# Patient Record
Sex: Female | Born: 1955 | Race: White | Hispanic: No | Marital: Married | Smoking: Current every day smoker
Health system: Southern US, Community
[De-identification: ages and names within clinical notes are randomized; demographics above are authoritative.]

## PROBLEM LIST (undated history)

## (undated) DIAGNOSIS — I10 Essential (primary) hypertension: Secondary | ICD-10-CM

## (undated) DIAGNOSIS — I82409 Acute embolism and thrombosis of unspecified deep veins of unspecified lower extremity: Secondary | ICD-10-CM

## (undated) DIAGNOSIS — F32A Depression, unspecified: Secondary | ICD-10-CM

## (undated) DIAGNOSIS — IMO0002 Reserved for concepts with insufficient information to code with codable children: Secondary | ICD-10-CM

## (undated) DIAGNOSIS — E079 Disorder of thyroid, unspecified: Secondary | ICD-10-CM

## (undated) DIAGNOSIS — I6529 Occlusion and stenosis of unspecified carotid artery: Secondary | ICD-10-CM

## (undated) DIAGNOSIS — F329 Major depressive disorder, single episode, unspecified: Secondary | ICD-10-CM

## (undated) HISTORY — DX: Acute embolism and thrombosis of unspecified deep veins of unspecified lower extremity: I82.409

## (undated) HISTORY — PX: DILATION AND CURETTAGE OF UTERUS: SHX78

## (undated) HISTORY — DX: Occlusion and stenosis of unspecified carotid artery: I65.29

---

## 2000-10-21 ENCOUNTER — Ambulatory Visit (HOSPITAL_COMMUNITY): Admission: RE | Admit: 2000-10-21 | Discharge: 2000-10-21 | Payer: Self-pay | Admitting: *Deleted

## 2001-03-05 ENCOUNTER — Emergency Department (HOSPITAL_COMMUNITY): Admission: EM | Admit: 2001-03-05 | Discharge: 2001-03-05 | Payer: Self-pay | Admitting: Emergency Medicine

## 2001-03-10 ENCOUNTER — Ambulatory Visit (HOSPITAL_COMMUNITY): Admission: RE | Admit: 2001-03-10 | Discharge: 2001-03-10 | Payer: Self-pay | Admitting: Cardiology

## 2001-05-21 ENCOUNTER — Ambulatory Visit (HOSPITAL_COMMUNITY): Admission: RE | Admit: 2001-05-21 | Discharge: 2001-05-21 | Payer: Self-pay

## 2001-10-06 ENCOUNTER — Ambulatory Visit (HOSPITAL_COMMUNITY): Admission: RE | Admit: 2001-10-06 | Discharge: 2001-10-06 | Payer: Self-pay | Admitting: Pulmonary Disease

## 2002-03-16 ENCOUNTER — Ambulatory Visit (HOSPITAL_COMMUNITY): Admission: RE | Admit: 2002-03-16 | Discharge: 2002-03-16 | Payer: Self-pay | Admitting: Pulmonary Disease

## 2002-03-25 ENCOUNTER — Encounter (HOSPITAL_COMMUNITY): Admission: RE | Admit: 2002-03-25 | Discharge: 2002-04-24 | Payer: Self-pay | Admitting: Pulmonary Disease

## 2003-01-22 ENCOUNTER — Ambulatory Visit (HOSPITAL_COMMUNITY): Admission: RE | Admit: 2003-01-22 | Discharge: 2003-01-22 | Payer: Self-pay | Admitting: Pulmonary Disease

## 2003-01-23 HISTORY — PX: SHOULDER ARTHROSCOPY: SHX128

## 2003-02-08 ENCOUNTER — Ambulatory Visit (HOSPITAL_COMMUNITY): Admission: RE | Admit: 2003-02-08 | Discharge: 2003-02-08 | Payer: Self-pay | Admitting: Orthopedic Surgery

## 2003-03-03 ENCOUNTER — Ambulatory Visit (HOSPITAL_COMMUNITY): Admission: RE | Admit: 2003-03-03 | Discharge: 2003-03-03 | Payer: Self-pay | Admitting: Orthopedic Surgery

## 2003-05-25 ENCOUNTER — Ambulatory Visit (HOSPITAL_COMMUNITY): Admission: RE | Admit: 2003-05-25 | Discharge: 2003-05-25 | Payer: Self-pay | Admitting: Orthopedic Surgery

## 2003-05-28 ENCOUNTER — Encounter (HOSPITAL_COMMUNITY): Admission: RE | Admit: 2003-05-28 | Discharge: 2003-06-27 | Payer: Self-pay | Admitting: Orthopedic Surgery

## 2003-07-01 ENCOUNTER — Encounter (HOSPITAL_COMMUNITY): Admission: RE | Admit: 2003-07-01 | Discharge: 2003-07-31 | Payer: Self-pay | Admitting: Orthopedic Surgery

## 2004-07-10 ENCOUNTER — Ambulatory Visit: Payer: Self-pay | Admitting: Orthopedic Surgery

## 2005-04-30 ENCOUNTER — Ambulatory Visit (HOSPITAL_COMMUNITY): Admission: RE | Admit: 2005-04-30 | Discharge: 2005-04-30 | Payer: Self-pay | Admitting: Internal Medicine

## 2005-11-06 ENCOUNTER — Ambulatory Visit: Payer: Self-pay | Admitting: Gastroenterology

## 2005-11-06 ENCOUNTER — Ambulatory Visit (HOSPITAL_COMMUNITY): Admission: RE | Admit: 2005-11-06 | Discharge: 2005-11-06 | Payer: Self-pay | Admitting: Gastroenterology

## 2006-01-22 DIAGNOSIS — I82409 Acute embolism and thrombosis of unspecified deep veins of unspecified lower extremity: Secondary | ICD-10-CM

## 2006-01-22 HISTORY — DX: Acute embolism and thrombosis of unspecified deep veins of unspecified lower extremity: I82.409

## 2009-11-28 ENCOUNTER — Encounter: Payer: Self-pay | Admitting: Orthopedic Surgery

## 2010-02-21 NOTE — Letter (Signed)
Summary: Medical records request Disability Determ  Medical records request Disability Determ   Imported By: Ihor Austin 12/21/2009 11:43:42  _____________________________________________________________________  External Attachment:    Type:   Image     Comment:   External Document

## 2011-01-18 ENCOUNTER — Other Ambulatory Visit (HOSPITAL_COMMUNITY): Payer: Self-pay | Admitting: Internal Medicine

## 2011-01-18 DIAGNOSIS — R319 Hematuria, unspecified: Secondary | ICD-10-CM

## 2011-01-22 ENCOUNTER — Other Ambulatory Visit (HOSPITAL_COMMUNITY): Payer: Self-pay

## 2011-04-03 ENCOUNTER — Ambulatory Visit: Payer: Self-pay | Admitting: Urology

## 2011-05-29 ENCOUNTER — Ambulatory Visit: Payer: Self-pay | Admitting: Urology

## 2013-09-07 ENCOUNTER — Other Ambulatory Visit (HOSPITAL_COMMUNITY): Payer: Self-pay | Admitting: Internal Medicine

## 2013-09-07 DIAGNOSIS — Z86718 Personal history of other venous thrombosis and embolism: Secondary | ICD-10-CM

## 2013-09-07 DIAGNOSIS — Z8679 Personal history of other diseases of the circulatory system: Secondary | ICD-10-CM

## 2013-09-11 ENCOUNTER — Emergency Department (HOSPITAL_COMMUNITY)
Admission: EM | Admit: 2013-09-11 | Discharge: 2013-09-11 | Disposition: A | Discharge status: Home / Self Care | Payer: Medicare Other | Attending: Emergency Medicine | Admitting: Emergency Medicine

## 2013-09-11 ENCOUNTER — Ambulatory Visit (HOSPITAL_COMMUNITY)
Admission: RE | Admit: 2013-09-11 | Discharge: 2013-09-11 | Disposition: A | Discharge status: Home / Self Care | Payer: Medicare Other | Source: Ambulatory Visit | Attending: Internal Medicine | Admitting: Internal Medicine

## 2013-09-11 ENCOUNTER — Encounter (HOSPITAL_COMMUNITY): Payer: Self-pay | Admitting: Emergency Medicine

## 2013-09-11 DIAGNOSIS — Z8489 Family history of other specified conditions: Secondary | ICD-10-CM | POA: Diagnosis not present

## 2013-09-11 DIAGNOSIS — Z8679 Personal history of other diseases of the circulatory system: Secondary | ICD-10-CM

## 2013-09-11 DIAGNOSIS — R109 Unspecified abdominal pain: Secondary | ICD-10-CM | POA: Insufficient documentation

## 2013-09-11 DIAGNOSIS — M51379 Other intervertebral disc degeneration, lumbosacral region without mention of lumbar back pain or lower extremity pain: Secondary | ICD-10-CM | POA: Insufficient documentation

## 2013-09-11 DIAGNOSIS — F3289 Other specified depressive episodes: Secondary | ICD-10-CM | POA: Insufficient documentation

## 2013-09-11 DIAGNOSIS — F329 Major depressive disorder, single episode, unspecified: Secondary | ICD-10-CM | POA: Diagnosis not present

## 2013-09-11 DIAGNOSIS — I10 Essential (primary) hypertension: Secondary | ICD-10-CM | POA: Insufficient documentation

## 2013-09-11 DIAGNOSIS — Z8639 Personal history of other endocrine, nutritional and metabolic disease: Secondary | ICD-10-CM | POA: Diagnosis not present

## 2013-09-11 DIAGNOSIS — Z86718 Personal history of other venous thrombosis and embolism: Secondary | ICD-10-CM

## 2013-09-11 DIAGNOSIS — M79609 Pain in unspecified limb: Secondary | ICD-10-CM | POA: Diagnosis not present

## 2013-09-11 DIAGNOSIS — I6529 Occlusion and stenosis of unspecified carotid artery: Secondary | ICD-10-CM | POA: Diagnosis present

## 2013-09-11 DIAGNOSIS — Z87891 Personal history of nicotine dependence: Secondary | ICD-10-CM | POA: Insufficient documentation

## 2013-09-11 DIAGNOSIS — M5137 Other intervertebral disc degeneration, lumbosacral region: Secondary | ICD-10-CM | POA: Insufficient documentation

## 2013-09-11 DIAGNOSIS — E785 Hyperlipidemia, unspecified: Secondary | ICD-10-CM | POA: Insufficient documentation

## 2013-09-11 DIAGNOSIS — Z792 Long term (current) use of antibiotics: Secondary | ICD-10-CM | POA: Diagnosis not present

## 2013-09-11 DIAGNOSIS — Z79899 Other long term (current) drug therapy: Secondary | ICD-10-CM | POA: Diagnosis not present

## 2013-09-11 DIAGNOSIS — F17200 Nicotine dependence, unspecified, uncomplicated: Secondary | ICD-10-CM | POA: Diagnosis not present

## 2013-09-11 DIAGNOSIS — M51369 Other intervertebral disc degeneration, lumbar region without mention of lumbar back pain or lower extremity pain: Secondary | ICD-10-CM

## 2013-09-11 DIAGNOSIS — I7090 Unspecified atherosclerosis: Secondary | ICD-10-CM | POA: Diagnosis not present

## 2013-09-11 DIAGNOSIS — M5136 Other intervertebral disc degeneration, lumbar region: Secondary | ICD-10-CM

## 2013-09-11 DIAGNOSIS — Z862 Personal history of diseases of the blood and blood-forming organs and certain disorders involving the immune mechanism: Secondary | ICD-10-CM | POA: Diagnosis not present

## 2013-09-11 DIAGNOSIS — M79652 Pain in left thigh: Secondary | ICD-10-CM

## 2013-09-11 HISTORY — DX: Essential (primary) hypertension: I10

## 2013-09-11 HISTORY — DX: Reserved for concepts with insufficient information to code with codable children: IMO0002

## 2013-09-11 HISTORY — DX: Disorder of thyroid, unspecified: E07.9

## 2013-09-11 HISTORY — DX: Major depressive disorder, single episode, unspecified: F32.9

## 2013-09-11 HISTORY — DX: Depression, unspecified: F32.A

## 2013-09-11 MED ORDER — HYDROCODONE-ACETAMINOPHEN 5-325 MG PO TABS
2.0000 | ORAL_TABLET | Freq: Once | ORAL | Status: AC
  Administered 2013-09-11: 2 via ORAL
  Filled 2013-09-11: qty 2

## 2013-09-11 MED ORDER — DEXAMETHASONE 6 MG PO TABS: ORAL_TABLET | ORAL | Status: DC

## 2013-09-11 MED ORDER — METHOCARBAMOL 500 MG PO TABS: 500.0000 mg | ORAL_TABLET | Freq: Four times a day (QID) | ORAL | Status: AC

## 2013-09-11 MED ORDER — KETOROLAC TROMETHAMINE 10 MG PO TABS
10.0000 mg | ORAL_TABLET | Freq: Once | ORAL | Status: AC
  Administered 2013-09-11: 10 mg via ORAL
  Filled 2013-09-11: qty 1

## 2013-09-11 MED ORDER — DIAZEPAM 5 MG PO TABS
10.0000 mg | ORAL_TABLET | Freq: Once | ORAL | Status: AC
  Administered 2013-09-11: 10 mg via ORAL
  Filled 2013-09-11: qty 2

## 2013-09-11 MED ORDER — DEXAMETHASONE SODIUM PHOSPHATE 4 MG/ML IJ SOLN
8.0000 mg | Freq: Once | INTRAMUSCULAR | Status: AC
  Administered 2013-09-11: 8 mg via INTRAMUSCULAR
  Filled 2013-09-11: qty 2

## 2013-09-11 NOTE — Discharge Instructions (Signed)
Please alternate heat and ice to your lower back. Please use Decadron 2 times daily with food. Please use Robaxin with breakfast, lunch, dinner and at bedtime for fasciculations and muscle spasms. Please continue your hydrocodone for pain if needed. Please continue your meloxicam as ordered by your physician. Please see your physician next week for recheck and evaluation concerning your by and leg area pain.

## 2013-09-11 NOTE — ED Provider Notes (Signed)
Medical screening examination/treatment/procedure(s) were performed by non-physician practitioner and as supervising physician I was immediately available for consultation/collaboration.     Veryl Speak, MD 09/11/13 2236

## 2013-09-11 NOTE — ED Notes (Signed)
Pain to left groin, shooting down left leg starting today.  Denies injury/strenuous exercise.

## 2013-09-11 NOTE — ED Notes (Signed)
Patient with no complaints at this time. Respirations even and unlabored. Skin warm/dry. Discharge instructions reviewed with patient at this time. Patient given opportunity to voice concerns/ask questions. Patient discharged at this time and left Emergency Department with steady gait.   

## 2013-09-11 NOTE — ED Provider Notes (Signed)
CSN: [id number]     Arrival date & time 09/11/13  1448 History   First MD Initiated Contact with Patient 09/11/13 1609     Chief Complaint  Patient presents with  . Groin Pain     (Consider location/radiation/quality/duration/timing/severity/associated sxs/prior Treatment) HPI Comments: Patient is a 58 year old female who presents to the emergency department with left groin pain. The patient states that she has a history of degenerative disc disease involving her back. She states that she has fasciculations on the sides of her legs from time to time. Today she had ultrasounds done of her neck and also of her abdomen. She states that after she got home she started having severe pain in the left groin that was shooting down the left leg. She denies any recent injury or trauma to the groin or to the leg or to the back. She's not had any high fevers. She's not had any loss of bowel or bladder function to be reported. There has been no recent falls. The patient states that she had hydrocodone earlier during the day as well as Goody powders, and these did not help. She presents to the emergency department for additional evaluation of these problems.  Patient is a 58 y.o. female presenting with groin pain. The history is provided by the patient.  Groin Pain This is a new problem. Associated symptoms include arthralgias. Pertinent negatives include no abdominal pain, chest pain, coughing or neck pain.    Past Medical History  Diagnosis Date  . Hypertension   . Thyroid disease   . DDD (degenerative disc disease)   . Depression    Past Surgical History  Procedure Laterality Date  . Cesarean section    . Dilation and curettage of uterus     No family history on file. History  Substance Use Topics  . Smoking status: Current Every Day Smoker    Types: Cigarettes  . Smokeless tobacco: Not on file  . Alcohol Use: No   OB History   Grav Para Term Preterm Abortions TAB SAB Ect Mult Living                  Review of Systems  Constitutional: Negative for activity change.       All ROS Neg except as noted in HPI  HENT: Negative for nosebleeds.   Eyes: Negative for photophobia and discharge.  Respiratory: Negative for cough, shortness of breath and wheezing.   Cardiovascular: Negative for chest pain and palpitations.  Gastrointestinal: Negative for abdominal pain and blood in stool.  Genitourinary: Negative for dysuria, frequency and hematuria.  Musculoskeletal: Positive for arthralgias and back pain. Negative for neck pain.  Skin: Negative.   Neurological: Negative for dizziness, seizures and speech difficulty.  Psychiatric/Behavioral: Negative for hallucinations and confusion.      Allergies  Review of patient's allergies indicates no known allergies.  Home Medications   Prior to Admission medications   Medication Sig Start Date End Date Taking? Authorizing Provider  aspirin EC 81 MG tablet Take 81 mg by mouth daily.   Yes Historical Provider, MD  Aspirin-Acetaminophen-Caffeine (GOODY HEADACHE PO) Take 1 packet by mouth as needed (for pain).   Yes Historical Provider, MD  buPROPion (WELLBUTRIN XL) 150 MG 24 hr tablet Take 150 mg by mouth daily.   Yes Historical Provider, MD  diazepam (VALIUM) 10 MG tablet Take 10 mg by mouth every 6 (six) hours as needed for anxiety or sleep.  07/20/13  Yes Historical Provider, MD  HYDROcodone-acetaminophen (  NORCO) 10-325 MG per tablet Take 1 tablet by mouth every 4 (four) hours as needed. For pain 09/07/13  Yes Historical Provider, MD  metoprolol tartrate (LOPRESSOR) 25 MG tablet Take 12.5 mg by mouth daily.   Yes Historical Provider, MD  SYNTHROID 112 MCG tablet Take 112 mcg by mouth daily. 08/11/13  Yes Historical Provider, MD  azithromycin (ZITHROMAX) 250 MG tablet Take 250-500 mg by mouth See admin instructions. Take two tablets on day 1 then take one tablet on days 1 through 5. 09/08/13   Historical Provider, MD   BP 139/88  Pulse 70   Temp(Src) 99.1 F (37.3 C) (Oral)  Resp 18  Ht 5\' 3"  (1.6 m)  Wt 123 lb (55.792 kg)  BMI 21.79 kg/m2  SpO2 100% Physical Exam  Nursing note and vitals reviewed. Constitutional: She is oriented to person, place, and time. She appears well-developed and well-nourished.  Non-toxic appearance.  HENT:  Head: Normocephalic.  Right Ear: Tympanic membrane and external ear normal.  Left Ear: Tympanic membrane and external ear normal.  Eyes: EOM and lids are normal. Pupils are equal, round, and reactive to light.  Neck: Normal range of motion. Neck supple. Carotid bruit is not present.  Cardiovascular: Normal rate, regular rhythm, normal heart sounds, intact distal pulses and normal pulses.   Pulmonary/Chest: Breath sounds normal. No respiratory distress.  Abdominal: Soft. Bowel sounds are normal. There is no tenderness. There is no guarding.  Musculoskeletal: Normal range of motion.  There is pain of the lumbar spine to palpation and with change of positions. No hot areas appreciated.  There is soreness with attempted range of motion of the left hip. There is crepitus with range of motion of right and left knees. The dorsalis pedis pulses 2+. The capillary refill is less than 2 seconds bilaterally. There is a negative Homans sign. There no temperature changes from the toes to the thigh area.  Lymphadenopathy:       Head (right side): No submandibular adenopathy present.       Head (left side): No submandibular adenopathy present.    She has no cervical adenopathy.  Neurological: She is alert and oriented to person, place, and time. She has normal strength. No cranial nerve deficit or sensory deficit.  Gait is steady. There is no evidence of foot drop. No motor or sensory deficits appreciated of the lower extremities.  Skin: Skin is warm and dry.  Psychiatric: She has a normal mood and affect. Her speech is normal.    ED Course  Procedures (including critical care time) Labs Review Labs  Reviewed - No data to display  Imaging Review US Carotid Bilateral  09/11/2013   CLINICAL DATA:  Carotid artery disease, hypertension, hyperlipidemia and tobacco use.  EXAM: BILATERAL CAROTID DUPLEX ULTRASOUND  TECHNIQUE: Pearline Cables scale imaging, color Doppler and duplex ultrasound were performed of bilateral carotid and vertebral arteries in the neck.  COMPARISON:  None.  FINDINGS: Criteria: Quantification of carotid stenosis is based on velocity parameters that correlate the residual internal carotid diameter with NASCET-based stenosis levels, using the diameter of the distal internal carotid lumen as the denominator for stenosis measurement.  The following velocity measurements were obtained:  RIGHT  ICA:  139/44 cm/sec  CCA:  123XX123 cm/sec  SYSTOLIC ICA/CCA RATIO:  2.1  DIASTOLIC ICA/CCA RATIO:  2.0  ECA:  80 cm/sec  LEFT  ICA:  135/44 cm/sec  CCA:  [id number] cm/sec  SYSTOLIC ICA/CCA RATIO:  2.0  DIASTOLIC ICA/CCA RATIO:  1.4  ECA:  126 cm/sec  RIGHT CAROTID ARTERY: Moderate plaque present at the level of the distal common carotid artery, carotid bulb and proximal ICA. Common carotid and carotid bulb plaque is largely noncalcified and ICA plaque is predominantly calcified. There is some velocity elevation in the proximal ICA with spectral broadening present consistent with turbulence. Estimated right ICA stenosis is 50- 69%.  RIGHT VERTEBRAL ARTERY: Antegrade flow with normal waveform and velocity.  LEFT CAROTID ARTERY: Moderate partially calcified plaque is present at the level of the distal common carotid artery, carotid bulb and proximal ICA. Proximal ICA velocities are mildly elevated and there is evidence of turbulent flow and spectral broadening. Estimated left ICA stenosis is 50- 69%.  LEFT VERTEBRAL ARTERY: Antegrade flow with normal waveform and velocity.  IMPRESSION: Moderate atherosclerotic plaque at both carotid bifurcations. Estimated bilateral ICA stenoses are 50- 69%.   Electronically Signed   By: Aletta Edouard M.D.   On: 09/11/2013 10:34   US Aorta  09/11/2013   CLINICAL DATA:  History of smoking, hypertension, hyperlipidemia, carotid artery disease, familial history of abdominal aortic aneurysm.  EXAM: ULTRASOUND OF ABDOMINAL AORTA  TECHNIQUE: Ultrasound examination of the abdominal aorta was performed to evaluate for abdominal aortic aneurysm.  COMPARISON:  None.  FINDINGS: Abdominal Aorta  There is a minimal amount of eccentric mixed echogenic atherosclerotic plaque within a normal caliber abdominal aorta with measurements as follows:  Proximal:  1.9 x 2.3 cm.  Mid:  1.5 x 1.7 cm.  Distal:  1.6 x 1.3 cm.  The bilateral common iliac arteries of of normal size, the right measuring approximately 1.2 cm in maximal diameter, the left measuring approximately 1.0 cm.  IMPRESSION: Minimal amount of atherosclerotic plaque within a normal caliber abdominal aorta.   Electronically Signed   By: Sandi Mariscal M.D.   On: 09/11/2013 09:50     EKG Interpretation None      MDM I have reviewed the ultrasound of the abdominal aorta as well as the carotid arteries. There is bilateral atherosclerotic plaque at the carotid bifurcations. There is 50-69% estimated stenosis present.  There is a small amount of atherosclerotic plaque in the abdominal aorta, but no evidence of aneurysmal changes.  Temperature is 99.1, otherwise the vital signs are well within normal limits. The pulse oximetry is 100% on room air.  There is no edema present. No hot joints appreciated. Is no evidence for any dislocations. No evidence for any troubles. I suspect the patient is having an exacerbation of her degenerative disc disease. The patient be treated in the emergency department with Toradol, diazepam, and Decadron. The patient has hydrocodone at home, will add muscle relaxant to this. The patient has anti-inflammatory medications but she is taking at home already. Patient strongly advised to see her primary physician on next week.     Final diagnoses:  None    *I have reviewed nursing notes, vital signs, and all appropriate lab and imaging results for this patient.Lenox Ahr, PA-C 09/11/13 1644

## 2013-10-26 ENCOUNTER — Encounter: Payer: Self-pay | Admitting: Vascular Surgery

## 2013-10-27 ENCOUNTER — Encounter: Payer: Self-pay | Admitting: Vascular Surgery

## 2013-10-27 ENCOUNTER — Ambulatory Visit (INDEPENDENT_AMBULATORY_CARE_PROVIDER_SITE_OTHER): Payer: Medicare Other | Admitting: Vascular Surgery

## 2013-10-27 VITALS — BP 133/81 | HR 74 | Resp 18 | Ht 62.25 in | Wt 127.5 lb

## 2013-10-27 DIAGNOSIS — I6529 Occlusion and stenosis of unspecified carotid artery: Secondary | ICD-10-CM

## 2013-10-27 NOTE — Progress Notes (Signed)
Patient name: Samantha Osborne MRN: [id number] DOB: 12-Jul-1955 Sex: female   Referred by: Gerarda Fraction  Reason for referral:  Chief Complaint  Patient presents with  . New Evaluation    referred by Dr. Gerarda Fraction  . Carotid    HISTORY OF PRESENT ILLNESS: Patient is a very pleasant 58 year old female seen today for discussion of asymptomatic carotid stenosis. She underwent a lifeline screening certainly testing some stenosis. He recently underwent repeat study on 09/11/2013 and is here today for discussion of this. She specifically denies any episodes of amaurosis fugax, transient ischemic attack or stroke. He does have severe history of premature cardiac disease with a brother dying in his 69s and mother dying at stroke in her late 76s. She has no history of coronary artery disease. Does have a history of DVT. She unfortunately to smoke 1 pack cigarettes per day. She is here today with her husband  Past Medical History  Diagnosis Date  . Hypertension   . Thyroid disease   . DDD (degenerative disc disease)   . Depression   . Carotid artery occlusion   . DVT (deep venous thrombosis) 2008    right leg    Past Surgical History  Procedure Laterality Date  . Cesarean section    . Dilation and curettage of uterus    . Shoulder arthroscopy Left 2005    History   Social History  . Marital Status: Married    Spouse Name: N/A    Number of Children: N/A  . Years of Education: N/A   Occupational History  . Not on file.   Social History Main Topics  . Smoking status: Current Every Day Smoker -- 1.00 packs/day    Types: Cigarettes  . Smokeless tobacco: Not on file  . Alcohol Use: No  . Drug Use: No  . Sexual Activity: Not on file   Other Topics Concern  . Not on file   Social History Narrative  . No narrative on file    Family History  Problem Relation Age of Onset  . Heart disease Mother   . Hyperlipidemia Mother   . Heart attack Mother   . AAA (abdominal aortic aneurysm)  Mother   . Cancer Father   . Heart disease Brother   . Hypertension Brother   . Hypertension Son     Allergies as of 10/27/2013  . (No Known Allergies)    Current Outpatient Prescriptions on File Prior to Visit  Medication Sig Dispense Refill  . aspirin EC 81 MG tablet Take 81 mg by mouth daily.      . Aspirin-Acetaminophen-Caffeine (GOODY HEADACHE PO) Take 1 packet by mouth as needed (for pain).      Marland Kitchen buPROPion (WELLBUTRIN XL) 150 MG 24 hr tablet Take 150 mg by mouth daily.      . diazepam (VALIUM) 10 MG tablet Take 10 mg by mouth every 6 (six) hours as needed for anxiety or sleep.       Marland Kitchen HYDROcodone-acetaminophen (NORCO) 10-325 MG per tablet Take 1 tablet by mouth every 4 (four) hours as needed. For pain      . methocarbamol (ROBAXIN) 500 MG tablet Take 1 tablet (500 mg total) by mouth 4 (four) times daily.  28 tablet  0  . metoprolol tartrate (LOPRESSOR) 25 MG tablet Take 12.5 mg by mouth daily.      Marland Kitchen SYNTHROID 112 MCG tablet Take 112 mcg by mouth daily.      Marland Kitchen azithromycin (ZITHROMAX) 250 MG  tablet Take 250-500 mg by mouth See admin instructions. Take two tablets on day 1 then take one tablet on days 1 through 5.      . dexamethasone (DECADRON) 6 MG tablet 1 po bid with food  12 tablet  0   No current facility-administered medications on file prior to visit.     REVIEW OF SYSTEMS:  Positives indicated with an "X"  CARDIOVASCULAR:  [ ]  chest pain   [ ]  chest pressure   [ ]  palpitations   [ ]  orthopnea   [ ]  dyspnea on exertion   [ ]  claudication   [ ]  rest pain   [x ] DVT   [ ]  phlebitis PULMONARY:   [ ]  productive cough   [ ]  asthma   [ ]  wheezing NEUROLOGIC:   [x ] weakness  [x ] paresthesias  [ ]  aphasia  [ ]  amaurosis  [x ] dizziness HEMATOLOGIC:   [ ]  bleeding problems   [ ]  clotting disorders MUSCULOSKELETAL:  [ ]  joint pain   [ ]  joint swelling GASTROINTESTINAL: [ ]   blood in stool  [ ]   hematemesis GENITOURINARY:  [ ]   dysuria  [x ]  hematuria PSYCHIATRIC:  [ ]   history of major depression INTEGUMENTARY:  [ ]  rashes  [ ]  ulcers CONSTITUTIONAL:  [ ]  fever   [ ]  chills  PHYSICAL EXAMINATION:  General: The patient is a well-nourished female, in no acute distress. Vital signs are BP 133/81  Pulse 74  Resp 18  Ht 5' 2.25" (1.581 m)  Wt 127 lb 8 oz (57.834 kg)  BMI 23.14 kg/m2 Pulmonary: There is a good air exchange bilaterally without wheezing or rales. Abdomen: Soft and non-tender  Musculoskeletal: There are no major deformities.  There is no significant extremity pain. Neurologic: No focal weakness or paresthesias are detected, Skin: There are no ulcer or rashes noted. Psychiatric: The patient has normal affect. Cardiovascular: There is a regular rate and rhythm without significant murmur appreciated. Carotid arteries without bruits bilaterally Pulse status: 2+ radial femoral and dorsalis pedis pulses bilaterally   Vascular Lab Studies:  Carotid duplex from 09/11/2013 was reviewed. This does suggest bilateral 50-69% stenosis. Antegrade flow in the vertebral arteries bilaterally  Impression and Plan:  Had a long discussion with the patient and her husband present. I did explain that this is the lower end of the scale related to her velocities in the internal carotid arteries bilaterally.-As the posterior at no increased risk for stroke currently. I did recommend a followup in 6 months to rule out any progression of stenosis. I also explained to critical importance of absolute cigarette smoking cessation. Explained about spondylitis was removed correctable risk factor particularly in light of her strong family history. She will attempt this. We will see her again in 6 months. I did review signs of carotid disease symptoms and she will notify should this occur.    Django Nguyen [facility] Office: [phone]

## 2013-10-28 ENCOUNTER — Other Ambulatory Visit: Payer: Self-pay | Admitting: *Deleted

## 2013-10-28 DIAGNOSIS — I6523 Occlusion and stenosis of bilateral carotid arteries: Secondary | ICD-10-CM

## 2014-05-04 ENCOUNTER — Ambulatory Visit: Payer: Medicare Other | Admitting: Vascular Surgery

## 2014-05-04 ENCOUNTER — Other Ambulatory Visit (HOSPITAL_COMMUNITY): Payer: Medicare Other

## 2014-05-18 ENCOUNTER — Ambulatory Visit: Payer: Self-pay | Admitting: Vascular Surgery

## 2014-05-18 ENCOUNTER — Other Ambulatory Visit (HOSPITAL_COMMUNITY): Payer: Self-pay

## 2014-06-18 ENCOUNTER — Encounter: Payer: Self-pay | Admitting: Vascular Surgery

## 2014-06-22 ENCOUNTER — Ambulatory Visit (INDEPENDENT_AMBULATORY_CARE_PROVIDER_SITE_OTHER): Payer: Medicare Other | Admitting: Vascular Surgery

## 2014-06-22 ENCOUNTER — Other Ambulatory Visit: Payer: Self-pay | Admitting: Vascular Surgery

## 2014-06-22 ENCOUNTER — Encounter: Payer: Self-pay | Admitting: Vascular Surgery

## 2014-06-22 ENCOUNTER — Ambulatory Visit (HOSPITAL_COMMUNITY)
Admission: RE | Admit: 2014-06-22 | Discharge: 2014-06-22 | Disposition: A | Discharge status: Home / Self Care | Payer: Medicare Other | Source: Ambulatory Visit | Attending: Vascular Surgery | Admitting: Vascular Surgery

## 2014-06-22 VITALS — BP 139/71 | HR 74 | Ht 62.5 in | Wt 127.0 lb

## 2014-06-22 DIAGNOSIS — I6529 Occlusion and stenosis of unspecified carotid artery: Secondary | ICD-10-CM | POA: Diagnosis not present

## 2014-06-22 DIAGNOSIS — I6523 Occlusion and stenosis of bilateral carotid arteries: Secondary | ICD-10-CM | POA: Diagnosis not present

## 2014-06-22 NOTE — Addendum Note (Signed)
Addended by: Mena Goes on: 06/22/2014 05:03 PM   Modules accepted: Orders

## 2014-06-22 NOTE — Progress Notes (Signed)
Patient resents today for follow-up of asymptomatic carotid stenosis. She is quite anxious regarding this. She feels that she may be more sleepy than typical this may be related. Explained that this would not cause sleepiness from her moderate carotid disease. She does have no symptoms of focal neurologic deficits. Specifically no amaurosis fugax, transient ischemic attack or stroke.  Unfortunately she continues to smoke. Again stressed the importance of smoking cessation.  Past Medical History  Diagnosis Date  . Hypertension   . Thyroid disease   . DDD (degenerative disc disease)   . Depression   . Carotid artery occlusion   . DVT (deep venous thrombosis) 2008    right leg    History  Substance Use Topics  . Smoking status: Current Every Day Smoker -- 1.00 packs/day    Types: Cigarettes  . Smokeless tobacco: Not on file  . Alcohol Use: No    Family History  Problem Relation Age of Onset  . Heart disease Mother   . Hyperlipidemia Mother   . Heart attack Mother   . AAA (abdominal aortic aneurysm) Mother   . Cancer Father   . Heart disease Brother   . Hypertension Brother   . Hypertension Son     No Known Allergies   Current outpatient prescriptions:  .  aspirin EC 81 MG tablet, Take 81 mg by mouth daily., Disp: , Rfl:  .  Aspirin-Acetaminophen-Caffeine (GOODY HEADACHE PO), Take 1 packet by mouth as needed (for pain)., Disp: , Rfl:  .  buPROPion (WELLBUTRIN XL) 150 MG 24 hr tablet, Take 150 mg by mouth daily., Disp: , Rfl:  .  diazepam (VALIUM) 10 MG tablet, Take 10 mg by mouth every 6 (six) hours as needed for anxiety or sleep. , Disp: , Rfl:  .  HYDROcodone-acetaminophen (NORCO) 10-325 MG per tablet, Take 1 tablet by mouth every 4 (four) hours as needed. For pain, Disp: , Rfl:  .  methocarbamol (ROBAXIN) 500 MG tablet, Take 1 tablet (500 mg total) by mouth 4 (four) times daily., Disp: 28 tablet, Rfl: 0 .  metoprolol tartrate (LOPRESSOR) 25 MG tablet, Take 25 mg by mouth  daily. , Disp: , Rfl:  .  SYNTHROID 112 MCG tablet, Take 112 mcg by mouth daily., Disp: , Rfl:  .  azithromycin (ZITHROMAX) 250 MG tablet, Take 250-500 mg by mouth See admin instructions. Take two tablets on day 1 then take one tablet on days 1 through 5., Disp: , Rfl:  .  dexamethasone (DECADRON) 6 MG tablet, 1 po bid with food (Patient not taking: Reported on 06/22/2014), Disp: 12 tablet, Rfl: 0  Filed Vitals:   06/22/14 1535 06/22/14 1539  BP: 146/80 139/71  Pulse: 74   Height: 5' 2.5" (1.588 m)   Weight: 127 lb (57.607 kg)   SpO2: 98%     Body mass index is 22.84 kg/(m^2).        Physical exam well-developed well-nourished female no acute distress  Grossly intact neurologically  Heart regular rate and rhythm  Carotid arteries without bruits bilaterally   She did undergo repeat  Carotid duplex in our office today. I discussed this with the patient and her husband present. This shows no change in the moderate stenosis in both internal carotid arteries. Her carotid Doppler velocities suggest 50-69% stenosis which is certainly at the lower end of this range.   Again reviewed the risk factors for increased atherosclerotic change. Explained cigarette smoking certainly must report one of these. Also explained the hypertension control  and lipid management. She once to quit smoking and continue to work towards this. We will see her again in one year with repeat carotid duplex

## 2014-09-28 ENCOUNTER — Other Ambulatory Visit (HOSPITAL_COMMUNITY): Payer: Self-pay | Admitting: Internal Medicine

## 2014-09-28 DIAGNOSIS — Z1231 Encounter for screening mammogram for malignant neoplasm of breast: Secondary | ICD-10-CM

## 2014-10-04 ENCOUNTER — Ambulatory Visit (HOSPITAL_COMMUNITY): Payer: Medicare Other

## 2014-10-14 ENCOUNTER — Ambulatory Visit (HOSPITAL_COMMUNITY)
Admission: RE | Admit: 2014-10-14 | Discharge: 2014-10-14 | Disposition: A | Discharge status: Home / Self Care | Payer: Medicare Other | Source: Ambulatory Visit | Attending: Internal Medicine | Admitting: Internal Medicine

## 2014-10-14 DIAGNOSIS — Z1231 Encounter for screening mammogram for malignant neoplasm of breast: Secondary | ICD-10-CM | POA: Insufficient documentation

## 2014-12-01 ENCOUNTER — Ambulatory Visit: Payer: Self-pay | Admitting: "Endocrinology

## 2014-12-03 ENCOUNTER — Encounter: Payer: Self-pay | Admitting: "Endocrinology

## 2014-12-03 ENCOUNTER — Ambulatory Visit (INDEPENDENT_AMBULATORY_CARE_PROVIDER_SITE_OTHER): Payer: Medicare Other | Admitting: "Endocrinology

## 2014-12-03 VITALS — BP 131/81 | HR 79 | Ht 62.5 in | Wt 130.0 lb

## 2014-12-03 DIAGNOSIS — E038 Other specified hypothyroidism: Secondary | ICD-10-CM | POA: Diagnosis not present

## 2014-12-03 DIAGNOSIS — E2740 Unspecified adrenocortical insufficiency: Secondary | ICD-10-CM

## 2014-12-04 NOTE — Progress Notes (Signed)
Subjective:    Patient ID: Samantha Osborne, female    DOB: May 01, 1955,    Past Medical History  Diagnosis Date  . Hypertension   . Thyroid disease   . DDD (degenerative disc disease)   . Depression   . Carotid artery occlusion   . DVT (deep venous thrombosis) ([place]) 2008    right leg   Past Surgical History  Procedure Laterality Date  . Cesarean section    . Dilation and curettage of uterus    . Shoulder arthroscopy Left 2005   Social History   Social History  . Marital Status: Married    Spouse Name: N/A  . Number of Children: N/A  . Years of Education: N/A   Social History Main Topics  . Smoking status: Current Every Day Smoker -- 1.00 packs/day    Types: Cigarettes  . Smokeless tobacco: None  . Alcohol Use: No  . Drug Use: No  . Sexual Activity: Not Asked   Other Topics Concern  . None   Social History Narrative   Outpatient Encounter Prescriptions as of 12/03/2014  Medication Sig  . aspirin EC 81 MG tablet Take 81 mg by mouth daily.  Marland Kitchen buPROPion (WELLBUTRIN XL) 150 MG 24 hr tablet Take 150 mg by mouth daily.  . diazepam (VALIUM) 10 MG tablet Take 10 mg by mouth every 6 (six) hours as needed for anxiety or sleep.   Marland Kitchen HYDROcodone-acetaminophen (NORCO) 10-325 MG per tablet Take 1 tablet by mouth every 4 (four) hours as needed. For pain  . methocarbamol (ROBAXIN) 500 MG tablet Take 1 tablet (500 mg total) by mouth 4 (four) times daily.  . metoprolol tartrate (LOPRESSOR) 25 MG tablet Take 25 mg by mouth daily.   . Omega-3 Fatty Acids (FISH OIL) 1000 MG CAPS Take by mouth daily.  Marland Kitchen SYNTHROID 112 MCG tablet Take 112 mcg by mouth daily.  . Aspirin-Acetaminophen-Caffeine (GOODY HEADACHE PO) Take 1 packet by mouth as needed (for pain).  Marland Kitchen azithromycin (ZITHROMAX) 250 MG tablet Take 250-500 mg by mouth See admin instructions. Take two tablets on day 1 then take one tablet on days 1 through 5.  . dexamethasone (DECADRON) 6 MG tablet 1 po bid with food (Patient not  taking: Reported on 06/22/2014)   No facility-administered encounter medications on file as of 12/03/2014.   ALLERGIES: No Known Allergies VACCINATION STATUS:  There is no immunization history on file for this patient.  HPI Samantha Osborne is a 58 year old female patient with medical history as above. She is being seen in consultation for hypo-cortisolemia requested by Dr. Gerarda Fraction. She gives a history of chronic exposure to steroids for various reasons including arthritis, at one point as high as Decadron 6 mg by mouth daily. She also has history of hyperthyroidism treated with RAI approximately 20 years ago currently on Synthroid 112 g by mouth every morning. She is a chronic smoker smoked for 42 years with approximate pack year of 12. She denies history of adrenal insufficiency. She was complaining of weight gain fatigue and hair loss when she underwent lab work which showed suboptimal a.m. cortisol of 4.5 (normal 6.2-19.4). She denies nausea vomiting, and abdominal pain. Her last exposure for steroids she says was 6 months ago.  Review of Systems   Constitutional: +weight gain, + fatigue, no subjective hyperthermia/hypothermia Eyes: no blurry vision, no xerophthalmia ENT: no sore throat, no nodules palpated in throat, no dysphagia/odynophagia, no hoarseness Cardiovascular: no CP/SOB/palpitations/leg swelling Respiratory: no cough/SOB Gastrointestinal: no N/V/D/C  Musculoskeletal: no muscle/joint aches Skin: no rashes Neurological: no tremors/numbness/tingling/dizziness Psychiatric: no depression/anxiety  Objective:    BP 131/81 mmHg  Pulse 79  Ht 5' 2.5" (1.588 m)  Wt 130 lb (58.968 kg)  BMI 23.38 kg/m2  SpO2 96%  Wt Readings from Last 3 Encounters:  12/03/14 130 lb (58.968 kg)  06/22/14 127 lb (57.607 kg)  10/27/13 127 lb 8 oz (57.834 kg)    Physical Exam   Constitutional: overweight, in NAD Eyes: PERRLA, EOMI, no exophthalmos ENT: moist mucous membranes, no thyromegaly,  no cervical lymphadenopathy Cardiovascular: RRR, No MRG Respiratory: CTA B Gastrointestinal: abdomen soft, NT, ND, BS+ Musculoskeletal: no deformities, strength intact in all 4 Skin: moist, warm, no rashes Neurological: no tremor with outstretched hands, DTR normal in all 4  On 10/04/2014 her ACTH was low at 6. 3 AM cortisol 4. 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 Her intermittent exposure to high-dose steroids, her chronic use of opioids for pain control puts her at risk of acquired adrenal insufficiency. Her history of hyperthyroidism which required radioactive iodine therapy 20 years ago may indicate autoimmune dysfunction with a risk of primary adrenal insufficiency. -She needs more confirmatory workup. I will start  with ACTH stimulation  Test . -I have counseled patient to consider smoking cessation to help her decrease the risk of COPD which may require more steroid exposure.  2. 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 She is euthyroid on 112 g of Synthroid. I advised her to continue on the same.   - We discussed about correct intake of levothyroxine, at fasting, with water, separated by at least 30 minutes from breakfast, and separated by more than 4 hours from calcium, iron, multivitamins, acid reflux medications (PPIs). -Patient is made aware of the fact that thyroid hormone replacement is needed for life, dose to be adjusted by periodic monitoring of thyroid function tests.  I advised patient to maintain close follow up with their PCP for primary care needs. Follow up plan: Return in about 10 days (around 12/13/2014) for adrenal insufficiency, follow up with pre-visit labs.  Glade Lloyd, MD Phone: [phone]   Fax: [phone]   12/04/2014, 7:58 AM

## 2014-12-10 ENCOUNTER — Encounter (HOSPITAL_COMMUNITY): Payer: Self-pay

## 2014-12-10 ENCOUNTER — Encounter (HOSPITAL_COMMUNITY)
Admission: RE | Admit: 2014-12-10 | Discharge: 2014-12-10 | Disposition: A | Discharge status: Home / Self Care | Payer: Medicare Other | Source: Ambulatory Visit | Attending: "Endocrinology | Admitting: "Endocrinology

## 2014-12-10 DIAGNOSIS — E2740 Unspecified adrenocortical insufficiency: Secondary | ICD-10-CM | POA: Insufficient documentation

## 2014-12-10 LAB — ACTH STIMULATION, 3 TIME POINTS
CORTISOL 30 MIN: 22.4 ug/dL
CORTISOL BASE: 5.5 ug/dL
Cortisol, 60 Min: 24 ug/dL

## 2014-12-10 MED ORDER — COSYNTROPIN 0.25 MG IJ SOLR
0.2500 mg | Freq: Once | INTRAMUSCULAR | Status: AC
  Administered 2014-12-10: 0.25 mg via INTRAVENOUS
  Filled 2014-12-10: qty 0.25

## 2014-12-10 NOTE — Progress Notes (Signed)
Results for Samantha Osborne, Samantha Osborne (MRN [id number]) as of 12/10/2014 14:28  Ref. Range 12/10/2014 09:29  Cortisol, Base Latest Units: ug/dL 5.5  Cortisol, 30 Min Latest Units: ug/dL 22.4  Cortisol, 60 Min Latest Units: ug/dL 24.0

## 2014-12-20 ENCOUNTER — Encounter: Payer: Self-pay | Admitting: "Endocrinology

## 2014-12-20 ENCOUNTER — Ambulatory Visit (INDEPENDENT_AMBULATORY_CARE_PROVIDER_SITE_OTHER): Payer: Medicare Other | Admitting: "Endocrinology

## 2014-12-20 VITALS — BP 141/84 | HR 86 | Ht 62.0 in | Wt 135.0 lb

## 2014-12-20 DIAGNOSIS — E038 Other specified hypothyroidism: Secondary | ICD-10-CM | POA: Diagnosis not present

## 2014-12-20 DIAGNOSIS — E2740 Unspecified adrenocortical insufficiency: Secondary | ICD-10-CM

## 2014-12-20 NOTE — Progress Notes (Signed)
Subjective:    Patient ID: Samantha Osborne, female    DOB: Dec 03, 1955,    Past Medical History  Diagnosis Date  . Hypertension   . Thyroid disease   . DDD (degenerative disc disease)   . Depression   . Carotid artery occlusion   . DVT (deep venous thrombosis) ([place]) 2008    right leg   Past Surgical History  Procedure Laterality Date  . Cesarean section    . Dilation and curettage of uterus    . Shoulder arthroscopy Left 2005   Social History   Social History  . Marital Status: Married    Spouse Name: N/A  . Number of Children: N/A  . Years of Education: N/A   Social History Main Topics  . Smoking status: Current Every Day Smoker -- 1.00 packs/day    Types: Cigarettes  . Smokeless tobacco: None  . Alcohol Use: No  . Drug Use: No  . Sexual Activity: Not Asked   Other Topics Concern  . None   Social History Narrative   Outpatient Encounter Prescriptions as of 12/20/2014  Medication Sig  . aspirin EC 81 MG tablet Take 81 mg by mouth daily.  . Aspirin-Acetaminophen-Caffeine (GOODY HEADACHE PO) Take 1 packet by mouth as needed (for pain).  Marland Kitchen buPROPion (WELLBUTRIN XL) 150 MG 24 hr tablet Take 150 mg by mouth daily.  . diazepam (VALIUM) 10 MG tablet Take 10 mg by mouth every 6 (six) hours as needed for anxiety or sleep.   Marland Kitchen HYDROcodone-acetaminophen (NORCO) 10-325 MG per tablet Take 1 tablet by mouth every 4 (four) hours as needed. For pain  . methocarbamol (ROBAXIN) 500 MG tablet Take 1 tablet (500 mg total) by mouth 4 (four) times daily.  . metoprolol tartrate (LOPRESSOR) 25 MG tablet Take 25 mg by mouth daily.   . Omega-3 Fatty Acids (FISH OIL) 1000 MG CAPS Take by mouth daily.  Marland Kitchen SYNTHROID 112 MCG tablet Take 112 mcg by mouth daily.  Marland Kitchen azithromycin (ZITHROMAX) 250 MG tablet Take 250-500 mg by mouth See admin instructions. Take two tablets on day 1 then take one tablet on days 1 through 5.  . dexamethasone (DECADRON) 6 MG tablet 1 po bid with food (Patient not  taking: Reported on 06/22/2014)   No facility-administered encounter medications on file as of 12/20/2014.   ALLERGIES: No Known Allergies VACCINATION STATUS:  There is no immunization history on file for this patient.  HPI Samantha Osborne is a 59 year old female patient with medical history as above. She is here to discuss her labs results.  She gives a history of chronic exposure to steroids for various reasons including arthritis, at one point as high as Decadron 6 mg by mouth daily. She also has history of hyperthyroidism treated with RAI approximately 20 years ago currently on Synthroid 112 g by mouth every morning. She is a chronic smoker smoked for 42 years with approximate pack year of 58. She denies history of adrenal insufficiency. She was complaining of weight gain fatigue and hair loss when she underwent lab work which showed suboptimal a.m. cortisol of 4.5 (normal 6.2-19.4). She denies nausea vomiting, and abdominal pain. Her last exposure for steroids she says was 6 months ago. She continues to smoke.   Review of Systems   Constitutional: +weight gain, + fatigue, no subjective hyperthermia/hypothermia Eyes: no blurry vision, no xerophthalmia ENT: no sore throat, no nodules palpated in throat, no dysphagia/odynophagia, no hoarseness Cardiovascular: no CP/SOB/palpitations/leg swelling Respiratory: no cough/SOB Gastrointestinal:  no N/V/D/C Musculoskeletal: no muscle/joint aches Skin: no rashes Neurological: no tremors/numbness/tingling/dizziness Psychiatric: no depression/anxiety  Objective:    BP 141/84 mmHg  Pulse 86  Ht 5\' 2"  (1.575 m)  Wt 135 lb (61.236 kg)  BMI 24.69 kg/m2  SpO2 95%  Wt Readings from Last 3 Encounters:  12/20/14 135 lb (61.236 kg)  12/10/14 130 lb (58.968 kg)  12/03/14 130 lb (58.968 kg)    Physical Exam   Constitutional: overweight, in NAD Eyes: PERRLA, EOMI, no exophthalmos ENT: moist mucous membranes, no thyromegaly, no cervical  lymphadenopathy Cardiovascular: RRR, No MRG Respiratory: CTA B Gastrointestinal: abdomen soft, NT, ND, BS+ Musculoskeletal: no deformities, strength intact in all 4 Skin: moist, warm, no rashes Neurological: no tremor with outstretched hands, DTR normal in all 4  On 10/04/2014 her ACTH was low at 6. 3 AM cortisol 4. 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 The fact that that her baseline cortisol is 5.5 points a possible early secondary adrenal insufficiency, her 30 minutes and 60 minutes cortisol are adequate at above 20. She will not need steroids replacement for now, but needs evaluation at 3 months.  Her intermittent exposure to high-dose steroids, her chronic use of opioids for pain control puts her at moe risk of acquired adrenal insufficiency.  -I have counseled patient to consider smoking cessation to help her decrease the risk of COPD which may require more steroid exposure.  2. 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 She is euthyroid on 112 g of Synthroid. I advised her to continue on the same.   - We discussed about correct intake of levothyroxine, at fasting, with water, separated by at least 30 minutes from breakfast, and separated by more than 4 hours from calcium, iron, multivitamins, acid reflux medications (PPIs). -Patient is made aware of the fact that thyroid  hormone replacement is needed for life, dose to be adjusted by periodic monitoring of thyroid function tests.  I advised patient to maintain close follow up with their PCP for primary care needs. Follow up plan: Return for underactive thyroid, low cortisol. in 3 months.  Glade Lloyd, MD Phone: [phone]  Fax: [phone]   12/20/2014, 6:49 PM

## 2015-02-07 ENCOUNTER — Institutional Professional Consult (permissible substitution): Payer: Medicare Other | Admitting: Neurology

## 2015-02-14 DIAGNOSIS — L814 Other melanin hyperpigmentation: Secondary | ICD-10-CM | POA: Diagnosis not present

## 2015-02-14 DIAGNOSIS — L821 Other seborrheic keratosis: Secondary | ICD-10-CM | POA: Diagnosis not present

## 2015-02-14 DIAGNOSIS — L218 Other seborrheic dermatitis: Secondary | ICD-10-CM | POA: Diagnosis not present

## 2015-02-14 DIAGNOSIS — L308 Other specified dermatitis: Secondary | ICD-10-CM | POA: Diagnosis not present

## 2015-03-14 ENCOUNTER — Other Ambulatory Visit: Payer: Self-pay | Admitting: "Endocrinology

## 2015-03-14 DIAGNOSIS — E2740 Unspecified adrenocortical insufficiency: Secondary | ICD-10-CM | POA: Diagnosis not present

## 2015-03-14 DIAGNOSIS — E038 Other specified hypothyroidism: Secondary | ICD-10-CM | POA: Diagnosis not present

## 2015-03-15 ENCOUNTER — Encounter (HOSPITAL_COMMUNITY): Payer: Self-pay

## 2015-03-15 ENCOUNTER — Emergency Department (HOSPITAL_COMMUNITY): Payer: PPO

## 2015-03-15 ENCOUNTER — Observation Stay (HOSPITAL_COMMUNITY)
Admission: EM | Admit: 2015-03-15 | Discharge: 2015-03-16 | Disposition: A | Discharge status: Home / Self Care | Payer: PPO | Attending: Internal Medicine | Admitting: Internal Medicine

## 2015-03-15 DIAGNOSIS — F419 Anxiety disorder, unspecified: Secondary | ICD-10-CM | POA: Diagnosis not present

## 2015-03-15 DIAGNOSIS — E039 Hypothyroidism, unspecified: Secondary | ICD-10-CM | POA: Diagnosis not present

## 2015-03-15 DIAGNOSIS — K219 Gastro-esophageal reflux disease without esophagitis: Secondary | ICD-10-CM | POA: Diagnosis present

## 2015-03-15 DIAGNOSIS — Z7982 Long term (current) use of aspirin: Secondary | ICD-10-CM | POA: Diagnosis not present

## 2015-03-15 DIAGNOSIS — R0789 Other chest pain: Secondary | ICD-10-CM | POA: Diagnosis present

## 2015-03-15 DIAGNOSIS — F17210 Nicotine dependence, cigarettes, uncomplicated: Secondary | ICD-10-CM | POA: Insufficient documentation

## 2015-03-15 DIAGNOSIS — I6529 Occlusion and stenosis of unspecified carotid artery: Secondary | ICD-10-CM | POA: Diagnosis not present

## 2015-03-15 DIAGNOSIS — F329 Major depressive disorder, single episode, unspecified: Secondary | ICD-10-CM | POA: Diagnosis not present

## 2015-03-15 DIAGNOSIS — E785 Hyperlipidemia, unspecified: Secondary | ICD-10-CM | POA: Diagnosis not present

## 2015-03-15 DIAGNOSIS — R072 Precordial pain: Secondary | ICD-10-CM | POA: Diagnosis not present

## 2015-03-15 DIAGNOSIS — E079 Disorder of thyroid, unspecified: Secondary | ICD-10-CM | POA: Diagnosis not present

## 2015-03-15 DIAGNOSIS — M5030 Other cervical disc degeneration, unspecified cervical region: Secondary | ICD-10-CM | POA: Diagnosis not present

## 2015-03-15 DIAGNOSIS — R079 Chest pain, unspecified: Secondary | ICD-10-CM | POA: Diagnosis present

## 2015-03-15 DIAGNOSIS — Z86718 Personal history of other venous thrombosis and embolism: Secondary | ICD-10-CM | POA: Insufficient documentation

## 2015-03-15 DIAGNOSIS — R51 Headache: Secondary | ICD-10-CM | POA: Insufficient documentation

## 2015-03-15 DIAGNOSIS — I10 Essential (primary) hypertension: Secondary | ICD-10-CM | POA: Insufficient documentation

## 2015-03-15 DIAGNOSIS — I6523 Occlusion and stenosis of bilateral carotid arteries: Secondary | ICD-10-CM | POA: Diagnosis not present

## 2015-03-15 LAB — CBC WITH DIFFERENTIAL/PLATELET
Basophils Absolute: 0 10*3/uL (ref 0.0–0.1)
Basophils Relative: 0 %
EOS ABS: 0.4 10*3/uL (ref 0.0–0.7)
EOS PCT: 4 %
HCT: 39.8 % (ref 36.0–46.0)
HEMOGLOBIN: 13.4 g/dL (ref 12.0–15.0)
LYMPHS ABS: 3.9 10*3/uL (ref 0.7–4.0)
LYMPHS PCT: 37 %
MCH: 31.7 pg (ref 26.0–34.0)
MCHC: 33.7 g/dL (ref 30.0–36.0)
MCV: 94.1 fL (ref 78.0–100.0)
MONOS PCT: 6 %
Monocytes Absolute: 0.7 10*3/uL (ref 0.1–1.0)
NEUTROS PCT: 53 %
Neutro Abs: 5.5 10*3/uL (ref 1.7–7.7)
PLATELETS: 276 10*3/uL (ref 150–400)
RBC: 4.23 MIL/uL (ref 3.87–5.11)
RDW: 13.1 % (ref 11.5–15.5)
WBC: 10.6 10*3/uL — ABNORMAL HIGH (ref 4.0–10.5)

## 2015-03-15 LAB — TROPONIN I: Troponin I: <0.03 ng/mL (ref <0.031)

## 2015-03-15 LAB — BASIC METABOLIC PANEL
ANION GAP: 10 (ref 5–15)
BUN: 18 mg/dL (ref 6–20)
CALCIUM: 9 mg/dL (ref 8.9–10.3)
CO2: 27 mmol/L (ref 22–32)
CREATININE: 1.1 mg/dL — AB (ref 0.44–1.00)
Chloride: 104 mmol/L (ref 101–111)
GFR calc non Af Amer: 53 mL/min — ABNORMAL LOW (ref >60)
Glucose, Bld: 96 mg/dL (ref 65–99)
Potassium: 4.1 mmol/L (ref 3.5–5.1)
Sodium: 141 mmol/L (ref 135–145)

## 2015-03-15 LAB — TSH: TSH: 0.52 u[IU]/mL (ref 0.350–4.500)

## 2015-03-15 MED ORDER — NITROGLYCERIN 2 % TD OINT
1.0000 [in_us] | TOPICAL_OINTMENT | Freq: Once | TRANSDERMAL | Status: AC
  Administered 2015-03-15: 1 [in_us] via TOPICAL
  Filled 2015-03-15: qty 1

## 2015-03-15 MED ORDER — ASPIRIN 325 MG PO TABS
325.0000 mg | ORAL_TABLET | Freq: Every day | ORAL | Status: DC
  Administered 2015-03-16: 325 mg via ORAL
  Filled 2015-03-15: qty 1

## 2015-03-15 MED ORDER — ACETAMINOPHEN 325 MG PO TABS
650.0000 mg | ORAL_TABLET | Freq: Four times a day (QID) | ORAL | Status: DC | PRN
  Administered 2015-03-15 (×2): 650 mg via ORAL
  Filled 2015-03-15 (×2): qty 2

## 2015-03-15 MED ORDER — ASPIRIN 81 MG PO CHEW
324.0000 mg | CHEWABLE_TABLET | Freq: Once | ORAL | Status: AC
  Administered 2015-03-15: 324 mg via ORAL
  Filled 2015-03-15: qty 4

## 2015-03-15 MED ORDER — OMEGA-3-ACID ETHYL ESTERS 1 G PO CAPS
2.0000 g | ORAL_CAPSULE | Freq: Every day | ORAL | Status: DC
  Administered 2015-03-16: 2 g via ORAL
  Filled 2015-03-15: qty 2

## 2015-03-15 MED ORDER — LEVOTHYROXINE SODIUM 112 MCG PO TABS
112.0000 ug | ORAL_TABLET | Freq: Every day | ORAL | Status: DC
  Administered 2015-03-16: 112 ug via ORAL
  Filled 2015-03-15: qty 1

## 2015-03-15 MED ORDER — ONDANSETRON HCL 4 MG PO TABS: 4.0000 mg | ORAL_TABLET | Freq: Four times a day (QID) | ORAL | Status: DC | PRN

## 2015-03-15 MED ORDER — DIAZEPAM 5 MG PO TABS: 10.0000 mg | ORAL_TABLET | Freq: Every evening | ORAL | Status: DC | PRN

## 2015-03-15 MED ORDER — NITROGLYCERIN 2 % TD OINT
1.0000 [in_us] | TOPICAL_OINTMENT | Freq: Three times a day (TID) | TRANSDERMAL | Status: DC
  Filled 2015-03-15: qty 1

## 2015-03-15 MED ORDER — GI COCKTAIL ~~LOC~~
30.0000 mL | Freq: Once | ORAL | Status: AC
  Administered 2015-03-15: 30 mL via ORAL
  Filled 2015-03-15: qty 30

## 2015-03-15 MED ORDER — ONDANSETRON HCL 4 MG/2ML IJ SOLN: 4.0000 mg | Freq: Four times a day (QID) | INTRAMUSCULAR | Status: DC | PRN

## 2015-03-15 MED ORDER — BUPROPION HCL ER (SR) 150 MG PO TB12
150.0000 mg | ORAL_TABLET | Freq: Every day | ORAL | Status: DC
  Administered 2015-03-16: 150 mg via ORAL
  Filled 2015-03-15 (×3): qty 1

## 2015-03-15 MED ORDER — PRAVASTATIN SODIUM 40 MG PO TABS
40.0000 mg | ORAL_TABLET | Freq: Every day | ORAL | Status: DC
  Administered 2015-03-15: 40 mg via ORAL
  Filled 2015-03-15: qty 1

## 2015-03-15 MED ORDER — ENOXAPARIN SODIUM 40 MG/0.4ML ~~LOC~~ SOLN
40.0000 mg | SUBCUTANEOUS | Status: DC
  Administered 2015-03-15: 40 mg via SUBCUTANEOUS
  Filled 2015-03-15: qty 0.4

## 2015-03-15 MED ORDER — ACETAMINOPHEN 650 MG RE SUPP: 650.0000 mg | Freq: Four times a day (QID) | RECTAL | Status: DC | PRN

## 2015-03-15 MED ORDER — SODIUM CHLORIDE 0.9% FLUSH
3.0000 mL | Freq: Two times a day (BID) | INTRAVENOUS | Status: DC
  Administered 2015-03-15 – 2015-03-16 (×2): 3 mL via INTRAVENOUS

## 2015-03-15 MED ORDER — LEVOTHYROXINE SODIUM 112 MCG PO TABS: 112.0000 ug | ORAL_TABLET | Freq: Every day | ORAL | Status: DC

## 2015-03-15 MED ORDER — METOPROLOL TARTRATE 25 MG PO TABS
25.0000 mg | ORAL_TABLET | Freq: Every day | ORAL | Status: DC
  Administered 2015-03-16: 25 mg via ORAL
  Filled 2015-03-15: qty 1

## 2015-03-15 MED ORDER — MORPHINE SULFATE (PF) 2 MG/ML IV SOLN
2.0000 mg | INTRAVENOUS | Status: DC | PRN
  Administered 2015-03-15 – 2015-03-16 (×3): 2 mg via INTRAVENOUS
  Filled 2015-03-15 (×3): qty 1

## 2015-03-15 MED ORDER — PANTOPRAZOLE SODIUM 40 MG PO TBEC
40.0000 mg | DELAYED_RELEASE_TABLET | Freq: Every day | ORAL | Status: DC
  Administered 2015-03-16: 40 mg via ORAL
  Filled 2015-03-15: qty 1

## 2015-03-15 NOTE — Progress Notes (Signed)
RN reported pt having chest pain radiating to left shoulder. Morphine given, NTG patch already on and pt on ASA daily. Hx says pain is atypical. Troponins neg so far. R/p 12 lead EKG now shows no acute changes. Continue current plan. Clance Boll, NP [facility]

## 2015-03-15 NOTE — ED Notes (Signed)
Pt reports pain in neck and chest that started around 8:30 last night.  Pt says the pain comes in "waves."  Denies sob or n/v.  Pt also reports pain radiates to left shoulder.  No chest pain at this time.

## 2015-03-15 NOTE — ED Provider Notes (Signed)
CSN: [id number]     Arrival date & time 03/15/15  [id number] History   First MD Initiated Contact with Patient 03/15/15 [phone]     Chief Complaint  Patient presents with  . Chest Pain     (Consider location/radiation/quality/duration/timing/severity/associated sxs/prior Treatment) The history is provided by the patient.   Samantha Osborne is a 60 y.o. female with a history of htn, hypothyroidism, cervical ddd , acid reflux and early carotid artery occlusion followed yearly by Dr. Donnetta Hutching presenting with intermittent burning sensation which started in her anterior neck with radiation into her upper chest and at one point radiated into her left shoulder blade, which is currently resolved.  She denies weakness or numbness in her extremities, dizziness, confusion, nausea, vomiting, diaphoresis, abdominal pain. She does endorse intermittent palpitations which is a chronic transient problem,  Felt more pronounced briefly this am, now resolved. She also has a mild frontal headache. She has taken no medicine for this new complaint and has found no alleviators.      Past Medical History  Diagnosis Date  . Hypertension   . Thyroid disease   . DDD (degenerative disc disease)   . Depression   . Carotid artery occlusion   . DVT (deep venous thrombosis) ([place]) 2008    right leg   Past Surgical History  Procedure Laterality Date  . Cesarean section    . Dilation and curettage of uterus    . Shoulder arthroscopy Left 2005   Family History  Problem Relation Age of Onset  . Heart disease Mother   . Hyperlipidemia Mother   . Heart attack Mother   . AAA (abdominal aortic aneurysm) Mother   . Cancer Father   . Heart disease Brother   . Hypertension Brother   . Hypertension Son    Social History  Substance Use Topics  . Smoking status: Current Every Day Smoker -- 1.00 packs/day    Types: Cigarettes  . Smokeless tobacco: None  . Alcohol Use: No   OB History    No data available     Review of  Systems  Constitutional: Negative for fever.  HENT: Negative for congestion and sore throat.   Eyes: Negative.   Respiratory: Negative for chest tightness, shortness of breath, wheezing and stridor.   Cardiovascular: Positive for chest pain.  Gastrointestinal: Negative for nausea and abdominal pain.  Genitourinary: Negative.   Musculoskeletal: Positive for neck pain. Negative for joint swelling and arthralgias.  Skin: Negative.  Negative for rash and wound.  Neurological: Negative for dizziness, weakness, light-headedness, numbness and headaches.  Psychiatric/Behavioral: Negative.       Allergies  Review of patient's allergies indicates no known allergies.  Home Medications   Prior to Admission medications   Medication Sig Start Date End Date Taking? Authorizing Provider  aspirin EC 81 MG tablet Take 81 mg by mouth daily.   Yes Historical Provider, MD  Aspirin-Acetaminophen-Caffeine (GOODY HEADACHE PO) Take 1 packet by mouth as needed (for pain).   Yes Historical Provider, MD  buPROPion (WELLBUTRIN SR) 150 MG 12 hr tablet Take 1 tablet by mouth daily. 02/15/15  Yes Historical Provider, MD  cholecalciferol (VITAMIN D) 1000 units tablet Take 1,000 Units by mouth daily.   Yes Historical Provider, MD  diazepam (VALIUM) 10 MG tablet Take 10 mg by mouth at bedtime as needed for sleep.  07/20/13  Yes Historical Provider, MD  HYDROcodone-acetaminophen (NORCO) 10-325 MG per tablet Take 1 tablet by mouth every 4 (four) hours as needed. For  pain 09/07/13  Yes Historical Provider, MD  methocarbamol (ROBAXIN) 500 MG tablet Take 1 tablet (500 mg total) by mouth 4 (four) times daily. 09/11/13  Yes [name], PA-C  metoprolol tartrate (LOPRESSOR) 25 MG tablet Take 25 mg by mouth daily.    Yes Historical Provider, MD  Omega-3 Fatty Acids (FISH OIL) 1000 MG CAPS Take 1 capsule by mouth daily.    Yes Historical Provider, MD  SYNTHROID 112 MCG tablet Take 112 mcg by mouth daily. 08/11/13  Yes Historical  Provider, MD   BP 118/58 mmHg  Pulse 67  Temp(Src) 98.4 F (36.9 C) (Oral)  Resp 15  Ht 5\' 2"  (1.575 m)  Wt 62.596 kg  BMI 25.23 kg/m2  SpO2 93% Physical Exam  Constitutional: She appears well-developed and well-nourished.  HENT:  Head: Normocephalic and atraumatic.  Eyes: Conjunctivae are normal.  Neck: Normal range of motion.  Cardiovascular: Normal rate, regular rhythm, normal heart sounds and intact distal pulses.   No murmur heard. No carotid bruit appreciated.  Pulmonary/Chest: Effort normal and breath sounds normal. She has no wheezes.  Abdominal: Soft. Bowel sounds are normal. There is no tenderness.  Musculoskeletal: Normal range of motion.  Neurological: She is alert. No cranial nerve deficit or sensory deficit.  Reflex Scores:      Bicep reflexes are 2+ on the right side and 2+ on the left side. Equal grip strength.  Skin: Skin is warm and dry.  Psychiatric: She has a normal mood and affect.  Nursing note and vitals reviewed.   ED Course  Procedures (including critical care time) Labs Review Labs Reviewed  CBC WITH DIFFERENTIAL/PLATELET - Abnormal; Notable for the following:    WBC 10.6 (*)    All other components within normal limits  BASIC METABOLIC PANEL - Abnormal; Notable for the following:    Creatinine, Ser 1.10 (*)    GFR calc non Af Amer 53 (*)    All other components within normal limits  TROPONIN I    Imaging Review Dg Chest Portable 1 View  03/15/2015  CLINICAL DATA:  61 year old female with chest pain since last night. Pain radiating to the back. Initial encounter. Smoker. EXAM: PORTABLE CHEST 1 VIEW COMPARISON:  04/30/2005 FINDINGS: Portable AP upright view at 0945 hours. Lower lung volumes. Stable cardiac size and mediastinal contours. Visualized tracheal air column is within normal limits. Allowing for portable technique, the lungs are clear. No pneumothorax or pleural effusion. IMPRESSION: No acute cardiopulmonary abnormality. Electronically  Signed   By: Genevie Ann M.D.   On: 03/15/2015 10:04   I have personally reviewed and evaluated these images and lab results as part of my medical decision-making.   EKG Interpretation   Date/Time:  Tuesday March 15 2015 09:39:19 EST Ventricular Rate:  66 PR Interval:  143 QRS Duration: 103 QT Interval:  411 QTC Calculation: 431 R Axis:   59 Text Interpretation:  Sinus rhythm RSR' in V1 or V2, right VCD or RVH  Confirmed by ZACKOWSKI  MD, SCOTT [id number]) on 03/15/2015 9:45:36 AM      MDM   Final diagnoses:  Chest pain, unspecified chest pain type    Pt with atypical chest pain with risk factors for coronary disease given htn, carotid disease, smoking hx.  Discussed with Dr Marin Comment who agrees with admission. Temp admission orders placed.    Evalee Jefferson, PA-C 03/15/15 1147

## 2015-03-15 NOTE — ED Provider Notes (Signed)
Medical screening examination/treatment/procedure(s) were conducted as a shared visit with non-physician practitioner(s) and myself.  I personally evaluated the patient during the encounter.   EKG Interpretation   Date/Time:  Tuesday March 15 2015 09:39:19 EST Ventricular Rate:  66 PR Interval:  143 QRS Duration: 103 QT Interval:  411 QTC Calculation: 431 R Axis:   59 Text Interpretation:  Sinus rhythm RSR' in V1 or V2, right VCD or RVH  Confirmed by Zerline Melchior  MD, Alejo Beamer [phone]) on 03/15/2015 9:45:36 AM       Results for orders placed or performed during the hospital encounter of 03/15/15  CBC with Differential  Result Value Ref Range   WBC 10.6 (H) 4.0 - 10.5 K/uL   RBC 4.23 3.87 - 5.11 MIL/uL   Hemoglobin 13.4 12.0 - 15.0 g/dL   HCT 39.8 36.0 - 46.0 %   MCV 94.1 78.0 - 100.0 fL   MCH 31.7 26.0 - 34.0 pg   MCHC 33.7 30.0 - 36.0 g/dL   RDW 13.1 11.5 - 15.5 %   Platelets 276 150 - 400 K/uL   Neutrophils Relative % 53 %   Neutro Abs 5.5 1.7 - 7.7 K/uL   Lymphocytes Relative 37 %   Lymphs Abs 3.9 0.7 - 4.0 K/uL   Monocytes Relative 6 %   Monocytes Absolute 0.7 0.1 - 1.0 K/uL   Eosinophils Relative 4 %   Eosinophils Absolute 0.4 0.0 - 0.7 K/uL   Basophils Relative 0 %   Basophils Absolute 0.0 0.0 - 0.1 K/uL  Basic metabolic panel  Result Value Ref Range   Sodium 141 135 - 145 mmol/L   Potassium 4.1 3.5 - 5.1 mmol/L   Chloride 104 101 - 111 mmol/L   CO2 27 22 - 32 mmol/L   Glucose, Bld 96 65 - 99 mg/dL   BUN 18 6 - 20 mg/dL   Creatinine, Ser 1.10 (H) 0.44 - 1.00 mg/dL   Calcium 9.0 8.9 - 10.3 mg/dL   GFR calc non Af Amer 53 (L) >60 mL/min   GFR calc Af Amer >60 >60 mL/min   Anion gap 10 5 - 15  Troponin I  Result Value Ref Range   Troponin I <0.03 <0.031 ng/mL   Dg Chest Portable 1 View  03/15/2015  CLINICAL DATA:  60 year old female with chest pain since last night. Pain radiating to the back. Initial encounter. Smoker. EXAM: PORTABLE CHEST 1 VIEW COMPARISON:   04/30/2005 FINDINGS: Portable AP upright view at 0945 hours. Lower lung volumes. Stable cardiac size and mediastinal contours. Visualized tracheal air column is within normal limits. Allowing for portable technique, the lungs are clear. No pneumothorax or pleural effusion. IMPRESSION: No acute cardiopulmonary abnormality. Electronically Signed   By: Genevie Ann M.D.   On: 03/15/2015 10:04    Patient seen by me. Patient will require admission for chest pain rule out possible unstable angina. Patient with intermittent episodes of chest pain lasting 5-10 minutes then going away reoccurring on average about every hour. Patient took 1 baby aspirin at home will need additional aspirin here. Workup troponin negative chest x-ray without acute findings no significant lab abnormalities. Patient's cardiac risk factors include peripheral vascular disease and hypertension. Patient with no known coronary artery disease. Patient is also a smoker.  We'll have the mid-level contact hospitalist for admission.  Fredia Sorrow, MD 03/15/15 1047

## 2015-03-15 NOTE — H&P (Signed)
[facility] History and Physical  Samantha Osborne [id number] DOB: 1955-09-05    PCP:   Glo Herring., MD   Chief Complaint: intermittent chest pain.   HPI: Samantha Osborne is an 60 y.o. female with hx of HLD, intolerant to statin, HTN, hypothryoidism, non obstructive carotid disease on ASA follows by Dr Donnetta Hutching, hx of anxiety, presented to the ER with one day hx of intermittent substernal burning CP.  She has no SOB, N/V, or diaphoresis.  She has GERD, and hadn't been on meds.  Evaluation in the ER showed EKG with NSR and no acute ST T changes, troponin was negative, and serology was unremarkable. Her CXR was clear.  She was given NTG and ASA and hospitalist was asked to admit her for atypical CP r/out given her multiple cardiac risks.   Rewiew of Systems:  Constitutional: Negative for malaise, fever and chills. No significant weight loss or weight gain Eyes: Negative for eye pain, redness and discharge, diplopia, visual changes, or flashes of light. ENMT: Negative for ear pain, hoarseness, nasal congestion, sinus pressure and sore throat. No headaches; tinnitus, drooling, or problem swallowing. Cardiovascular: Negative for  palpitations, diaphoresis, dyspnea and peripheral edema. ; No orthopnea, PND Respiratory: Negative for cough, hemoptysis, wheezing and stridor. No pleuritic chestpain. Gastrointestinal: Negative for nausea, vomiting, diarrhea, constipation, abdominal pain, melena, blood in stool, hematemesis, jaundice and rectal bleeding.    Genitourinary: Negative for frequency, dysuria, incontinence,flank pain and hematuria; Musculoskeletal: Negative for back pain and neck pain. Negative for swelling and trauma.;  Skin: . Negative for pruritus, rash, abrasions, bruising and skin lesion.; ulcerations Neuro: Negative for headache, lightheadedness and neck stiffness. Negative for weakness, altered level of consciousness , altered mental status, extremity weakness, burning feet,  involuntary movement, seizure and syncope.  Psych: negative for anxiety, depression, insomnia, tearfulness, panic attacks, hallucinations, paranoia, suicidal or homicidal ideation   Past Medical History  Diagnosis Date  . Hypertension   . Thyroid disease   . DDD (degenerative disc disease)   . Depression   . Carotid artery occlusion   . DVT (deep venous thrombosis) ([place]) 2008    right leg    Past Surgical History  Procedure Laterality Date  . Cesarean section    . Dilation and curettage of uterus    . Shoulder arthroscopy Left 2005    Medications:  HOME MEDS: Prior to Admission medications   Medication Sig Start Date End Date Taking? Authorizing Provider  aspirin EC 81 MG tablet Take 81 mg by mouth daily.   Yes Historical Provider, MD  Aspirin-Acetaminophen-Caffeine (GOODY HEADACHE PO) Take 1 packet by mouth as needed (for pain).   Yes Historical Provider, MD  buPROPion (WELLBUTRIN SR) 150 MG 12 hr tablet Take 1 tablet by mouth daily. 02/15/15  Yes Historical Provider, MD  cholecalciferol (VITAMIN D) 1000 units tablet Take 1,000 Units by mouth daily.   Yes Historical Provider, MD  diazepam (VALIUM) 10 MG tablet Take 10 mg by mouth at bedtime as needed for sleep.  07/20/13  Yes Historical Provider, MD  HYDROcodone-acetaminophen (NORCO) 10-325 MG per tablet Take 1 tablet by mouth every 4 (four) hours as needed. For pain 09/07/13  Yes Historical Provider, MD  methocarbamol (ROBAXIN) 500 MG tablet Take 1 tablet (500 mg total) by mouth 4 (four) times daily. 09/11/13  Yes [name], PA-C  metoprolol tartrate (LOPRESSOR) 25 MG tablet Take 25 mg by mouth daily.    Yes Historical Provider, MD  Omega-3 Fatty Acids (FISH OIL) 1000  MG CAPS Take 1 capsule by mouth daily.    Yes Historical Provider, MD  SYNTHROID 112 MCG tablet Take 112 mcg by mouth daily. 08/11/13  Yes Historical Provider, MD     Allergies:  No Known Allergies  Social History:   reports that she has been smoking  Cigarettes.  She has been smoking about 1.00 pack per day. She does not have any smokeless tobacco history on file. She reports that she does not drink alcohol or use illicit drugs.  Family History: Family History  Problem Relation Age of Onset  . Heart disease Mother   . Hyperlipidemia Mother   . Heart attack Mother   . AAA (abdominal aortic aneurysm) Mother   . Cancer Father   . Heart disease Brother   . Hypertension Brother   . Hypertension Son      Physical Exam: Filed Vitals:   03/15/15 1130 03/15/15 1200 03/15/15 1230 03/15/15 1312  BP: 118/58 116/82 97/73 112/81  Pulse: 67 62 67 70  Temp:    98.4 F (36.9 C)  TempSrc:    Oral  Resp: 15 17 21    Height:    5\' 2"  (1.575 m)  Weight:    62.596 kg (138 lb)  SpO2: 93% 93% 91% 98%   Blood pressure 112/81, pulse 70, temperature 98.4 F (36.9 C), temperature source Oral, resp. rate 21, height 5\' 2"  (1.575 m), weight 62.596 kg (138 lb), SpO2 98 %.  GEN:  Pleasant patient lying in the stretcher in no acute distress; cooperative with exam. PSYCH:  alert and oriented x4; does not appear anxious or depressed; affect is appropriate. HEENT: Mucous membranes pink and anicteric; PERRLA; EOM intact; no cervical lymphadenopathy nor thyromegaly or carotid bruit; no JVD; There were no stridor. Neck is very supple. Breasts:: Not examined CHEST WALL: No tenderness CHEST: Normal respiration, clear to auscultation bilaterally.  HEART: Regular rate and rhythm.  There are no murmur, rub, or gallops.   BACK: No kyphosis or scoliosis; no CVA tenderness ABDOMEN: soft and non-tender; no masses, no organomegaly, normal abdominal bowel sounds; no pannus; no intertriginous candida. There is no rebound and no distention. Rectal Exam: Not done EXTREMITIES: No bone or joint deformity; age-appropriate arthropathy of the hands and knees; no edema; no ulcerations.  There is no calf tenderness. Genitalia: not examined PULSES: 2+ and symmetric SKIN: Normal  hydration no rash or ulceration CNS: Cranial nerves 2-12 grossly intact no focal lateralizing neurologic deficit.  Speech is fluent; uvula elevated with phonation, facial symmetry and tongue midline. DTR are normal bilaterally, cerebella exam is intact, barbinski is negative and strengths are equaled bilaterally.  No sensory loss.   Labs on Admission:  Basic Metabolic Panel:  Recent Labs Lab 03/15/15 0953  NA 141  K 4.1  CL 104  CO2 27  GLUCOSE 96  BUN 18  CREATININE 1.10*  CALCIUM 9.0   CBC:  Recent Labs Lab 03/15/15 0953  WBC 10.6*  NEUTROABS 5.5  HGB 13.4  HCT 39.8  MCV 94.1  PLT 276   Cardiac Enzymes:  Recent Labs Lab 03/15/15 0953  TROPONINI <0.03    Radiological Exams on Admission: Dg Chest Portable 1 View  03/15/2015  CLINICAL DATA:  60 year old female with chest pain since last night. Pain radiating to the back. Initial encounter. Smoker. EXAM: PORTABLE CHEST 1 VIEW COMPARISON:  04/30/2005 FINDINGS: Portable AP upright view at 0945 hours. Lower lung volumes. Stable cardiac size and mediastinal contours. Visualized tracheal air column is within normal  limits. Allowing for portable technique, the lungs are clear. No pneumothorax or pleural effusion. IMPRESSION: No acute cardiopulmonary abnormality. Electronically Signed   By: Genevie Ann M.D.   On: 03/15/2015 10:04    EKG: Independently reviewed.    Assessment/Plan Present on Admission:  . Chest pain . Atypical chest pain . Carotid stenosis . Thyroid disease . GERD (gastroesophageal reflux disease)  PLAN:  Atypical CP:  Her symptoms did not sound like ACS, nor angina, but she has significant cardiac risk factors.  Will cycle her troponins and obtain ECHO.  She will be given ASA, along with NTP, and start on Pravachol (lower incidence of myalgia, especially if taken with CoQ10.  Will continue with her betablocker, and fish oil.   She would benefit having a nuclear stress test in follow up.  Will Tx her GERD  with PPI.  For her hypothyrodism, will continue with her supplement and obtain a TSH.   She is stable, full code, and will be admitted to [facility] service.  Thank you and Good Day.   Other plans as per orders. Code Status:FULL CODE.    Orvan Falconer, MD. FACP [facility] Pager [phone] 7pm to 7am.  03/15/2015, 2:26 PM

## 2015-03-16 ENCOUNTER — Observation Stay (HOSPITAL_BASED_OUTPATIENT_CLINIC_OR_DEPARTMENT_OTHER): Payer: PPO

## 2015-03-16 DIAGNOSIS — I6523 Occlusion and stenosis of bilateral carotid arteries: Secondary | ICD-10-CM | POA: Diagnosis not present

## 2015-03-16 DIAGNOSIS — R0789 Other chest pain: Secondary | ICD-10-CM | POA: Diagnosis not present

## 2015-03-16 DIAGNOSIS — K219 Gastro-esophageal reflux disease without esophagitis: Secondary | ICD-10-CM | POA: Diagnosis not present

## 2015-03-16 DIAGNOSIS — R079 Chest pain, unspecified: Secondary | ICD-10-CM | POA: Diagnosis not present

## 2015-03-16 DIAGNOSIS — E785 Hyperlipidemia, unspecified: Secondary | ICD-10-CM | POA: Diagnosis not present

## 2015-03-16 LAB — TROPONIN I

## 2015-03-16 MED ORDER — PANTOPRAZOLE SODIUM 40 MG PO TBEC: 40.0000 mg | DELAYED_RELEASE_TABLET | Freq: Every day | ORAL | Status: DC

## 2015-03-16 NOTE — Progress Notes (Signed)
Patient c/o headache and chest pain that radiates to left shoulder. Rates pain 5/10. BP is 114/101 and HR is 70. Nitro ointment in place. Patient given prn morphine with no pain relief. Paged midlevel. Order received for stat EKG.

## 2015-03-16 NOTE — Care Management Note (Signed)
Case Management Note  Patient Details  Name: Samantha Osborne MRN: [id number] Date of Birth: [birth year]  Subjective/Objective:      Spoke with patient who is alert and oriented from home with husband, Independent. No CM need identified.             Action/Plan:   Expected Discharge Date:  03/15/15               Expected Discharge Plan:  Home/Self Care  In-House Referral:     Discharge planning Services  CM Consult  Post Acute Care Choice:    Choice offered to:     DME Arranged:    DME Agency:     HH Arranged:    HH Agency:     Status of Service:  Completed, signed off  Medicare Important Message Given:    Date Medicare IM Given:    Medicare IM give by:    Date Additional Medicare IM Given:    Additional Medicare Important Message give by:     If discussed at [place] of Stay Meetings, dates discussed:    Additional Comments:  Alvie Heidelberg, RN 03/16/2015, 11:57 AM

## 2015-03-16 NOTE — Discharge Summary (Signed)
Physician Discharge Summary  Samantha Osborne [id number] DOB: 10/22/1955 DOA: 03/15/2015  PCP: Samantha Osborne., MD  Admit date: 03/15/2015 Discharge date: 03/16/2015  Time spent: 35 minutes  Recommendations for Outpatient Follow-up:  1. Follow up with Cardiology for stress test as an outpatient 2. Follow up with PCP in 1-2 weeks   Discharge Diagnoses:  Principal Problem:   Atypical chest pain Active Problems:   Carotid stenosis   Chest pain   Hyperlipidemia   Thyroid disease   GERD (gastroesophageal reflux disease)   Discharge Condition: Improved  Diet recommendation: Heart healthy diet  Filed Weights   03/15/15 0937 03/15/15 1312  Weight: 62.596 kg (138 lb) 62.596 kg (138 lb)    History of present illness:  Patient is a 66yof with a hx of GERD, HLD, statin intolerance, HTN, hypothyroidism, non-obstructive carotid disease on ASA, and anxiety, presents to the ED with complaints of intermittent substernal burning CP. She denies any SOB, N/V, or diaphoresis. While in ED, EKG showed NSR, no acute ST changes, negative troponin, and unremarkable serology. Additionally, her CXR was clear. She was given NTG and ASA and admitted due to atypical CP and multiple cardiac risks.  Hospital Course:  Patient presented complaining of intermittent chest pain. Her EKG and CXR were both nonacute. She was given ASA and NTG with significant improvement. Serial troponins were negative. An ECHO was performed and did not reveal any acute disease. She has had 1-2 episodes of chest pain in the hospital, but reports these improve with narcotic pain medications. She has refused further nitroglycerin due to headache. Case discussed with cardiology who felt that stress testing would be appropriate for further evaluation. I offered to keep the patient in the hospital another night to be evaluated by cardiology and possible stress test in AM, but patient is adamant about being discharged home today. Since she  does not have any dynamic changes on EKG, cardiac enzymes have been negative and ECHO appears unremarkable, will schedule patient to have stress test in 1 week as an outpatient. Patient takes frequent [place] powders which could cause GERD and lead to chest pain. She has been advised to avoid from [place] powders and will be prescribed protonix.   Procedures:  ECHO  - Left ventricle: The cavity size was normal. Wall thickness was increased in a pattern of mild LVH. Systolic function was normal. The estimated ejection fraction was in the range of 60% to 65%. Wall motion was normal; there were no regional wall motion abnormalities. Doppler parameters are consistent with abnormal left ventricular relaxation (grade 1 diastolic dysfunction). - Aortic valve: Mildly to moderately calcified annulus. Mildly thickened leaflets. - Mitral valve: Calcified annulus. Normal thickness leaflets . - Tricuspid valve: There was mild regurgitation.  Consultations:    Discharge Exam: Filed Vitals:   03/16/15 0542 03/16/15 1422  BP: 117/89 110/70  Pulse: 72 72  Temp: 98.3 F (36.8 C) 98.8 F (37.1 C)  Resp: 18 18    1. General: NAD, looks comfortable 2. Cardiovascular: RRR, S1, S2  3. Respiratory: clear bilaterally, No wheezing, rales or rhonchi 4. Abdomen: soft, non tender, no distention , bowel sounds normal 5. Musculoskeletal: No edema b/l   Discharge Instructions Discharge home.  Discharge Instructions    Diet - low sodium heart healthy    Complete by:  As directed      Diet - low sodium heart healthy    Complete by:  As directed      Increase activity slowly  Complete by:  As directed      Increase activity slowly    Complete by:  As directed           Current Discharge Medication List    START taking these medications   Details  pantoprazole (PROTONIX) 40 MG tablet Take 1 tablet (40 mg total) by mouth daily at 6 (six) AM. Qty: 30 tablet, Refills: 1       CONTINUE these medications which have NOT CHANGED   Details  aspirin EC 81 MG tablet Take 81 mg by mouth daily.         buPROPion (WELLBUTRIN SR) 150 MG 12 hr tablet Take 1 tablet by mouth daily.    cholecalciferol (VITAMIN D) 1000 units tablet Take 1,000 Units by mouth daily.    diazepam (VALIUM) 10 MG tablet Take 10 mg by mouth at bedtime as needed for sleep.     HYDROcodone-acetaminophen (NORCO) 10-325 MG per tablet Take 1 tablet by mouth every 4 (four) hours as needed. For pain    methocarbamol (ROBAXIN) 500 MG tablet Take 1 tablet (500 mg total) by mouth 4 (four) times daily. Qty: 28 tablet, Refills: 0    metoprolol tartrate (LOPRESSOR) 25 MG tablet Take 25 mg by mouth daily.     Omega-3 Fatty Acids (FISH OIL) 1000 MG CAPS Take 1 capsule by mouth daily.     SYNTHROID 112 MCG tablet Take 112 mcg by mouth daily.       No Known Allergies Follow-up Information    Follow up with [facility] On 03/28/2015.   Specialty:  Cardiac Rehabilitation   Why:  Appointment for Stress Test Monday, March 28, 2015 register at 8:00am in Radiology.   Contact information:   [address] [id number] [place] [phone]       The results of significant diagnostics from this hospitalization (including imaging, microbiology, ancillary and laboratory) are listed below for reference.    Significant Diagnostic Studies: Dg Chest Portable 1 View  03/15/2015  CLINICAL DATA:  60 year old female with chest pain since last night. Pain radiating to the back. Initial encounter. Smoker. EXAM: PORTABLE CHEST 1 VIEW COMPARISON:  04/30/2005 FINDINGS: Portable AP upright view at 0945 hours. Lower lung volumes. Stable cardiac size and mediastinal contours. Visualized tracheal air column is within normal limits. Allowing for portable technique, the lungs are clear. No pneumothorax or pleural effusion. IMPRESSION: No acute cardiopulmonary abnormality.  Electronically Signed   By: Genevie Ann M.D.   On: 03/15/2015 10:04    Labs: Basic Metabolic Panel:  Recent Labs Lab 03/15/15 0953  NA 141  K 4.1  CL 104  CO2 27  GLUCOSE 96  BUN 18  CREATININE 1.10*  CALCIUM 9.0   CBC:  Recent Labs Lab 03/15/15 0953  WBC 10.6*  NEUTROABS 5.5  HGB 13.4  HCT 39.8  MCV 94.1  PLT 276   Cardiac Enzymes:  Recent Labs Lab 03/15/15 0953 03/15/15 1434 03/15/15 1944 03/16/15 0212  TROPONINI <0.03 <0.03 <0.03 <0.03    Signed:  Kathie Dike, MD.  [facility] 03/16/2015, 3:51 PM  By signing my name below, I, Delene Ruffini, attest that this documentation has been prepared under the direction and in the presence of Kathie Dike, MD. Electronically Signed: Delene Ruffini  03/16/2015   I, Dr. Kathie Dike, personally performed the services described in this documentaiton. All medical record entries made by the scribe were at my direction and in my presence. I have reviewed  the chart and agree that the record reflects my personal performance and is accurate and complete  Kathie Dike, MD, 03/16/2015 3:51 PM

## 2015-03-16 NOTE — Progress Notes (Signed)
EKG complete. Shows Normal Sinus Rhythm. Patient states that chest pain is getting better. Refuses to wear nitro ointment at this time because she says that it is causing her headache. Nitro ointment removed. Paged midlevel to notify of this. No new orders at this time. Will continue to monitor closely.

## 2015-03-16 NOTE — Care Management Obs Status (Signed)
[facility] NOTIFICATION   Patient Details  Name: Samantha Osborne MRN: [id number] Date of Birth: 1955-12-06   Medicare Observation Status Notification Given:  Yes    Alvie Heidelberg, RN 03/16/2015, 11:31 AM

## 2015-03-19 LAB — TSH+FREE T4
FREE T4 BY DIALYSIS: 1.4 ng/dL
TSH: 0.63 uU/mL

## 2015-03-19 LAB — CORTISOL-AM, BLOOD: CORTISOL - AM: 5.4 ug/dL — AB (ref 6.2–19.4)

## 2015-03-22 DIAGNOSIS — Z1389 Encounter for screening for other disorder: Secondary | ICD-10-CM | POA: Diagnosis not present

## 2015-03-22 DIAGNOSIS — K224 Dyskinesia of esophagus: Secondary | ICD-10-CM | POA: Diagnosis not present

## 2015-03-22 DIAGNOSIS — E063 Autoimmune thyroiditis: Secondary | ICD-10-CM | POA: Diagnosis not present

## 2015-03-22 DIAGNOSIS — G894 Chronic pain syndrome: Secondary | ICD-10-CM | POA: Diagnosis not present

## 2015-03-22 DIAGNOSIS — R079 Chest pain, unspecified: Secondary | ICD-10-CM | POA: Diagnosis not present

## 2015-03-22 DIAGNOSIS — F1120 Opioid dependence, uncomplicated: Secondary | ICD-10-CM | POA: Diagnosis not present

## 2015-03-22 DIAGNOSIS — Z6824 Body mass index (BMI) 24.0-24.9, adult: Secondary | ICD-10-CM | POA: Diagnosis not present

## 2015-03-22 DIAGNOSIS — E271 Primary adrenocortical insufficiency: Secondary | ICD-10-CM | POA: Diagnosis not present

## 2015-03-24 ENCOUNTER — Ambulatory Visit (INDEPENDENT_AMBULATORY_CARE_PROVIDER_SITE_OTHER): Payer: PPO | Admitting: "Endocrinology

## 2015-03-24 ENCOUNTER — Encounter: Payer: Self-pay | Admitting: "Endocrinology

## 2015-03-24 VITALS — BP 135/80 | HR 87 | Ht 62.0 in | Wt 134.0 lb

## 2015-03-24 DIAGNOSIS — E2740 Unspecified adrenocortical insufficiency: Secondary | ICD-10-CM

## 2015-03-24 DIAGNOSIS — E038 Other specified hypothyroidism: Secondary | ICD-10-CM

## 2015-03-24 NOTE — Progress Notes (Signed)
Subjective:    Patient ID: Samantha Osborne, female    DOB: 03-31-1955,    Past Medical History  Diagnosis Date  . Hypertension   . Thyroid disease   . DDD (degenerative disc disease)   . Depression   . Carotid artery occlusion   . DVT (deep venous thrombosis) ([place]) 2008    right leg   Past Surgical History  Procedure Laterality Date  . Cesarean section    . Dilation and curettage of uterus    . Shoulder arthroscopy Left 2005   Social History   Social History  . Marital Status: Married    Spouse Name: N/A  . Number of Children: N/A  . Years of Education: N/A   Social History Main Topics  . Smoking status: Current Every Day Smoker -- 1.00 packs/day    Types: Cigarettes  . Smokeless tobacco: None  . Alcohol Use: No  . Drug Use: No  . Sexual Activity: Yes   Other Topics Concern  . None   Social History Narrative   Outpatient Encounter Prescriptions as of 03/24/2015  Medication Sig  . aspirin EC 81 MG tablet Take 81 mg by mouth daily.  . Aspirin-Acetaminophen-Caffeine (GOODY HEADACHE PO) Take 1 packet by mouth as needed (for pain).  Marland Kitchen buPROPion (WELLBUTRIN SR) 150 MG 12 hr tablet Take 1 tablet by mouth daily.  . cholecalciferol (VITAMIN D) 1000 units tablet Take 1,000 Units by mouth daily.  . diazepam (VALIUM) 10 MG tablet Take 10 mg by mouth at bedtime as needed for sleep.   Marland Kitchen HYDROcodone-acetaminophen (NORCO) 10-325 MG per tablet Take 1 tablet by mouth every 4 (four) hours as needed. For pain  . methocarbamol (ROBAXIN) 500 MG tablet Take 1 tablet (500 mg total) by mouth 4 (four) times daily.  . metoprolol tartrate (LOPRESSOR) 25 MG tablet Take 25 mg by mouth daily.   . Omega-3 Fatty Acids (FISH OIL) 1000 MG CAPS Take 1 capsule by mouth daily.   . pantoprazole (PROTONIX) 40 MG tablet Take 1 tablet (40 mg total) by mouth daily at 6 (six) AM.  . SYNTHROID 112 MCG tablet Take 112 mcg by mouth daily.   No facility-administered encounter medications on file as of  03/24/2015.   ALLERGIES: No Known Allergies VACCINATION STATUS:  There is no immunization history on file for this patient.  HPI Samantha Osborne is a 60 year old female patient with medical history as above. She is here to  follow-up for her history of hypothyroidism and recent hypercortisolism.  -She does not have a new complaints today.  She gives a history of chronic exposure to steroids for various reasons including arthritis, at one point as high as Decadron 6 mg by mouth daily. She also has history of hyperthyroidism treated with RAI approximately 20 years ago currently on Synthroid 112 g by mouth every morning. She is a chronic smoker smoked for 42 years with approximate pack year of 64. She denies history of adrenal insufficiency.  -Her last 2 labs showed suboptimal a.m. cortisol of 4.5 and 5.5, however ACTH stimulation test was unremarkable. She is not on steroids supplement at this point. She denies nausea vomiting, and abdominal pain. Her last exposure for steroids she says was 6 months ago. She continues to smoke.   Review of Systems   Constitutional: +weight gain, + fatigue, no subjective hyperthermia/hypothermia Eyes: no blurry vision, no xerophthalmia ENT: no sore throat, no nodules palpated in throat, no dysphagia/odynophagia, no hoarseness Cardiovascular: no CP/SOB/palpitations/leg swelling Respiratory:  no cough/SOB Gastrointestinal: no N/V/D/C Musculoskeletal: no muscle/joint aches Skin: no rashes Neurological: no tremors/numbness/tingling/dizziness Psychiatric: no depression/anxiety  Objective:    BP 135/80 mmHg  Pulse 87  Ht 5' 2"  (1.575 m)  Wt 134 lb (60.782 kg)  BMI 24.50 kg/m2  SpO2 99%  Wt Readings from Last 3 Encounters:  03/24/15 134 lb (60.782 kg)  03/15/15 138 lb (62.596 kg)  12/20/14 135 lb (61.236 kg)    Physical Exam   Constitutional: overweight, in NAD Eyes: PERRLA, EOMI, no exophthalmos ENT: moist mucous membranes, no thyromegaly, no  cervical lymphadenopathy Cardiovascular: RRR, No MRG Respiratory: CTA B Gastrointestinal: abdomen soft, NT, ND, BS+ Musculoskeletal: no deformities, strength intact in all 4 Skin: moist, warm, no rashes Neurological: no tremor with outstretched hands, DTR normal in all 4  On 10/04/2014 her ACTH was low at 6. 3 AM cortisol 4. 5 PM cortisol 6.3 TSH 0.6 vitamin D 22.5 lipid panel showed a high LDL of 162 her CMP was within normal range  Recent Results (from the past 2160 hour(s))  TSH+Free T4     Status: None   Collection Time: 03/14/15  8:56 AM  Result Value Ref Range   TSH-ICMA 0.63 uU/mL    Comment: Reference Range: Pubertal Children and Adults: 0.5 - 4.8    Free T4 by Dialysis 1.4 ng/dL    Comment: Reference Range: Pubertal Children and Adults: 0.8 - 1.7   Cortisol-am, blood     Status: Abnormal   Collection Time: 03/14/15  8:56 AM  Result Value Ref Range   Cortisol - AM 5.4 (L) 6.2 - 19.4 ug/dL  CBC with Differential     Status: Abnormal   Collection Time: 03/15/15  9:53 AM  Result Value Ref Range   WBC 10.6 (H) 4.0 - 10.5 K/uL   RBC 4.23 3.87 - 5.11 MIL/uL   Hemoglobin 13.4 12.0 - 15.0 g/dL   HCT 39.8 36.0 - 46.0 %   MCV 94.1 78.0 - 100.0 fL   MCH 31.7 26.0 - 34.0 pg   MCHC 33.7 30.0 - 36.0 g/dL   RDW 13.1 11.5 - 15.5 %   Platelets 276 150 - 400 K/uL   Neutrophils Relative % 53 %   Neutro Abs 5.5 1.7 - 7.7 K/uL   Lymphocytes Relative 37 %   Lymphs Abs 3.9 0.7 - 4.0 K/uL   Monocytes Relative 6 %   Monocytes Absolute 0.7 0.1 - 1.0 K/uL   Eosinophils Relative 4 %   Eosinophils Absolute 0.4 0.0 - 0.7 K/uL   Basophils Relative 0 %   Basophils Absolute 0.0 0.0 - 0.1 K/uL  Basic metabolic panel     Status: Abnormal   Collection Time: 03/15/15  9:53 AM  Result Value Ref Range   Sodium 141 135 - 145 mmol/L   Potassium 4.1 3.5 - 5.1 mmol/L   Chloride 104 101 - 111 mmol/L   CO2 27 22 - 32 mmol/L   Glucose, Bld 96 65 - 99 mg/dL   BUN 18 6 - 20 mg/dL   Creatinine, Ser  1.10 (H) 0.44 - 1.00 mg/dL   Calcium 9.0 8.9 - 10.3 mg/dL   GFR calc non Af Amer 53 (L) >60 mL/min   GFR calc Af Amer >60 >60 mL/min    Comment: (NOTE) The eGFR has been calculated using the CKD EPI equation. This calculation has not been validated in all clinical situations. eGFR's persistently <60 mL/min signify possible Chronic Kidney Disease.    Anion gap 10 5 -  15  Troponin I     Status: None   Collection Time: 03/15/15  9:53 AM  Result Value Ref Range   Troponin I <0.03 <0.031 ng/mL    Comment:        NO INDICATION OF MYOCARDIAL INJURY.   TSH     Status: None   Collection Time: 03/15/15  2:34 PM  Result Value Ref Range   TSH 0.520 0.350 - 4.500 uIU/mL  Troponin I     Status: None   Collection Time: 03/15/15  2:34 PM  Result Value Ref Range   Troponin I <0.03 <0.031 ng/mL    Comment:        NO INDICATION OF MYOCARDIAL INJURY.   Troponin I     Status: None   Collection Time: 03/15/15  7:44 PM  Result Value Ref Range   Troponin I <0.03 <0.031 ng/mL    Comment:        NO INDICATION OF MYOCARDIAL INJURY.   Troponin I     Status: None   Collection Time: 03/16/15  2:12 AM  Result Value Ref Range   Troponin I <0.03 <0.031 ng/mL    Comment:        NO INDICATION OF MYOCARDIAL INJURY.     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 -Her baseline a.m. cortisol is still low at 5.4. Prior to her last visit, her baseline cortisol was 5.5  and 30 minutes and 60 minutes cortisol are adequate at above 20. She will not need steroids replacement for now, but needs evaluation at 6 months.  Her intermittent exposure to high-dose steroids, her chronic use of opioids for pain control puts her at moe risk of acquired adrenal insufficiency.  -I have counseled patient to consider smoking cessation to help her decrease the risk of COPD which may require more  steroid exposure.  2. 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 She is euthyroid on 112 g of Synthroid. I advised her to continue on the same.   - We discussed about correct intake of levothyroxine, at fasting, with water, separated by at least 30 minutes from breakfast, and separated by more than 4 hours from calcium, iron, multivitamins, acid reflux medications (PPIs). -Patient is made aware of the fact that thyroid hormone replacement is needed for life, dose to be adjusted by periodic monitoring of thyroid function tests.  I advised patient to maintain close follow up with their PCP for primary care needs. Follow up plan: Return in about 6 months (around 09/24/2015) for underactive thyroid, follow up with pre-visit labs. in 3 months.  Glade Lloyd, MD Phone: [phone]  Fax: [phone]   03/24/2015, 11:34 AM

## 2015-03-25 ENCOUNTER — Other Ambulatory Visit: Payer: Self-pay

## 2015-03-25 DIAGNOSIS — R079 Chest pain, unspecified: Secondary | ICD-10-CM

## 2015-03-28 ENCOUNTER — Encounter (HOSPITAL_COMMUNITY)
Admission: RE | Admit: 2015-03-28 | Discharge: 2015-03-28 | Disposition: A | Discharge status: Home / Self Care | Payer: PPO | Source: Ambulatory Visit | Attending: Cardiology | Admitting: Cardiology

## 2015-03-28 ENCOUNTER — Encounter (HOSPITAL_COMMUNITY): Payer: Self-pay

## 2015-03-28 ENCOUNTER — Inpatient Hospital Stay (HOSPITAL_COMMUNITY): Admit: 2015-03-28 | Payer: PPO

## 2015-03-28 ENCOUNTER — Encounter (HOSPITAL_BASED_OUTPATIENT_CLINIC_OR_DEPARTMENT_OTHER)
Admission: RE | Admit: 2015-03-28 | Discharge: 2015-03-28 | Disposition: A | Discharge status: Home / Self Care | Payer: PPO | Source: Ambulatory Visit | Attending: Cardiology | Admitting: Cardiology

## 2015-03-28 DIAGNOSIS — R931 Abnormal findings on diagnostic imaging of heart and coronary circulation: Secondary | ICD-10-CM | POA: Insufficient documentation

## 2015-03-28 DIAGNOSIS — R079 Chest pain, unspecified: Secondary | ICD-10-CM | POA: Insufficient documentation

## 2015-03-28 LAB — NM MYOCAR MULTI W/SPECT W/WALL MOTION / EF
CHL CUP NUCLEAR SDS: 5
CHL CUP RESTING HR STRESS: 80 {beats}/min
CSEPPHR: 133 {beats}/min
LV dias vol: 50 mL
LVSYSVOL: 29 mL
RATE: 0.29
SRS: 0
SSS: 5
TID: 0.74

## 2015-03-28 MED ORDER — REGADENOSON 0.4 MG/5ML IV SOLN
INTRAVENOUS | Status: AC
  Administered 2015-03-28: 0.4 mg via INTRAVENOUS
  Filled 2015-03-28: qty 5

## 2015-03-28 MED ORDER — TECHNETIUM TC 99M SESTAMIBI GENERIC - CARDIOLITE
10.0000 | Freq: Once | INTRAVENOUS | Status: AC | PRN
  Administered 2015-03-28: 10 via INTRAVENOUS

## 2015-03-28 MED ORDER — TECHNETIUM TC 99M SESTAMIBI - CARDIOLITE
30.0000 | Freq: Once | INTRAVENOUS | Status: AC | PRN
  Administered 2015-03-28: 10:00:00 30 via INTRAVENOUS

## 2015-03-28 MED ORDER — SODIUM CHLORIDE 0.9% FLUSH
INTRAVENOUS | Status: AC
  Administered 2015-03-28: 10 mL via INTRAVENOUS
  Filled 2015-03-28: qty 10

## 2015-03-29 ENCOUNTER — Ambulatory Visit (INDEPENDENT_AMBULATORY_CARE_PROVIDER_SITE_OTHER): Payer: PPO | Admitting: Cardiology

## 2015-03-29 ENCOUNTER — Encounter: Payer: Self-pay | Admitting: Cardiology

## 2015-03-29 VITALS — BP 130/82 | HR 104 | Ht 62.0 in | Wt 133.0 lb

## 2015-03-29 DIAGNOSIS — R0789 Other chest pain: Secondary | ICD-10-CM

## 2015-03-29 DIAGNOSIS — E785 Hyperlipidemia, unspecified: Secondary | ICD-10-CM

## 2015-03-29 MED ORDER — METOPROLOL TARTRATE 25 MG PO TABS: 25.0000 mg | ORAL_TABLET | Freq: Two times a day (BID) | ORAL | Status: DC

## 2015-03-29 NOTE — Patient Instructions (Addendum)
Medication Instructions:  INCREASE YOUR LOPRESSOR TO 25 MG TWO TIMES DAILY.   Labwork: I WILL REQUEST LABS FROM YOUR PCP  Testing/Procedures: NONE  Follow-Up: Your physician wants you to follow-up in: 1 YEAR .  You will receive a reminder letter in the mail two months in advance. If you don't receive a letter, please call our office to schedule the follow-up appointment.'  Any Other Special Instructions Will Be Listed Below (If Applicable).     If you need a refill on your cardiac medications before your next appointment, please call your pharmacy.

## 2015-03-29 NOTE — Progress Notes (Signed)
Patient ID: Samantha Osborne, female   DOB: May 09, 1955, 60 y.o.   MRN: [id number]     Clinical Summary [name] is a 60 y.o.female seen today as a new patient, she is referred by Dr Jay Schlichter for chest pain.  1. Chest pain - symptoms started when she was having significant migraine headache, spread down neck into chest.  - burning aching feeling in midchest, 5/10. No SOB, no palpitations. - denies significant chest pains since recent hospital discharge.  - denies any SOB or DOE - denies any LE edema.  - recent admit [facility] 02/2015. Negative workup for ACS, echo showed LVEF 123456, grade I diastolic dysfunction, no WMAs - she was referred for outpatient stress test. Nuclear stress 03/2015 showed no ischemia, LVEF 30-44%    2. Carotid stenosis - followed by vascular  3. Hyperlipidemia - statin intolerance. Currently on fish oil Past Medical History  Diagnosis Date  . Hypertension   . Thyroid disease   . DDD (degenerative disc disease)   . Depression   . Carotid artery occlusion   . DVT (deep venous thrombosis) ([place]) 2008    right leg     No Known Allergies   Current Outpatient Prescriptions  Medication Sig Dispense Refill  . aspirin EC 81 MG tablet Take 81 mg by mouth daily.    . Aspirin-Acetaminophen-Caffeine (GOODY HEADACHE PO) Take 1 packet by mouth as needed (for pain).    Marland Kitchen buPROPion (WELLBUTRIN SR) 150 MG 12 hr tablet Take 1 tablet by mouth daily.    . cholecalciferol (VITAMIN D) 1000 units tablet Take 1,000 Units by mouth daily.    . diazepam (VALIUM) 10 MG tablet Take 10 mg by mouth at bedtime as needed for sleep.     Marland Kitchen HYDROcodone-acetaminophen (NORCO) 10-325 MG per tablet Take 1 tablet by mouth every 4 (four) hours as needed. For pain    . methocarbamol (ROBAXIN) 500 MG tablet Take 1 tablet (500 mg total) by mouth 4 (four) times daily. 28 tablet 0  . metoprolol tartrate (LOPRESSOR) 25 MG tablet Take 25 mg by mouth daily.     . Omega-3 Fatty Acids (FISH OIL)  1000 MG CAPS Take 1 capsule by mouth daily.     . pantoprazole (PROTONIX) 40 MG tablet Take 1 tablet (40 mg total) by mouth daily at 6 (six) AM. 30 tablet 1  . SYNTHROID 112 MCG tablet Take 112 mcg by mouth daily.     No current facility-administered medications for this visit.     Past Surgical History  Procedure Laterality Date  . Cesarean section    . Dilation and curettage of uterus    . Shoulder arthroscopy Left 2005     No Known Allergies    Family History  Problem Relation Age of Onset  . Heart disease Mother   . Hyperlipidemia Mother   . Heart attack Mother   . AAA (abdominal aortic aneurysm) Mother   . Cancer Father   . Heart disease Brother   . Hypertension Brother   . Hypertension Son      Social History [name] reports that she has been smoking Cigarettes.  She has been smoking about 1.00 pack per day. She does not have any smokeless tobacco history on file. [name] reports that she does not drink alcohol.   Review of Systems CONSTITUTIONAL: No weight loss, fever, chills, weakness or fatigue.  HEENT: Eyes: No visual loss, blurred vision, double vision or yellow sclerae.No hearing loss, sneezing,  congestion, runny nose or sore throat.  SKIN: No rash or itching.  CARDIOVASCULAR: per HPI RESPIRATORY: No shortness of breath, cough or sputum.  GASTROINTESTINAL: No anorexia, nausea, vomiting or diarrhea. No abdominal pain or blood.  GENITOURINARY: No burning on urination, no polyuria NEUROLOGICAL: No headache, dizziness, syncope, paralysis, ataxia, numbness or tingling in the extremities. No change in bowel or bladder control.  MUSCULOSKELETAL: No muscle, back pain, joint pain or stiffness.  LYMPHATICS: No enlarged nodes. No history of splenectomy.  PSYCHIATRIC: No history of depression or anxiety.  ENDOCRINOLOGIC: No reports of sweating, cold or heat intolerance. No polyuria or polydipsia.  Marland Kitchen   Physical Examination Filed Vitals:   03/29/15 1314    BP: 130/82  Pulse: 104   Filed Vitals:   03/29/15 1314  Height: 5\' 2"  (1.575 m)  Weight: 133 lb (60.328 kg)    Gen: resting comfortably, no acute distress HEENT: no scleral icterus, pupils equal round and reactive, no palptable cervical adenopathy,  CV: RRR, no m/r/g, no jvd Resp: Clear to auscultation bilaterally GI: abdomen is soft, non-tender, non-distended, normal bowel sounds, no hepatosplenomegaly MSK: extremities are warm, no edema.  Skin: warm, no rash Neuro:  no focal deficits Psych: appropriate affect   Diagnostic Studies 02/2015 echo Study Conclusions  - Left ventricle: The cavity size was normal. Wall thickness was increased in a pattern of mild LVH. Systolic function was normal. The estimated ejection fraction was in the range of 60% to 65%. Wall motion was normal; there were no regional wall motion abnormalities. Doppler parameters are consistent with abnormal left ventricular relaxation (grade 1 diastolic dysfunction). - Aortic valve: Mildly to moderately calcified annulus. Mildly thickened leaflets. - Mitral valve: Calcified annulus. Normal thickness leaflets . - Tricuspid valve: There was mild regurgitation.  03/2015 Nuclear stress test  There was no ST segment deviation noted during stress.  The study is normal. There are no perfusion defects consistent with prior infarct or current ischemia.  This is an intermediate risk study. Intermediate risk based on low calculated LVEF. Visually the LVEF looks normal, consider correlation with echo. There is no myocardium at jeopardy.  The left ventricular ejection fraction is moderately decreased (30-44%).   Assessment and Plan  1. Chest pain - negative cardiac workup including stress test and echo. Of note reported low LVEF on nuclear however more definitive echo shows normal function - no further cardiac workup at this time, continue risk factor modification  2. Carotid stenosis - continue to  follow with vascular  3. Hyperlipidemia - intolerant to statins, currently diet controlled. Will request labs from pcp.     F/u 1 year  [name], M.D.

## 2015-04-04 ENCOUNTER — Encounter (HOSPITAL_COMMUNITY)
Admission: RE | Admit: 2015-04-04 | Discharge: 2015-04-04 | Disposition: A | Discharge status: Home / Self Care | Payer: PPO | Source: Ambulatory Visit | Attending: "Endocrinology | Admitting: "Endocrinology

## 2015-04-04 DIAGNOSIS — E2740 Unspecified adrenocortical insufficiency: Secondary | ICD-10-CM | POA: Insufficient documentation

## 2015-04-04 LAB — ACTH STIMULATION, 3 TIME POINTS
CORTISOL 30 MIN: 18.2 ug/dL
CORTISOL 60 MIN: 24 ug/dL
Cortisol, Base: 9 ug/dL

## 2015-04-04 MED ORDER — COSYNTROPIN 0.25 MG IJ SOLR
0.2500 mg | Freq: Once | INTRAMUSCULAR | Status: AC
  Administered 2015-04-04: 0.25 mg via INTRAVENOUS
  Filled 2015-04-04: qty 0.25

## 2015-04-04 MED ORDER — SODIUM CHLORIDE 0.9 % IV SOLN
Freq: Once | INTRAVENOUS | Status: AC
  Administered 2015-04-04: 10 mL/h via INTRAVENOUS

## 2015-04-04 NOTE — Progress Notes (Signed)
Results for MICKY, RODDA (MRN [id number]) as of 04/04/2015 13:42  Ref. Range 04/04/2015 07:54  Cortisol, Base Latest Units: ug/dL 9.0  Cortisol, 30 Min Latest Units: ug/dL 18.2  Cortisol, 60 Min Latest Units: ug/dL 24.0   Cosyntropin 0.25 mg IV administered after AM Cortisol level

## 2015-04-11 DIAGNOSIS — E063 Autoimmune thyroiditis: Secondary | ICD-10-CM | POA: Diagnosis not present

## 2015-04-11 DIAGNOSIS — F1120 Opioid dependence, uncomplicated: Secondary | ICD-10-CM | POA: Diagnosis not present

## 2015-04-11 DIAGNOSIS — Z6824 Body mass index (BMI) 24.0-24.9, adult: Secondary | ICD-10-CM | POA: Diagnosis not present

## 2015-04-11 DIAGNOSIS — E271 Primary adrenocortical insufficiency: Secondary | ICD-10-CM | POA: Diagnosis not present

## 2015-04-11 DIAGNOSIS — G894 Chronic pain syndrome: Secondary | ICD-10-CM | POA: Diagnosis not present

## 2015-04-11 DIAGNOSIS — M1991 Primary osteoarthritis, unspecified site: Secondary | ICD-10-CM | POA: Diagnosis not present

## 2015-04-11 DIAGNOSIS — F329 Major depressive disorder, single episode, unspecified: Secondary | ICD-10-CM | POA: Diagnosis not present

## 2015-04-11 DIAGNOSIS — F330 Major depressive disorder, recurrent, mild: Secondary | ICD-10-CM | POA: Diagnosis not present

## 2015-04-11 DIAGNOSIS — Z1389 Encounter for screening for other disorder: Secondary | ICD-10-CM | POA: Diagnosis not present

## 2015-04-15 ENCOUNTER — Telehealth: Payer: Self-pay | Admitting: "Endocrinology

## 2015-04-15 NOTE — Telephone Encounter (Signed)
Pt had her ACTH done too early. Does she just get the TFT's before her next visit

## 2015-04-15 NOTE — Telephone Encounter (Signed)
she  called to say she already had labs done but her apt isn't until sept. She said the hospital called her and told her she needed to get her tests done - she was very confused

## 2015-04-15 NOTE — Telephone Encounter (Signed)
Left message for pt to call back  °

## 2015-04-15 NOTE — Telephone Encounter (Signed)
Yes, I noticed her ACTH was done too soon. she gets only TSH and free T4 before her next visit.  she does not have to come early.

## 2015-04-19 DIAGNOSIS — N952 Postmenopausal atrophic vaginitis: Secondary | ICD-10-CM | POA: Diagnosis not present

## 2015-04-20 NOTE — Telephone Encounter (Signed)
Pt.notified

## 2015-05-05 DIAGNOSIS — Z719 Counseling, unspecified: Secondary | ICD-10-CM | POA: Diagnosis not present

## 2015-05-07 DIAGNOSIS — G4730 Sleep apnea, unspecified: Secondary | ICD-10-CM | POA: Diagnosis not present

## 2015-06-15 DIAGNOSIS — G894 Chronic pain syndrome: Secondary | ICD-10-CM | POA: Diagnosis not present

## 2015-06-15 DIAGNOSIS — Z1389 Encounter for screening for other disorder: Secondary | ICD-10-CM | POA: Diagnosis not present

## 2015-06-15 DIAGNOSIS — K219 Gastro-esophageal reflux disease without esophagitis: Secondary | ICD-10-CM | POA: Diagnosis not present

## 2015-06-15 DIAGNOSIS — E271 Primary adrenocortical insufficiency: Secondary | ICD-10-CM | POA: Diagnosis not present

## 2015-06-15 DIAGNOSIS — Z6823 Body mass index (BMI) 23.0-23.9, adult: Secondary | ICD-10-CM | POA: Diagnosis not present

## 2015-06-15 DIAGNOSIS — R5383 Other fatigue: Secondary | ICD-10-CM | POA: Diagnosis not present

## 2015-06-15 DIAGNOSIS — E063 Autoimmune thyroiditis: Secondary | ICD-10-CM | POA: Diagnosis not present

## 2015-06-15 DIAGNOSIS — G4733 Obstructive sleep apnea (adult) (pediatric): Secondary | ICD-10-CM | POA: Diagnosis not present

## 2015-06-28 ENCOUNTER — Encounter (HOSPITAL_COMMUNITY): Payer: Medicare Other

## 2015-06-28 ENCOUNTER — Ambulatory Visit: Payer: Medicare Other | Admitting: Vascular Surgery

## 2015-06-30 ENCOUNTER — Encounter: Payer: Self-pay | Admitting: Vascular Surgery

## 2015-07-05 ENCOUNTER — Other Ambulatory Visit: Payer: Self-pay | Admitting: Vascular Surgery

## 2015-07-05 ENCOUNTER — Ambulatory Visit (INDEPENDENT_AMBULATORY_CARE_PROVIDER_SITE_OTHER): Payer: PPO | Admitting: Vascular Surgery

## 2015-07-05 ENCOUNTER — Ambulatory Visit (HOSPITAL_COMMUNITY)
Admission: RE | Admit: 2015-07-05 | Discharge: 2015-07-05 | Disposition: A | Discharge status: Home / Self Care | Payer: PPO | Source: Ambulatory Visit | Attending: Vascular Surgery | Admitting: Vascular Surgery

## 2015-07-05 ENCOUNTER — Encounter: Payer: Self-pay | Admitting: Vascular Surgery

## 2015-07-05 VITALS — BP 128/77 | HR 64 | Ht 67.0 in | Wt 130.7 lb

## 2015-07-05 DIAGNOSIS — I6529 Occlusion and stenosis of unspecified carotid artery: Secondary | ICD-10-CM

## 2015-07-05 DIAGNOSIS — I6523 Occlusion and stenosis of bilateral carotid arteries: Secondary | ICD-10-CM | POA: Diagnosis not present

## 2015-07-05 DIAGNOSIS — I10 Essential (primary) hypertension: Secondary | ICD-10-CM | POA: Insufficient documentation

## 2015-07-05 DIAGNOSIS — F329 Major depressive disorder, single episode, unspecified: Secondary | ICD-10-CM | POA: Diagnosis not present

## 2015-07-05 NOTE — Progress Notes (Signed)
[facility] [place]  Patient name: Samantha Osborne MRN: [id number] DOB: 07/28/1955 Sex: female  REASON FOR VISIT: Follow-up asymptomatic carotid disease  HPI: Samantha Osborne is a 60 y.o. female today for follow-up of her asymptomatic carotid disease. She has known bilateral moderate stenosis. She's had no neurologic deficits since our last visit 1 year ago. She was admitted overnight with chest pain [facility]. Cardiac workup was negative.  Past Medical History  Diagnosis Date  . Hypertension   . Thyroid disease   . DDD (degenerative disc disease)   . Depression   . Carotid artery occlusion   . DVT (deep venous thrombosis) ([place]) 2008    right leg    Family History  Problem Relation Age of Onset  . Heart disease Mother   . Hyperlipidemia Mother   . Heart attack Mother   . AAA (abdominal aortic aneurysm) Mother   . Cancer Father   . Heart disease Brother   . Hypertension Brother   . Hypertension Son     SOCIAL HISTORY: Social History  Substance Use Topics  . Smoking status: Current Every Day Smoker -- 1.00 packs/day    Types: Cigarettes    Start date: 03/29/1970  . Smokeless tobacco: Not on file  . Alcohol Use: No    No Known Allergies  Current Outpatient Prescriptions  Medication Sig Dispense Refill  . aspirin EC 81 MG tablet Take 81 mg by mouth daily.    . Aspirin-Acetaminophen-Caffeine (GOODY HEADACHE PO) Take 1 packet by mouth as needed (for pain).    Marland Kitchen buPROPion (WELLBUTRIN SR) 150 MG 12 hr tablet Take 1 tablet by mouth daily.    . butalbital-acetaminophen-caffeine (FIORICET, ESGIC) 50-325-40 MG tablet     . cholecalciferol (VITAMIN D) 1000 units tablet Take 1,000 Units by mouth daily.    . diazepam (VALIUM) 10 MG tablet Take 10 mg by mouth at bedtime as needed for sleep.     . methocarbamol (ROBAXIN) 500 MG tablet Take 1 tablet (500 mg total) by mouth 4 (four) times daily. 28 tablet 0  .  metoprolol tartrate (LOPRESSOR) 25 MG tablet Take 1 tablet (25 mg total) by mouth 2 (two) times daily. 180 tablet 3  . Omega-3 Fatty Acids (FISH OIL) 1000 MG CAPS Take 1 capsule by mouth daily.     Marland Kitchen oxyCODONE (OXY IR/ROXICODONE) 5 MG immediate release tablet     . pantoprazole (PROTONIX) 40 MG tablet Take 1 tablet (40 mg total) by mouth daily at 6 (six) AM. 30 tablet 1  . SYNTHROID 112 MCG tablet Take 112 mcg by mouth daily.    Marland Kitchen HYDROcodone-acetaminophen (NORCO) 10-325 MG per tablet Take 1 tablet by mouth every 4 (four) hours as needed. Reported on 07/05/2015     No current facility-administered medications for this visit.    REVIEW OF SYSTEMS:  [X]  denotes positive finding, [ ]  denotes negative finding Cardiac  Comments:  Chest pain or chest pressure:    Shortness of breath upon exertion:    Short of breath when lying flat:    Irregular heart rhythm:        Vascular    Pain in calf, thigh, or hip brought on by ambulation:    Pain in feet at night that wakes you up from your sleep:     Blood clot in your veins:    Leg swelling:         Pulmonary    Oxygen at home:  Productive cough:     Wheezing:         Neurologic    Sudden weakness in arms or legs:     Sudden numbness in arms or legs:     Sudden onset of difficulty speaking or slurred speech:    Temporary loss of vision in one eye:     Problems with dizziness:         Gastrointestinal    Blood in stool:     Vomited blood:         Genitourinary    Burning when urinating:     Blood in urine:        Psychiatric    Major depression:         Hematologic    Bleeding problems:    Problems with blood clotting too easily:        Skin    Rashes or ulcers:        Constitutional    Fever or chills:      PHYSICAL EXAM: Filed Vitals:   07/05/15 0927 07/05/15 0929  BP: 124/84 128/77  Pulse: 64   Height: 5\' 7"  (1.702 m)   Weight: 130 lb 11.2 oz (59.285 kg)   SpO2: 98%     GENERAL: The patient is a  well-nourished female, in no acute distress. The vital signs are documented above. CARDIAC: There is a regular rate and rhythm.  VASCULAR: 2+ radial pulses bilaterally PULMONARY: There is good air exchange bilaterally without wheezing or rales. MUSCULOSKELETAL: There are no major deformities or cyanosis. NEUROLOGIC: No focal weakness or paresthesias are detected. SKIN: There are no ulcers or rashes noted. PSYCHIATRIC: The patient has a normal affect.  DATA:  Carotid duplex today reveals no change from one year ago. She does moderate 40-59% stenosis in her internal carotid arteries bilaterally  MEDICAL ISSUES: Moderate bilateral carotid disease. Again discussed signs and symptoms of concern. We will see her again in one year with repeat carotid duplex. Again discussed the importance of smoking cessation. She wishes that she could but is under a great deal of stress and reports that she will continue to attempt smoking cessation. Will be seen again in one year with repeat carotid duplex    Rosetta Posner, MD [facility] Tel [phone] Pager [phone]

## 2015-07-28 DIAGNOSIS — Z6823 Body mass index (BMI) 23.0-23.9, adult: Secondary | ICD-10-CM | POA: Diagnosis not present

## 2015-07-28 DIAGNOSIS — Z01419 Encounter for gynecological examination (general) (routine) without abnormal findings: Secondary | ICD-10-CM | POA: Diagnosis not present

## 2015-08-18 NOTE — Addendum Note (Signed)
Addended by: Thresa Ross C on: 08/18/2015 02:16 PM   Modules accepted: Orders

## 2015-08-19 DIAGNOSIS — G4733 Obstructive sleep apnea (adult) (pediatric): Secondary | ICD-10-CM | POA: Diagnosis not present

## 2015-08-19 DIAGNOSIS — Z6822 Body mass index (BMI) 22.0-22.9, adult: Secondary | ICD-10-CM | POA: Diagnosis not present

## 2015-08-19 DIAGNOSIS — M159 Polyosteoarthritis, unspecified: Secondary | ICD-10-CM | POA: Diagnosis not present

## 2015-08-19 DIAGNOSIS — K219 Gastro-esophageal reflux disease without esophagitis: Secondary | ICD-10-CM | POA: Diagnosis not present

## 2015-08-19 DIAGNOSIS — M1991 Primary osteoarthritis, unspecified site: Secondary | ICD-10-CM | POA: Diagnosis not present

## 2015-08-19 DIAGNOSIS — I10 Essential (primary) hypertension: Secondary | ICD-10-CM | POA: Diagnosis not present

## 2015-08-19 DIAGNOSIS — G894 Chronic pain syndrome: Secondary | ICD-10-CM | POA: Diagnosis not present

## 2015-09-21 ENCOUNTER — Telehealth: Payer: Self-pay | Admitting: Gastroenterology

## 2015-09-21 NOTE — Telephone Encounter (Signed)
Pt is on the Oct recall list to have colonoscopy

## 2015-09-21 NOTE — Telephone Encounter (Signed)
Letter mailed

## 2015-09-28 ENCOUNTER — Ambulatory Visit: Payer: Medicare Other | Admitting: "Endocrinology

## 2015-09-29 DIAGNOSIS — K219 Gastro-esophageal reflux disease without esophagitis: Secondary | ICD-10-CM | POA: Diagnosis not present

## 2015-09-29 DIAGNOSIS — F330 Major depressive disorder, recurrent, mild: Secondary | ICD-10-CM | POA: Diagnosis not present

## 2015-09-29 DIAGNOSIS — M1991 Primary osteoarthritis, unspecified site: Secondary | ICD-10-CM | POA: Diagnosis not present

## 2015-09-29 DIAGNOSIS — Z6822 Body mass index (BMI) 22.0-22.9, adult: Secondary | ICD-10-CM | POA: Diagnosis not present

## 2015-10-20 DIAGNOSIS — N941 Unspecified dyspareunia: Secondary | ICD-10-CM | POA: Diagnosis not present

## 2015-10-20 DIAGNOSIS — N951 Menopausal and female climacteric states: Secondary | ICD-10-CM | POA: Diagnosis not present

## 2015-10-20 DIAGNOSIS — N952 Postmenopausal atrophic vaginitis: Secondary | ICD-10-CM | POA: Diagnosis not present

## 2015-11-08 DIAGNOSIS — M75101 Unspecified rotator cuff tear or rupture of right shoulder, not specified as traumatic: Secondary | ICD-10-CM | POA: Diagnosis not present

## 2015-11-08 DIAGNOSIS — F329 Major depressive disorder, single episode, unspecified: Secondary | ICD-10-CM | POA: Diagnosis not present

## 2015-11-08 DIAGNOSIS — F1120 Opioid dependence, uncomplicated: Secondary | ICD-10-CM | POA: Diagnosis not present

## 2015-11-08 DIAGNOSIS — M1991 Primary osteoarthritis, unspecified site: Secondary | ICD-10-CM | POA: Diagnosis not present

## 2015-11-08 DIAGNOSIS — F330 Major depressive disorder, recurrent, mild: Secondary | ICD-10-CM | POA: Diagnosis not present

## 2015-11-08 DIAGNOSIS — Z6822 Body mass index (BMI) 22.0-22.9, adult: Secondary | ICD-10-CM | POA: Diagnosis not present

## 2015-11-08 DIAGNOSIS — M47812 Spondylosis without myelopathy or radiculopathy, cervical region: Secondary | ICD-10-CM | POA: Diagnosis not present

## 2015-11-08 DIAGNOSIS — E271 Primary adrenocortical insufficiency: Secondary | ICD-10-CM | POA: Diagnosis not present

## 2015-11-08 DIAGNOSIS — G894 Chronic pain syndrome: Secondary | ICD-10-CM | POA: Diagnosis not present

## 2015-11-08 DIAGNOSIS — Z1389 Encounter for screening for other disorder: Secondary | ICD-10-CM | POA: Diagnosis not present

## 2015-11-23 ENCOUNTER — Other Ambulatory Visit (HOSPITAL_COMMUNITY): Payer: Self-pay | Admitting: Internal Medicine

## 2015-11-23 DIAGNOSIS — M5417 Radiculopathy, lumbosacral region: Secondary | ICD-10-CM

## 2015-11-29 ENCOUNTER — Other Ambulatory Visit (HOSPITAL_COMMUNITY): Payer: Self-pay | Admitting: Internal Medicine

## 2015-11-29 DIAGNOSIS — M5417 Radiculopathy, lumbosacral region: Secondary | ICD-10-CM

## 2015-11-29 DIAGNOSIS — M67911 Unspecified disorder of synovium and tendon, right shoulder: Secondary | ICD-10-CM

## 2015-11-29 DIAGNOSIS — M5412 Radiculopathy, cervical region: Secondary | ICD-10-CM

## 2015-11-30 ENCOUNTER — Ambulatory Visit (HOSPITAL_COMMUNITY)
Admission: RE | Admit: 2015-11-30 | Discharge: 2015-11-30 | Disposition: A | Discharge status: Home / Self Care | Payer: PPO | Source: Ambulatory Visit | Attending: Internal Medicine | Admitting: Internal Medicine

## 2015-11-30 DIAGNOSIS — M5417 Radiculopathy, lumbosacral region: Secondary | ICD-10-CM

## 2015-11-30 DIAGNOSIS — M50221 Other cervical disc displacement at C4-C5 level: Secondary | ICD-10-CM | POA: Diagnosis not present

## 2015-11-30 DIAGNOSIS — M5412 Radiculopathy, cervical region: Secondary | ICD-10-CM

## 2015-11-30 DIAGNOSIS — M659 Synovitis and tenosynovitis, unspecified: Secondary | ICD-10-CM | POA: Diagnosis not present

## 2015-11-30 DIAGNOSIS — M25411 Effusion, right shoulder: Secondary | ICD-10-CM | POA: Insufficient documentation

## 2015-11-30 DIAGNOSIS — S46011A Strain of muscle(s) and tendon(s) of the rotator cuff of right shoulder, initial encounter: Secondary | ICD-10-CM | POA: Diagnosis not present

## 2015-11-30 DIAGNOSIS — M67911 Unspecified disorder of synovium and tendon, right shoulder: Secondary | ICD-10-CM

## 2015-12-02 ENCOUNTER — Other Ambulatory Visit (HOSPITAL_COMMUNITY): Payer: Self-pay | Admitting: Internal Medicine

## 2015-12-02 DIAGNOSIS — M5136 Other intervertebral disc degeneration, lumbar region: Secondary | ICD-10-CM

## 2015-12-20 ENCOUNTER — Ambulatory Visit (HOSPITAL_COMMUNITY)
Admission: RE | Admit: 2015-12-20 | Discharge: 2015-12-20 | Disposition: A | Discharge status: Home / Self Care | Payer: PPO | Source: Ambulatory Visit | Attending: Internal Medicine | Admitting: Internal Medicine

## 2015-12-20 DIAGNOSIS — M5126 Other intervertebral disc displacement, lumbar region: Secondary | ICD-10-CM | POA: Diagnosis not present

## 2015-12-20 DIAGNOSIS — M48061 Spinal stenosis, lumbar region without neurogenic claudication: Secondary | ICD-10-CM | POA: Diagnosis not present

## 2015-12-20 DIAGNOSIS — M5136 Other intervertebral disc degeneration, lumbar region: Secondary | ICD-10-CM | POA: Diagnosis not present

## 2015-12-20 DIAGNOSIS — M545 Low back pain: Secondary | ICD-10-CM | POA: Diagnosis not present

## 2015-12-22 DIAGNOSIS — M519 Unspecified thoracic, thoracolumbar and lumbosacral intervertebral disc disorder: Secondary | ICD-10-CM | POA: Diagnosis not present

## 2016-01-13 DIAGNOSIS — M4727 Other spondylosis with radiculopathy, lumbosacral region: Secondary | ICD-10-CM | POA: Diagnosis not present

## 2016-01-13 DIAGNOSIS — M549 Dorsalgia, unspecified: Secondary | ICD-10-CM | POA: Diagnosis not present

## 2016-01-13 DIAGNOSIS — M542 Cervicalgia: Secondary | ICD-10-CM | POA: Diagnosis not present

## 2016-01-13 DIAGNOSIS — M4722 Other spondylosis with radiculopathy, cervical region: Secondary | ICD-10-CM | POA: Diagnosis not present

## 2016-02-03 DIAGNOSIS — M25511 Pain in right shoulder: Secondary | ICD-10-CM | POA: Diagnosis not present

## 2016-02-14 DIAGNOSIS — Z6821 Body mass index (BMI) 21.0-21.9, adult: Secondary | ICD-10-CM | POA: Diagnosis not present

## 2016-02-14 DIAGNOSIS — G894 Chronic pain syndrome: Secondary | ICD-10-CM | POA: Diagnosis not present

## 2016-02-17 DIAGNOSIS — R300 Dysuria: Secondary | ICD-10-CM | POA: Diagnosis not present

## 2016-03-02 DIAGNOSIS — N952 Postmenopausal atrophic vaginitis: Secondary | ICD-10-CM | POA: Diagnosis not present

## 2016-03-02 DIAGNOSIS — Z6822 Body mass index (BMI) 22.0-22.9, adult: Secondary | ICD-10-CM | POA: Diagnosis not present

## 2016-03-05 ENCOUNTER — Other Ambulatory Visit (HOSPITAL_COMMUNITY): Payer: Self-pay | Admitting: Internal Medicine

## 2016-03-05 DIAGNOSIS — M25551 Pain in right hip: Secondary | ICD-10-CM

## 2016-03-12 ENCOUNTER — Ambulatory Visit (HOSPITAL_COMMUNITY)
Admission: RE | Admit: 2016-03-12 | Discharge: 2016-03-12 | Disposition: A | Discharge status: Home / Self Care | Payer: PPO | Source: Ambulatory Visit | Attending: Internal Medicine | Admitting: Internal Medicine

## 2016-03-12 DIAGNOSIS — M1611 Unilateral primary osteoarthritis, right hip: Secondary | ICD-10-CM | POA: Insufficient documentation

## 2016-03-12 DIAGNOSIS — M25551 Pain in right hip: Secondary | ICD-10-CM | POA: Diagnosis not present

## 2016-03-15 DIAGNOSIS — Z6821 Body mass index (BMI) 21.0-21.9, adult: Secondary | ICD-10-CM | POA: Diagnosis not present

## 2016-03-15 DIAGNOSIS — R5383 Other fatigue: Secondary | ICD-10-CM | POA: Diagnosis not present

## 2016-03-15 DIAGNOSIS — M2550 Pain in unspecified joint: Secondary | ICD-10-CM | POA: Diagnosis not present

## 2016-03-15 DIAGNOSIS — M542 Cervicalgia: Secondary | ICD-10-CM | POA: Diagnosis not present

## 2016-03-15 DIAGNOSIS — M150 Primary generalized (osteo)arthritis: Secondary | ICD-10-CM | POA: Diagnosis not present

## 2016-03-15 DIAGNOSIS — M545 Low back pain: Secondary | ICD-10-CM | POA: Diagnosis not present

## 2016-03-28 ENCOUNTER — Ambulatory Visit (INDEPENDENT_AMBULATORY_CARE_PROVIDER_SITE_OTHER): Payer: Medicare Other | Admitting: Orthopaedic Surgery

## 2016-04-04 ENCOUNTER — Encounter (INDEPENDENT_AMBULATORY_CARE_PROVIDER_SITE_OTHER): Payer: Self-pay | Admitting: Orthopaedic Surgery

## 2016-04-04 ENCOUNTER — Other Ambulatory Visit (INDEPENDENT_AMBULATORY_CARE_PROVIDER_SITE_OTHER): Payer: Self-pay | Admitting: Orthopaedic Surgery

## 2016-04-04 ENCOUNTER — Ambulatory Visit (INDEPENDENT_AMBULATORY_CARE_PROVIDER_SITE_OTHER): Payer: PPO | Admitting: Orthopaedic Surgery

## 2016-04-04 ENCOUNTER — Ambulatory Visit (INDEPENDENT_AMBULATORY_CARE_PROVIDER_SITE_OTHER): Payer: PPO

## 2016-04-04 VITALS — BP 152/83 | HR 61 | Resp 14 | Ht 64.0 in | Wt 125.0 lb

## 2016-04-04 DIAGNOSIS — M25551 Pain in right hip: Secondary | ICD-10-CM

## 2016-04-04 NOTE — Progress Notes (Signed)
   Office Visit Note   Patient: Samantha Osborne           Date of Birth: May 01, 1955           MRN: [id number] Visit Date: 04/04/2016              Requested by: Redmond School, MD [address] [place], [place] [address] PCP: Glo Herring., MD   Assessment & Plan: Visit Diagnoses: chronic right hip pain with either primary hip OA or referred from chronic LBP  Plan: intra articular right hip inj, monitor response and f/u 1 month  Follow-Up Instructions: No Follow-up on file.   Orders:  No orders of the defined types were placed in this encounter.  No orders of the defined types were placed in this encounter.     Procedures: No procedures performed   Clinical Data: No additional findings.   Subjective: No chief complaint on file.   Ms. Mooty presents today with chronic right hip pain. MRI on Epic from [facility]. (PCP)denies injury, and relates the pain travels down the front of her right leg.  Chronic hx of LBP treated with oxycodone and tramadol per Primary care physician. Having right groin and ant thigh pain with recent MRI c/w mild OA right hip  Review of Systems   Objective: Vital Signs: There were no vitals taken for this visit.  Physical Exam  Ortho Exampainless ROM right and left hip, Figure 4 testing symmetrical without loss of motion. Skin intact. SLR neg bilaterally but with mild percussible tenderness to palpation of L-S spine  Specialty Comments:  No specialty comments available.  Imaging: No results found.   PMFS History: Patient Active Problem List   Diagnosis Date Noted  . Chest pain 03/15/2015  . Atypical chest pain 03/15/2015  . Hyperlipidemia 03/15/2015  . GERD (gastroesophageal reflux disease) 03/15/2015  . 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 Other specified hypothyroidism 12/03/2014  . Carotid stenosis 10/27/2013   Past Medical History:  Diagnosis Date  . Carotid artery occlusion   . DDD (degenerative disc disease)    . Depression   . DVT (deep venous thrombosis) ([place]) 2008   right leg  . Hypertension   . Thyroid disease     Family History  Problem Relation Age of Onset  . Heart disease Mother   . Hyperlipidemia Mother   . Heart attack Mother   . AAA (abdominal aortic aneurysm) Mother   . Cancer Father   . Heart disease Brother   . Hypertension Brother   . Hypertension Son     Past Surgical History:  Procedure Laterality Date  . CESAREAN SECTION    . DILATION AND CURETTAGE OF UTERUS    . SHOULDER ARTHROSCOPY Left 2005   Social History   Occupational History  . Not on file.   Social History Main Topics  . Smoking status: Current Every Day Smoker    Packs/day: 1.00    Types: Cigarettes    Start date: 03/29/1970  . Smokeless tobacco: Not on file  . Alcohol use No  . Drug use: No  . Sexual activity: Yes

## 2016-04-09 ENCOUNTER — Ambulatory Visit (HOSPITAL_COMMUNITY)
Admission: RE | Admit: 2016-04-09 | Discharge: 2016-04-09 | Disposition: A | Discharge status: Home / Self Care | Payer: PPO | Source: Ambulatory Visit | Attending: Orthopaedic Surgery | Admitting: Orthopaedic Surgery

## 2016-04-09 DIAGNOSIS — M25551 Pain in right hip: Secondary | ICD-10-CM | POA: Insufficient documentation

## 2016-04-09 MED ORDER — LIDOCAINE HCL (PF) 1 % IJ SOLN
INTRAMUSCULAR | Status: AC
  Filled 2016-04-09: qty 5

## 2016-04-09 MED ORDER — IOPAMIDOL (ISOVUE-300) INJECTION 61%
INTRAVENOUS | Status: AC
  Filled 2016-04-09: qty 75

## 2016-04-09 MED ORDER — IOPAMIDOL (ISOVUE-300) INJECTION 61%
INTRAVENOUS | Status: AC
  Filled 2016-04-09: qty 50

## 2016-04-09 MED ORDER — POVIDONE-IODINE 10 % EX SOLN
CUTANEOUS | Status: AC
  Filled 2016-04-09: qty 15

## 2016-04-09 MED ORDER — LIDOCAINE HCL (PF) 2 % IJ SOLN
INTRAMUSCULAR | Status: AC
  Filled 2016-04-09: qty 10

## 2016-04-09 MED ORDER — LIDOCAINE HCL (PF) 1 % IJ SOLN: 5.0000 mL | Freq: Once | INTRAMUSCULAR | Status: DC

## 2016-04-09 MED ORDER — LIDOCAINE HCL (PF) 2 % IJ SOLN: 10.0000 mL | Freq: Once | INTRAMUSCULAR | Status: DC

## 2016-04-09 MED ORDER — IOPAMIDOL (ISOVUE-300) INJECTION 61%
50.0000 mL | Freq: Once | INTRAVENOUS | Status: AC | PRN
  Administered 2016-04-09: 1 mL

## 2016-04-09 MED ORDER — METHYLPREDNISOLONE ACETATE 40 MG/ML IJ SUSP
INTRAMUSCULAR | Status: AC
  Filled 2016-04-09: qty 2

## 2016-04-09 MED ORDER — METHYLPREDNISOLONE ACETATE 40 MG/ML INJ SUSP (RADIOLOG: 80.0000 mg | Freq: Once | INTRAMUSCULAR | Status: DC

## 2016-04-09 NOTE — Procedures (Signed)
Preprocedure Dx: RT hip pain Postprocedure Dx: RT hip pain Procedure  Fluoroscopically guided RT hip joint injection, therapeutic Radiologist:  Thornton Papas Anesthesia:  3 ml of 1% lidocaine Injectate:  80 mg depo-medrol, 4 ml of 2% lidocaine Fluoro time:  0 minutes 48 seconds EBL:   None Complications: None - patient reports resolution of her symptoms following injection

## 2016-04-17 DIAGNOSIS — F330 Major depressive disorder, recurrent, mild: Secondary | ICD-10-CM | POA: Diagnosis not present

## 2016-04-17 DIAGNOSIS — E2740 Unspecified adrenocortical insufficiency: Secondary | ICD-10-CM | POA: Diagnosis not present

## 2016-04-17 DIAGNOSIS — F329 Major depressive disorder, single episode, unspecified: Secondary | ICD-10-CM | POA: Diagnosis not present

## 2016-04-17 DIAGNOSIS — Z719 Counseling, unspecified: Secondary | ICD-10-CM | POA: Diagnosis not present

## 2016-04-17 DIAGNOSIS — F1120 Opioid dependence, uncomplicated: Secondary | ICD-10-CM | POA: Diagnosis not present

## 2016-04-17 DIAGNOSIS — M1991 Primary osteoarthritis, unspecified site: Secondary | ICD-10-CM | POA: Diagnosis not present

## 2016-04-17 DIAGNOSIS — Z6822 Body mass index (BMI) 22.0-22.9, adult: Secondary | ICD-10-CM | POA: Diagnosis not present

## 2016-04-17 DIAGNOSIS — G894 Chronic pain syndrome: Secondary | ICD-10-CM | POA: Diagnosis not present

## 2016-06-04 DIAGNOSIS — Z6822 Body mass index (BMI) 22.0-22.9, adult: Secondary | ICD-10-CM | POA: Diagnosis not present

## 2016-06-04 DIAGNOSIS — N952 Postmenopausal atrophic vaginitis: Secondary | ICD-10-CM | POA: Diagnosis not present

## 2016-07-02 ENCOUNTER — Encounter: Payer: Self-pay | Admitting: Family

## 2016-07-10 ENCOUNTER — Ambulatory Visit: Payer: PPO | Admitting: Family

## 2016-07-10 ENCOUNTER — Ambulatory Visit (HOSPITAL_COMMUNITY): Payer: PPO

## 2016-07-13 DIAGNOSIS — E271 Primary adrenocortical insufficiency: Secondary | ICD-10-CM | POA: Diagnosis not present

## 2016-07-13 DIAGNOSIS — G894 Chronic pain syndrome: Secondary | ICD-10-CM | POA: Diagnosis not present

## 2016-07-13 DIAGNOSIS — M1991 Primary osteoarthritis, unspecified site: Secondary | ICD-10-CM | POA: Diagnosis not present

## 2016-07-13 DIAGNOSIS — Z6822 Body mass index (BMI) 22.0-22.9, adult: Secondary | ICD-10-CM | POA: Diagnosis not present

## 2016-07-13 DIAGNOSIS — E063 Autoimmune thyroiditis: Secondary | ICD-10-CM | POA: Diagnosis not present

## 2016-07-13 DIAGNOSIS — I10 Essential (primary) hypertension: Secondary | ICD-10-CM | POA: Diagnosis not present

## 2016-07-13 DIAGNOSIS — G459 Transient cerebral ischemic attack, unspecified: Secondary | ICD-10-CM | POA: Diagnosis not present

## 2016-07-13 DIAGNOSIS — Z1389 Encounter for screening for other disorder: Secondary | ICD-10-CM | POA: Diagnosis not present

## 2016-07-17 ENCOUNTER — Telehealth: Payer: Self-pay

## 2016-07-17 NOTE — Telephone Encounter (Signed)
SENT NOTES TO SCHEDULING 

## 2016-07-18 ENCOUNTER — Other Ambulatory Visit: Payer: Self-pay

## 2016-07-19 ENCOUNTER — Other Ambulatory Visit: Payer: Self-pay | Admitting: Vascular Surgery

## 2016-08-09 DIAGNOSIS — I10 Essential (primary) hypertension: Secondary | ICD-10-CM | POA: Diagnosis not present

## 2016-08-09 DIAGNOSIS — G894 Chronic pain syndrome: Secondary | ICD-10-CM | POA: Diagnosis not present

## 2016-08-09 DIAGNOSIS — F1120 Opioid dependence, uncomplicated: Secondary | ICD-10-CM | POA: Diagnosis not present

## 2016-08-09 DIAGNOSIS — E271 Primary adrenocortical insufficiency: Secondary | ICD-10-CM | POA: Diagnosis not present

## 2016-08-09 DIAGNOSIS — Z1389 Encounter for screening for other disorder: Secondary | ICD-10-CM | POA: Diagnosis not present

## 2016-08-09 DIAGNOSIS — Z0000 Encounter for general adult medical examination without abnormal findings: Secondary | ICD-10-CM | POA: Diagnosis not present

## 2016-08-09 DIAGNOSIS — Z0001 Encounter for general adult medical examination with abnormal findings: Secondary | ICD-10-CM | POA: Diagnosis not present

## 2016-08-09 DIAGNOSIS — Z6822 Body mass index (BMI) 22.0-22.9, adult: Secondary | ICD-10-CM | POA: Diagnosis not present

## 2016-08-09 DIAGNOSIS — M1991 Primary osteoarthritis, unspecified site: Secondary | ICD-10-CM | POA: Diagnosis not present

## 2016-08-12 DIAGNOSIS — Z1211 Encounter for screening for malignant neoplasm of colon: Secondary | ICD-10-CM | POA: Diagnosis not present

## 2016-08-13 ENCOUNTER — Encounter: Payer: Self-pay | Admitting: Vascular Surgery

## 2016-08-15 ENCOUNTER — Encounter (HOSPITAL_COMMUNITY): Payer: PPO

## 2016-08-15 ENCOUNTER — Encounter: Payer: PPO | Admitting: Surgery

## 2016-08-21 ENCOUNTER — Ambulatory Visit: Payer: PPO | Admitting: Pharmacist

## 2016-08-21 ENCOUNTER — Encounter: Payer: Self-pay | Admitting: Vascular Surgery

## 2016-08-21 ENCOUNTER — Ambulatory Visit (HOSPITAL_COMMUNITY)
Admission: RE | Admit: 2016-08-21 | Discharge: 2016-08-21 | Disposition: A | Discharge status: Home / Self Care | Payer: PPO | Source: Ambulatory Visit | Attending: Vascular Surgery | Admitting: Vascular Surgery

## 2016-08-21 ENCOUNTER — Ambulatory Visit (INDEPENDENT_AMBULATORY_CARE_PROVIDER_SITE_OTHER): Payer: PPO | Admitting: Vascular Surgery

## 2016-08-21 VITALS — BP 147/89 | HR 72 | Temp 97.9°F | Resp 16 | Ht 64.0 in | Wt 131.0 lb

## 2016-08-21 DIAGNOSIS — I6529 Occlusion and stenosis of unspecified carotid artery: Secondary | ICD-10-CM | POA: Diagnosis not present

## 2016-08-21 DIAGNOSIS — I6523 Occlusion and stenosis of bilateral carotid arteries: Secondary | ICD-10-CM | POA: Diagnosis not present

## 2016-08-21 LAB — VAS US CAROTID
LCCAPSYS: 87 cm/s
LEFT ECA DIAS: 24 cm/s
LEFT VERTEBRAL DIAS: 27 cm/s
LICAPDIAS: -71 cm/s
LICAPSYS: -237 cm/s
Left CCA dist dias: -25 cm/s
Left CCA dist sys: -66 cm/s
Left CCA prox dias: 26 cm/s
RCCAPSYS: 78 cm/s
RIGHT CCA MID DIAS: 22 cm/s
RIGHT ECA DIAS: 10 cm/s
Right CCA prox dias: 20 cm/s
Right cca dist sys: 72 cm/s

## 2016-08-21 NOTE — Progress Notes (Signed)
[facility] [place]  Patient name: Samantha Osborne MRN: [id number] DOB: 07-Jan-1956 Sex: female  REASON FOR VISIT: Follow-up carotid disease.  HPI: Samantha Osborne is a 61 y.o. female here today for follow-up. She reports recent events of pain and numb sensation where she reports waves of numbness from her back in the posterior aspect of her thighs down into her calves. This is equal right and left leg. Can last for a minute and then completely resolved. This can be related to activity particularly when arising. She denies any focal unilateral deficits. She does have known moderate arterial insufficiency bilaterally. She's had no aphasia and no visual changes. She does have degenerative disc disease in her back.  Past Medical History:  Diagnosis Date  . Carotid artery occlusion   . DDD (degenerative disc disease)   . Depression   . DVT (deep venous thrombosis) ([place]) 2008   right leg  . Hypertension   . Thyroid disease     Family History  Problem Relation Age of Onset  . Heart disease Mother   . Hyperlipidemia Mother   . Heart attack Mother   . AAA (abdominal aortic aneurysm) Mother   . Cancer Father   . Heart disease Brother   . Hypertension Brother   . Hypertension Son     SOCIAL HISTORY: Social History  Substance Use Topics  . Smoking status: Current Every Day Smoker    Packs/day: 1.00    Types: Cigarettes    Start date: 03/29/1970  . Smokeless tobacco: Never Used  . Alcohol use No    No Known Allergies  Current Outpatient Prescriptions  Medication Sig Dispense Refill  . aspirin EC 81 MG tablet Take 81 mg by mouth daily.    . Aspirin-Acetaminophen-Caffeine (GOODY HEADACHE PO) Take 1 packet by mouth as needed (for pain).    Marland Kitchen buPROPion (WELLBUTRIN SR) 150 MG 12 hr tablet Take 1 tablet by mouth daily.    . butalbital-acetaminophen-caffeine (FIORICET, ESGIC) 50-325-40 MG tablet     . cholecalciferol (VITAMIN D) 1000  units tablet Take 1,000 Units by mouth daily.    . diazepam (VALIUM) 10 MG tablet Take 10 mg by mouth at bedtime as needed for sleep.     Marland Kitchen HYDROcodone-acetaminophen (NORCO) 10-325 MG per tablet Take 1 tablet by mouth every 4 (four) hours as needed. Reported on 07/05/2015    . methocarbamol (ROBAXIN) 500 MG tablet Take 1 tablet (500 mg total) by mouth 4 (four) times daily. 28 tablet 0  . metoprolol tartrate (LOPRESSOR) 25 MG tablet Take 1 tablet (25 mg total) by mouth 2 (two) times daily. 180 tablet 3  . Omega-3 Fatty Acids (FISH OIL) 1000 MG CAPS Take 1 capsule by mouth daily.     Marland Kitchen oxyCODONE (OXY IR/ROXICODONE) 5 MG immediate release tablet 15 mg.     . pantoprazole (PROTONIX) 40 MG tablet Take 1 tablet (40 mg total) by mouth daily at 6 (six) AM. 30 tablet 1  . SYNTHROID 112 MCG tablet Take 112 mcg by mouth daily.     No current facility-administered medications for this visit.     REVIEW OF SYSTEMS:  [X]  denotes positive finding, [ ]  denotes negative finding Cardiac  Comments:  Chest pain or chest pressure:    Shortness of breath upon exertion:    Short of breath when lying flat:    Irregular heart rhythm:        Vascular    Pain in calf,  thigh, or hip brought on by ambulation:    Pain in feet at night that wakes you up from your sleep:     Blood clot in your veins:    Leg swelling:           PHYSICAL EXAM: Vitals:   08/21/16 1419 08/21/16 1425  BP: 140/79 (!) 147/89  Pulse: 72   Resp: 16   Temp: 97.9 F (36.6 C)   TempSrc: Oral   SpO2: 96%   Weight: 131 lb (59.4 kg)   Height: 5\' 4"  (1.626 m)     GENERAL: The patient is a well-nourished female, in no acute distress. The vital signs are documented above. CARDIOVASCULAR: 2+ radial 2+ femoral and 2+ dorsalis pedis pulses bilaterally. Carotid arteries without bruits bilaterally PULMONARY: There is good air exchange  MUSCULOSKELETAL: There are no major deformities or cyanosis. NEUROLOGIC: No focal weakness or paresthesias  are detected. SKIN: There are no ulcers or rashes noted. PSYCHIATRIC: The patient has a normal affect.  DATA:  Carotid duplex today reveals moderate stenoses bilaterally she has no change in the right side from her study one year ago with a predicted 40-59% stenosis. She's had a slight increase on the left with a predicted 60-79% stenosis.  MEDICAL ISSUES: Moderate asymptomatic carotid disease. Again discussed symptoms of carotid disease with patient she will notify me these should this occur. I do not feel her bilateral lower extremity symptoms are related to this. She has seen Dr.Ditty in the past for evaluation and I have encouraged her to see him again for further discussion. We will see her in one year with repeat carotid duplex    Rosetta Posner, MD [facility] Tel [phone] Pager [phone]

## 2016-08-21 NOTE — Progress Notes (Deleted)
Patient ID: Samantha Osborne                 DOB: [birth year]                    MRN: [id number]     HPI: Samantha Osborne is a 61 y.o. female patient of Dr Harl Bowie referred to lipid clinic by PCP Dr Redmond School with [facility]. PMH is significant for 40-59% bilateral moderate stenosis of ICA followed by vascular, HLD with statin tolerance, chronic pain - on opioids, and HTN.  Received chart notes from PCP - pt with "carotid artery stenosis and intermittent left upper extremity paraesthesias suggestive of TIA."  Clear trial - carotid stenosis needs to be at least 70% No records of statins pt has tried..  Current Medications: none Intolerances: Zetia 10mg  daily (myalgias) Risk Factors: 40-59% bilateral moderate ICA stenosis, possible TIA LDL goal: 70mg /dL  Diet:   Exercise:   Family History: Mother with history of heart disease, HLD, AAA, and heart attack. Brother with history of heart disease and HTN.  Social History: Samantha Osborne reports that she has been smoking Cigarettes.  She has been smoking about 1.00 pack per day. She does not have any smokeless tobacco history on file. Samantha Osborne reports that she does not drink alcohol.  Labs: 09/28/14: TC 244, TG 194, HDL 43, LDL 162 12/23/13: TC 237, TG 135, HDL 51, LDL 159  Past Medical History:  Diagnosis Date  . Carotid artery occlusion   . DDD (degenerative disc disease)   . Depression   . DVT (deep venous thrombosis) ([place]) 2008   right leg  . Hypertension   . Thyroid disease     Current Outpatient Prescriptions on File Prior to Visit  Medication Sig Dispense Refill  . aspirin EC 81 MG tablet Take 81 mg by mouth daily.    . Aspirin-Acetaminophen-Caffeine (GOODY HEADACHE PO) Take 1 packet by mouth as needed (for pain).    Marland Kitchen buPROPion (WELLBUTRIN SR) 150 MG 12 hr tablet Take 1 tablet by mouth daily.    . butalbital-acetaminophen-caffeine (FIORICET, ESGIC) 50-325-40 MG tablet     . cholecalciferol (VITAMIN D) 1000  units tablet Take 1,000 Units by mouth daily.    . diazepam (VALIUM) 10 MG tablet Take 10 mg by mouth at bedtime as needed for sleep.     Marland Kitchen HYDROcodone-acetaminophen (NORCO) 10-325 MG per tablet Take 1 tablet by mouth every 4 (four) hours as needed. Reported on 07/05/2015    . methocarbamol (ROBAXIN) 500 MG tablet Take 1 tablet (500 mg total) by mouth 4 (four) times daily. 28 tablet 0  . metoprolol tartrate (LOPRESSOR) 25 MG tablet Take 1 tablet (25 mg total) by mouth 2 (two) times daily. 180 tablet 3  . Omega-3 Fatty Acids (FISH OIL) 1000 MG CAPS Take 1 capsule by mouth daily.     Marland Kitchen oxyCODONE (OXY IR/ROXICODONE) 5 MG immediate release tablet     . pantoprazole (PROTONIX) 40 MG tablet Take 1 tablet (40 mg total) by mouth daily at 6 (six) AM. (Patient not taking: Reported on 04/04/2016) 30 tablet 1  . SYNTHROID 112 MCG tablet Take 112 mcg by mouth daily.     No current facility-administered medications on file prior to visit.     No Known Allergies  Assessment/Plan:

## 2016-09-06 DIAGNOSIS — Z1151 Encounter for screening for human papillomavirus (HPV): Secondary | ICD-10-CM | POA: Insufficient documentation

## 2016-09-06 DIAGNOSIS — E0500 Thyrotoxicosis with diffuse goiter without thyrotoxic crisis or storm: Secondary | ICD-10-CM | POA: Insufficient documentation

## 2016-09-06 DIAGNOSIS — N952 Postmenopausal atrophic vaginitis: Secondary | ICD-10-CM | POA: Insufficient documentation

## 2016-09-06 DIAGNOSIS — N951 Menopausal and female climacteric states: Secondary | ICD-10-CM | POA: Insufficient documentation

## 2016-09-06 DIAGNOSIS — N941 Unspecified dyspareunia: Secondary | ICD-10-CM | POA: Insufficient documentation

## 2016-09-12 NOTE — Addendum Note (Signed)
Addended by: Lianne Cure A on: 09/12/2016 01:28 PM   Modules accepted: Orders

## 2016-09-13 DIAGNOSIS — N952 Postmenopausal atrophic vaginitis: Secondary | ICD-10-CM | POA: Diagnosis not present

## 2016-10-25 DIAGNOSIS — Z6823 Body mass index (BMI) 23.0-23.9, adult: Secondary | ICD-10-CM | POA: Diagnosis not present

## 2016-10-25 DIAGNOSIS — G894 Chronic pain syndrome: Secondary | ICD-10-CM | POA: Diagnosis not present

## 2016-10-25 DIAGNOSIS — F1120 Opioid dependence, uncomplicated: Secondary | ICD-10-CM | POA: Diagnosis not present

## 2016-10-25 DIAGNOSIS — M47816 Spondylosis without myelopathy or radiculopathy, lumbar region: Secondary | ICD-10-CM | POA: Diagnosis not present

## 2016-10-25 DIAGNOSIS — F329 Major depressive disorder, single episode, unspecified: Secondary | ICD-10-CM | POA: Diagnosis not present

## 2016-10-25 DIAGNOSIS — M2550 Pain in unspecified joint: Secondary | ICD-10-CM | POA: Diagnosis not present

## 2016-10-25 DIAGNOSIS — E063 Autoimmune thyroiditis: Secondary | ICD-10-CM | POA: Diagnosis not present

## 2016-10-25 DIAGNOSIS — M1991 Primary osteoarthritis, unspecified site: Secondary | ICD-10-CM | POA: Diagnosis not present

## 2016-10-25 DIAGNOSIS — E271 Primary adrenocortical insufficiency: Secondary | ICD-10-CM | POA: Diagnosis not present

## 2016-10-25 DIAGNOSIS — F330 Major depressive disorder, recurrent, mild: Secondary | ICD-10-CM | POA: Diagnosis not present

## 2016-12-07 DIAGNOSIS — M1991 Primary osteoarthritis, unspecified site: Secondary | ICD-10-CM | POA: Diagnosis not present

## 2016-12-07 DIAGNOSIS — G894 Chronic pain syndrome: Secondary | ICD-10-CM | POA: Diagnosis not present

## 2016-12-07 DIAGNOSIS — E063 Autoimmune thyroiditis: Secondary | ICD-10-CM | POA: Diagnosis not present

## 2016-12-07 DIAGNOSIS — Z1389 Encounter for screening for other disorder: Secondary | ICD-10-CM | POA: Diagnosis not present

## 2016-12-07 DIAGNOSIS — B80 Enterobiasis: Secondary | ICD-10-CM | POA: Diagnosis not present

## 2016-12-07 DIAGNOSIS — E271 Primary adrenocortical insufficiency: Secondary | ICD-10-CM | POA: Diagnosis not present

## 2016-12-07 DIAGNOSIS — Z6823 Body mass index (BMI) 23.0-23.9, adult: Secondary | ICD-10-CM | POA: Diagnosis not present

## 2017-02-05 DIAGNOSIS — N952 Postmenopausal atrophic vaginitis: Secondary | ICD-10-CM | POA: Diagnosis not present

## 2017-02-05 DIAGNOSIS — R21 Rash and other nonspecific skin eruption: Secondary | ICD-10-CM | POA: Diagnosis not present

## 2017-03-25 ENCOUNTER — Other Ambulatory Visit (HOSPITAL_COMMUNITY): Payer: Self-pay | Admitting: Internal Medicine

## 2017-03-25 DIAGNOSIS — Z6821 Body mass index (BMI) 21.0-21.9, adult: Secondary | ICD-10-CM | POA: Diagnosis not present

## 2017-03-25 DIAGNOSIS — G4730 Sleep apnea, unspecified: Secondary | ICD-10-CM | POA: Diagnosis not present

## 2017-03-25 DIAGNOSIS — M1991 Primary osteoarthritis, unspecified site: Secondary | ICD-10-CM | POA: Diagnosis not present

## 2017-03-25 DIAGNOSIS — G894 Chronic pain syndrome: Secondary | ICD-10-CM | POA: Diagnosis not present

## 2017-03-25 DIAGNOSIS — K219 Gastro-esophageal reflux disease without esophagitis: Secondary | ICD-10-CM | POA: Diagnosis not present

## 2017-03-25 DIAGNOSIS — Z1231 Encounter for screening mammogram for malignant neoplasm of breast: Secondary | ICD-10-CM

## 2017-03-25 DIAGNOSIS — I10 Essential (primary) hypertension: Secondary | ICD-10-CM | POA: Diagnosis not present

## 2017-03-25 DIAGNOSIS — E271 Primary adrenocortical insufficiency: Secondary | ICD-10-CM | POA: Diagnosis not present

## 2017-03-25 DIAGNOSIS — F1120 Opioid dependence, uncomplicated: Secondary | ICD-10-CM | POA: Diagnosis not present

## 2017-03-25 DIAGNOSIS — Z139 Encounter for screening, unspecified: Secondary | ICD-10-CM | POA: Diagnosis not present

## 2017-03-25 DIAGNOSIS — R531 Weakness: Secondary | ICD-10-CM | POA: Diagnosis not present

## 2017-04-05 DIAGNOSIS — Z1389 Encounter for screening for other disorder: Secondary | ICD-10-CM | POA: Diagnosis not present

## 2017-04-05 DIAGNOSIS — E782 Mixed hyperlipidemia: Secondary | ICD-10-CM | POA: Diagnosis not present

## 2017-04-05 DIAGNOSIS — R7309 Other abnormal glucose: Secondary | ICD-10-CM | POA: Diagnosis not present

## 2017-04-05 DIAGNOSIS — E748 Other specified disorders of carbohydrate metabolism: Secondary | ICD-10-CM | POA: Diagnosis not present

## 2017-04-05 DIAGNOSIS — E271 Primary adrenocortical insufficiency: Secondary | ICD-10-CM | POA: Diagnosis not present

## 2017-04-11 ENCOUNTER — Ambulatory Visit (HOSPITAL_COMMUNITY): Payer: PPO

## 2017-04-11 DIAGNOSIS — R5383 Other fatigue: Secondary | ICD-10-CM | POA: Diagnosis not present

## 2017-04-16 DIAGNOSIS — Z1231 Encounter for screening mammogram for malignant neoplasm of breast: Secondary | ICD-10-CM | POA: Diagnosis not present

## 2017-05-22 DIAGNOSIS — E782 Mixed hyperlipidemia: Secondary | ICD-10-CM | POA: Diagnosis not present

## 2017-05-22 DIAGNOSIS — N941 Unspecified dyspareunia: Secondary | ICD-10-CM | POA: Diagnosis not present

## 2017-05-22 DIAGNOSIS — M1991 Primary osteoarthritis, unspecified site: Secondary | ICD-10-CM | POA: Diagnosis not present

## 2017-05-22 DIAGNOSIS — G894 Chronic pain syndrome: Secondary | ICD-10-CM | POA: Diagnosis not present

## 2017-05-22 DIAGNOSIS — Z681 Body mass index (BMI) 19 or less, adult: Secondary | ICD-10-CM | POA: Diagnosis not present

## 2017-05-22 DIAGNOSIS — N952 Postmenopausal atrophic vaginitis: Secondary | ICD-10-CM | POA: Diagnosis not present

## 2017-05-22 DIAGNOSIS — E271 Primary adrenocortical insufficiency: Secondary | ICD-10-CM | POA: Diagnosis not present

## 2017-08-12 ENCOUNTER — Other Ambulatory Visit: Payer: Self-pay

## 2017-08-12 DIAGNOSIS — M75102 Unspecified rotator cuff tear or rupture of left shoulder, not specified as traumatic: Secondary | ICD-10-CM | POA: Diagnosis not present

## 2017-08-12 DIAGNOSIS — G4733 Obstructive sleep apnea (adult) (pediatric): Secondary | ICD-10-CM | POA: Diagnosis not present

## 2017-08-12 DIAGNOSIS — Z0001 Encounter for general adult medical examination with abnormal findings: Secondary | ICD-10-CM | POA: Diagnosis not present

## 2017-08-12 DIAGNOSIS — F419 Anxiety disorder, unspecified: Secondary | ICD-10-CM | POA: Diagnosis not present

## 2017-08-12 DIAGNOSIS — R0789 Other chest pain: Secondary | ICD-10-CM | POA: Diagnosis not present

## 2017-08-12 DIAGNOSIS — I6523 Occlusion and stenosis of bilateral carotid arteries: Secondary | ICD-10-CM

## 2017-08-12 DIAGNOSIS — M79621 Pain in right upper arm: Secondary | ICD-10-CM | POA: Diagnosis not present

## 2017-08-12 DIAGNOSIS — G43909 Migraine, unspecified, not intractable, without status migrainosus: Secondary | ICD-10-CM | POA: Diagnosis not present

## 2017-08-12 DIAGNOSIS — I10 Essential (primary) hypertension: Secondary | ICD-10-CM | POA: Diagnosis not present

## 2017-08-12 DIAGNOSIS — Z6821 Body mass index (BMI) 21.0-21.9, adult: Secondary | ICD-10-CM | POA: Diagnosis not present

## 2017-08-27 ENCOUNTER — Ambulatory Visit (INDEPENDENT_AMBULATORY_CARE_PROVIDER_SITE_OTHER): Payer: PPO | Admitting: Vascular Surgery

## 2017-08-27 ENCOUNTER — Ambulatory Visit (HOSPITAL_COMMUNITY)
Admission: RE | Admit: 2017-08-27 | Discharge: 2017-08-27 | Disposition: A | Discharge status: Home / Self Care | Payer: PPO | Source: Ambulatory Visit | Attending: Vascular Surgery | Admitting: Vascular Surgery

## 2017-08-27 ENCOUNTER — Other Ambulatory Visit: Payer: Self-pay

## 2017-08-27 ENCOUNTER — Encounter: Payer: Self-pay | Admitting: Vascular Surgery

## 2017-08-27 VITALS — BP 120/85 | HR 64 | Temp 98.8°F | Resp 16 | Ht 64.0 in | Wt 122.0 lb

## 2017-08-27 DIAGNOSIS — I6523 Occlusion and stenosis of bilateral carotid arteries: Secondary | ICD-10-CM | POA: Insufficient documentation

## 2017-08-27 NOTE — Progress Notes (Signed)
V[facility]ist of [place]  Patient name: Samantha Osborne MRN: [id number] DOB: 04/26/1955 Sex: female  REASON FOR VISIT: Follow-up known carotid stenosis  HPI: Samantha Osborne is a 62 y.o. female here today for follow-up.  She has had no neurologic deficits.  She does report some twitching sensation in her right arm and is may potentially be related to cervical disc disease.  She does have known lumbar degenerative disc disease.  She specifically denies any focal weakness or a aphasia or amaurosis fugax.  She does continue to smoke and reports she is under a great deal of stress taking care of her 77 year old father.  Past Medical History:  Diagnosis Date  . Carotid artery occlusion   . DDD (degenerative disc disease)   . Depression   . DVT (deep venous thrombosis) ([place]) 2008   right leg  . Hypertension   . Thyroid disease     Family History  Problem Relation Age of Onset  . Heart disease Mother   . Hyperlipidemia Mother   . Heart attack Mother   . AAA (abdominal aortic aneurysm) Mother   . Cancer Father   . Heart disease Brother   . Hypertension Brother   . Hypertension Son     SOCIAL HISTORY: Social History   Tobacco Use  . Smoking status: Current Every Day Smoker    Packs/day: 1.00    Types: Cigarettes    Start date: 03/29/1970  . Smokeless tobacco: Never Used  . Tobacco comment: Less than 1 pk per day  Substance Use Topics  . Alcohol use: No    Alcohol/week: 0.0 oz    No Known Allergies  Current Outpatient Medications  Medication Sig Dispense Refill  . aspirin EC 81 MG tablet Take 81 mg by mouth daily.    . Aspirin-Acetaminophen-Caffeine (GOODY HEADACHE PO) Take 1 packet by mouth as needed (for pain).    Marland Kitchen buPROPion (WELLBUTRIN SR) 150 MG 12 hr tablet Take 1 tablet by mouth daily.    . butalbital-acetaminophen-caffeine (FIORICET, ESGIC) 50-325-40 MG tablet     . cholecalciferol (VITAMIN D) 1000 units tablet  Take 1,000 Units by mouth daily.    . diazepam (VALIUM) 10 MG tablet Take 10 mg by mouth at bedtime as needed for sleep.     Marland Kitchen HYDROcodone-acetaminophen (NORCO) 10-325 MG per tablet Take 1 tablet by mouth every 4 (four) hours as needed. Reported on 07/05/2015    . methocarbamol (ROBAXIN) 500 MG tablet Take 1 tablet (500 mg total) by mouth 4 (four) times daily. 28 tablet 0  . metoprolol tartrate (LOPRESSOR) 25 MG tablet Take 1 tablet (25 mg total) by mouth 2 (two) times daily. 180 tablet 3  . Omega-3 Fatty Acids (FISH OIL) 1000 MG CAPS Take 1 capsule by mouth daily.     Marland Kitchen oxyCODONE (OXY IR/ROXICODONE) 5 MG immediate release tablet 5 mg.     . pantoprazole (PROTONIX) 40 MG tablet Take 1 tablet (40 mg total) by mouth daily at 6 (six) AM. 30 tablet 1  . SYNTHROID 112 MCG tablet Take 112 mcg by mouth daily.     No current facility-administered medications for this visit.     REVIEW OF SYSTEMS:  [X]  denotes positive finding, [ ]  denotes negative finding Cardiac  Comments:  Chest pain or chest pressure: x   Shortness of breath upon exertion:    Short of breath when lying flat:    Irregular heart rhythm: x  Vascular    Pain in calf, thigh, or hip brought on by ambulation: x   Pain in feet at night that wakes you up from your sleep:     Blood clot in your veins:    Leg swelling:           PHYSICAL EXAM: Vitals:   08/27/17 1219 08/27/17 1221  BP: 138/81 120/85  Pulse: 64   Resp: 16   Temp: 98.8 F (37.1 C)   TempSrc: Oral   SpO2: 98%   Weight: 122 lb (55.3 kg)   Height: 5\' 4"  (1.626 m)     GENERAL: The patient is a well-nourished female, in no acute distress. The vital signs are documented above. CARDIOVASCULAR: Carotid arteries without bruits bilaterally.  2+ radial pulses bilaterally.  1-2+ posterior tibial pulses bilaterally. PULMONARY: There is good air exchange  MUSCULOSKELETAL: There are no major deformities or cyanosis. NEUROLOGIC: No focal weakness or paresthesias are  detected. SKIN: There are no ulcers or rashes noted. PSYCHIATRIC: The patient has a normal affect.  DATA:  Carotid duplex today shows some progression and elevated velocities in her left internal carotid.  She remains in the 60 to 79% stenosis range.  Right carotid has no change in his in the 40 to 59% stenosis range  MEDICAL ISSUES: Asymptomatic moderate to severe left carotid stenosis.  She is right-handed.  I again explained the symptoms of carotid disease and she knows to report immediately should this occur.  Also explained the critical need for absolute smoking cessation to reduce her risk for progression.  She understands and will continue to attempt this.  We will see her again in 6 months with repeat carotid duplex.      Rosetta Posner, MD FACS [facility] Specialists of [facility] Tel [phone] Pager [phone]

## 2017-09-09 ENCOUNTER — Other Ambulatory Visit: Payer: Self-pay

## 2017-09-09 DIAGNOSIS — I6523 Occlusion and stenosis of bilateral carotid arteries: Secondary | ICD-10-CM

## 2017-09-17 ENCOUNTER — Encounter: Payer: Self-pay | Admitting: Cardiology

## 2017-09-25 ENCOUNTER — Encounter: Payer: Self-pay | Admitting: Cardiovascular Disease

## 2017-09-30 DIAGNOSIS — N941 Unspecified dyspareunia: Secondary | ICD-10-CM | POA: Diagnosis not present

## 2017-09-30 DIAGNOSIS — N952 Postmenopausal atrophic vaginitis: Secondary | ICD-10-CM | POA: Diagnosis not present

## 2017-10-15 DIAGNOSIS — Z6821 Body mass index (BMI) 21.0-21.9, adult: Secondary | ICD-10-CM | POA: Diagnosis not present

## 2017-10-15 DIAGNOSIS — G43909 Migraine, unspecified, not intractable, without status migrainosus: Secondary | ICD-10-CM | POA: Diagnosis not present

## 2017-10-15 DIAGNOSIS — K219 Gastro-esophageal reflux disease without esophagitis: Secondary | ICD-10-CM | POA: Diagnosis not present

## 2017-10-15 DIAGNOSIS — M1991 Primary osteoarthritis, unspecified site: Secondary | ICD-10-CM | POA: Diagnosis not present

## 2017-10-15 DIAGNOSIS — E782 Mixed hyperlipidemia: Secondary | ICD-10-CM | POA: Diagnosis not present

## 2017-10-15 DIAGNOSIS — G894 Chronic pain syndrome: Secondary | ICD-10-CM | POA: Diagnosis not present

## 2017-12-30 DIAGNOSIS — N941 Unspecified dyspareunia: Secondary | ICD-10-CM | POA: Diagnosis not present

## 2017-12-30 DIAGNOSIS — N951 Menopausal and female climacteric states: Secondary | ICD-10-CM | POA: Diagnosis not present

## 2017-12-30 DIAGNOSIS — N952 Postmenopausal atrophic vaginitis: Secondary | ICD-10-CM | POA: Diagnosis not present

## 2018-01-10 DIAGNOSIS — I10 Essential (primary) hypertension: Secondary | ICD-10-CM | POA: Diagnosis not present

## 2018-01-10 DIAGNOSIS — E063 Autoimmune thyroiditis: Secondary | ICD-10-CM | POA: Diagnosis not present

## 2018-01-10 DIAGNOSIS — Z1389 Encounter for screening for other disorder: Secondary | ICD-10-CM | POA: Diagnosis not present

## 2018-01-10 DIAGNOSIS — Z6822 Body mass index (BMI) 22.0-22.9, adult: Secondary | ICD-10-CM | POA: Diagnosis not present

## 2018-01-10 DIAGNOSIS — M75102 Unspecified rotator cuff tear or rupture of left shoulder, not specified as traumatic: Secondary | ICD-10-CM | POA: Diagnosis not present

## 2018-01-10 DIAGNOSIS — G894 Chronic pain syndrome: Secondary | ICD-10-CM | POA: Diagnosis not present

## 2018-01-10 DIAGNOSIS — J340 Abscess, furuncle and carbuncle of nose: Secondary | ICD-10-CM | POA: Diagnosis not present

## 2018-01-27 ENCOUNTER — Ambulatory Visit (INDEPENDENT_AMBULATORY_CARE_PROVIDER_SITE_OTHER): Payer: PPO | Admitting: Cardiology

## 2018-01-27 ENCOUNTER — Encounter: Payer: Self-pay | Admitting: Cardiology

## 2018-01-27 ENCOUNTER — Ambulatory Visit (INDEPENDENT_AMBULATORY_CARE_PROVIDER_SITE_OTHER): Payer: PPO

## 2018-01-27 ENCOUNTER — Encounter: Payer: Self-pay | Admitting: *Deleted

## 2018-01-27 VITALS — BP 164/72 | HR 82 | Ht 64.0 in | Wt 122.0 lb

## 2018-01-27 DIAGNOSIS — R002 Palpitations: Secondary | ICD-10-CM

## 2018-01-27 NOTE — Progress Notes (Signed)
Clinical Summary Ms. Primiano is a 63 y.o.female seen today for follow up of the following medical problems.    1. Prior history of chest pain -  admit [facility] 02/2015. Negative workup for ACS, echo showed LVEF 09-23%, grade I diastolic dysfunction, no WMAs - she was referred for outpatient stress test. Nuclear stress 03/2015 showed no ischemia, LVEF 30-44%  - no recent symptoms    2. Carotid stenosis - followed by vascular  3. Hyperlipidemia - statin intolerance. She reports intolerance to zetia as well - she reports pcp has discussed repatha but she has been reluctant.  4. Palpitations - high stress at home caring for her elderly father - started about 2-3 weeks ago. Fluttering midchest, she listened to her heart with a stethoscope and sounded like irregular beats - can last several hours. +fatigue - 1 cup of coffee in AM, noncaffeinated. No EtoH - symptoms occur few times a week.   Past Medical History:  Diagnosis Date  . Carotid artery occlusion   . DDD (degenerative disc disease)   . Depression   . DVT (deep venous thrombosis) ([place]) 2008   right leg  . Hypertension   . Thyroid disease      No Known Allergies   Current Outpatient Medications  Medication Sig Dispense Refill  . aspirin EC 81 MG tablet Take 81 mg by mouth daily.    . Aspirin-Acetaminophen-Caffeine (GOODY HEADACHE PO) Take 1 packet by mouth as needed (for pain).    Marland Kitchen buPROPion (WELLBUTRIN SR) 150 MG 12 hr tablet Take 1 tablet by mouth daily.    . butalbital-acetaminophen-caffeine (FIORICET, ESGIC) 50-325-40 MG tablet     . cholecalciferol (VITAMIN D) 1000 units tablet Take 1,000 Units by mouth daily.    . diazepam (VALIUM) 10 MG tablet Take 10 mg by mouth at bedtime as needed for sleep.     Marland Kitchen HYDROcodone-acetaminophen (NORCO) 10-325 MG per tablet Take 1 tablet by mouth every 4 (four) hours as needed. Reported on 07/05/2015    . methocarbamol (ROBAXIN) 500 MG tablet Take 1 tablet (500 mg  total) by mouth 4 (four) times daily. 28 tablet 0  . metoprolol tartrate (LOPRESSOR) 25 MG tablet Take 1 tablet (25 mg total) by mouth 2 (two) times daily. 180 tablet 3  . Omega-3 Fatty Acids (FISH OIL) 1000 MG CAPS Take 1 capsule by mouth daily.     Marland Kitchen oxyCODONE (OXY IR/ROXICODONE) 5 MG immediate release tablet 5 mg.     . pantoprazole (PROTONIX) 40 MG tablet Take 1 tablet (40 mg total) by mouth daily at 6 (six) AM. 30 tablet 1  . SYNTHROID 112 MCG tablet Take 112 mcg by mouth daily.     No current facility-administered medications for this visit.      Past Surgical History:  Procedure Laterality Date  . CESAREAN SECTION    . DILATION AND CURETTAGE OF UTERUS    . SHOULDER ARTHROSCOPY Left 2005     No Known Allergies    Family History  Problem Relation Age of Onset  . Heart disease Mother   . Hyperlipidemia Mother   . Heart attack Mother   . AAA (abdominal aortic aneurysm) Mother   . Cancer Father   . Heart disease Brother   . Hypertension Brother   . Hypertension Son      Social History Ms. Presas reports that she has been smoking cigarettes. She started smoking about 47 years ago. She has been smoking about 1.00 pack  per day. She has never used smokeless tobacco. Ms. Dilley reports no history of alcohol use.   Review of Systems CONSTITUTIONAL: No weight loss, fever, chills, weakness or fatigue.  HEENT: Eyes: No visual loss, blurred vision, double vision or yellow sclerae.No hearing loss, sneezing, congestion, runny nose or sore throat.  SKIN: No rash or itching.  CARDIOVASCULAR: per hpi RESPIRATORY: No shortness of breath, cough or sputum.  GASTROINTESTINAL: No anorexia, nausea, vomiting or diarrhea. No abdominal pain or blood.  GENITOURINARY: No burning on urination, no polyuria NEUROLOGICAL: No headache, dizziness, syncope, paralysis, ataxia, numbness or tingling in the extremities. No change in bowel or bladder control.  MUSCULOSKELETAL: No muscle, back pain,  joint pain or stiffness.  LYMPHATICS: No enlarged nodes. No history of splenectomy.  PSYCHIATRIC: No history of depression or anxiety.  ENDOCRINOLOGIC: No reports of sweating, cold or heat intolerance. No polyuria or polydipsia.  Marland Kitchen   Physical Examination Vitals:   01/27/18 1321  BP: (!) 164/72  Pulse: 82  SpO2: 97%   Vitals:   01/27/18 1321  Weight: 122 lb (55.3 kg)  Height: 5\' 4"  (1.626 m)    Gen: resting comfortably, no acute distress HEENT: no scleral icterus, pupils equal round and reactive, no palptable cervical adenopathy,  CV: RRR, no m/rg, no jvd Resp: Clear to auscultation bilaterally GI: abdomen is soft, non-tender, non-distended, normal bowel sounds, no hepatosplenomegaly MSK: extremities are warm, no edema.  Skin: warm, no rash Neuro:  no focal deficits Psych: appropriate affect   Diagnostic Studies 02/2015 echo Study Conclusions  - Left ventricle: The cavity size was normal. Wall thickness was increased in a pattern of mild LVH. Systolic function was normal. The estimated ejection fraction was in the range of 60% to 65%. Wall motion was normal; there were no regional wall motion abnormalities. Doppler parameters are consistent with abnormal left ventricular relaxation (grade 1 diastolic dysfunction). - Aortic valve: Mildly to moderately calcified annulus. Mildly thickened leaflets. - Mitral valve: Calcified annulus. Normal thickness leaflets . - Tricuspid valve: There was mild regurgitation.  03/2015 Nuclear stress test  There was no ST segment deviation noted during stress.  The study is normal. There are no perfusion defects consistent with prior infarct or current ischemia.  This is an intermediate risk study. Intermediate risk based on low calculated LVEF. Visually the LVEF looks normal, consider correlation with echo. There is no myocardium at jeopardy.  The left ventricular ejection fraction is moderately decreased  (30-44%).    Assessment and Plan  1. Palpitations - symptoms suggestive of symptomatic arrhythmia, plan for 2 week event monitor to further evaluate - EKG today shows NSR   F/u pending monitor results. Request labs from pcp      [name], M.D.

## 2018-01-27 NOTE — Patient Instructions (Signed)
Medication Instructions:  Your physician recommends that you continue on your current medications as directed. Please refer to the Current Medication list given to you today.  If you need a refill on your cardiac medications before your next appointment, please call your pharmacy.   Lab work: NONE  If you have labs (blood work) drawn today and your tests are completely normal, you will receive your results only by: Marland Kitchen MyChart Message (if you have MyChart) OR . A paper copy in the mail If you have any lab test that is abnormal or we need to change your treatment, we will call you to review the results.  Testing/Procedures: Your physician has recommended that you wear an event monitor. Event monitors are medical devices that record the heart's electrical activity. Doctors most often [place] these monitors to diagnose arrhythmias. Arrhythmias are problems with the speed or rhythm of the heartbeat. The monitor is a small, portable device. You can wear one while you do your normal daily activities. This is usually used to diagnose what is causing palpitations/syncope (passing out).    Follow-Up: At [facility], you and your health needs are our priority.  As part of our continuing mission to provide you with exceptional heart care, we have created designated Provider Care Teams.  These Care Teams include your primary Cardiologist (physician) and Advanced Practice Providers (APPs -  Physician Assistants and Nurse Practitioners) who all work together to provide you with the care you need, when you need it. You will need a follow up appointment pending test results .  Please call our office 2 months in advance to schedule this appointment.  You may see Carlyle Dolly, MD or one of the following Advanced Practice Providers on your designated Care Team:   Bernerd Pho, PA-C [facility]) . Ermalinda Barrios, PA-C ([place])  Any Other Special Instructions Will Be Listed Below (If  Applicable). Thank you for choosing [facility]!

## 2018-01-28 ENCOUNTER — Encounter: Payer: Self-pay | Admitting: Cardiology

## 2018-02-10 DIAGNOSIS — R002 Palpitations: Secondary | ICD-10-CM | POA: Diagnosis not present

## 2018-02-25 ENCOUNTER — Ambulatory Visit: Payer: PPO | Admitting: Vascular Surgery

## 2018-02-25 ENCOUNTER — Other Ambulatory Visit: Payer: Self-pay

## 2018-02-25 ENCOUNTER — Encounter: Payer: Self-pay | Admitting: Vascular Surgery

## 2018-02-25 ENCOUNTER — Ambulatory Visit (HOSPITAL_COMMUNITY)
Admission: RE | Admit: 2018-02-25 | Discharge: 2018-02-25 | Disposition: A | Discharge status: Home / Self Care | Payer: PPO | Source: Ambulatory Visit | Attending: Vascular Surgery | Admitting: Vascular Surgery

## 2018-02-25 VITALS — BP 173/88 | HR 76 | Temp 97.7°F | Resp 16 | Ht 64.0 in | Wt 122.0 lb

## 2018-02-25 DIAGNOSIS — I6523 Occlusion and stenosis of bilateral carotid arteries: Secondary | ICD-10-CM | POA: Insufficient documentation

## 2018-02-25 NOTE — Progress Notes (Signed)
[facility] [place]  Patient name: Samantha Osborne MRN: [id number] DOB: 06-Feb-1955 Sex: female  REASON FOR VISIT: Follow-up carotid stenosis  HPI: Samantha Osborne is a 63 y.o. female today for follow-up.  She is here with her husband.  She reports no focal deficits.  She does have increasing headache has had difficulty with sinus headaches in the past.  She denies any visual changes, a aphasia or focal neurologic deficits.  No new cardiac disease.  Does continue to smoke cigarettes  Past Medical History:  Diagnosis Date  . Carotid artery occlusion   . DDD (degenerative disc disease)   . Depression   . DVT (deep venous thrombosis) ([place]) 2008   right leg  . Hypertension   . Thyroid disease     Family History  Problem Relation Age of Onset  . Heart disease Mother   . Hyperlipidemia Mother   . Heart attack Mother   . AAA (abdominal aortic aneurysm) Mother   . Cancer Father   . Heart disease Brother   . Hypertension Brother   . Hypertension Son     SOCIAL HISTORY: Social History   Tobacco Use  . Smoking status: Current Every Day Smoker    Packs/day: 1.00    Types: Cigarettes    Start date: 03/29/1970  . Smokeless tobacco: Never Used  . Tobacco comment: Less than 1 pk per day  Substance Use Topics  . Alcohol use: No    Alcohol/week: 0.0 standard drinks    No Known Allergies  Current Outpatient Medications  Medication Sig Dispense Refill  . amLODipine (NORVASC) 5 MG tablet Take 5 mg by mouth daily.    Marland Kitchen aspirin EC 81 MG tablet Take 81 mg by mouth daily.    . Aspirin-Acetaminophen-Caffeine (GOODY HEADACHE PO) Take 1 packet by mouth as needed (for pain).    Marland Kitchen buPROPion (WELLBUTRIN SR) 150 MG 12 hr tablet Take 1 tablet by mouth daily.    . butalbital-acetaminophen-caffeine (FIORICET, ESGIC) 50-325-40 MG tablet     . cholecalciferol (VITAMIN D) 1000 units tablet Take 1,000 Units by mouth daily.    . diazepam (VALIUM)  10 MG tablet Take 10 mg by mouth at bedtime as needed for sleep.     Marland Kitchen HYDROcodone-acetaminophen (NORCO) 10-325 MG per tablet Take 1 tablet by mouth every 4 (four) hours as needed. Reported on 07/05/2015    . methocarbamol (ROBAXIN) 500 MG tablet Take 1 tablet (500 mg total) by mouth 4 (four) times daily. 28 tablet 0  . metoprolol tartrate (LOPRESSOR) 25 MG tablet Take 1 tablet (25 mg total) by mouth 2 (two) times daily. 180 tablet 3  . Omega-3 Fatty Acids (FISH OIL) 1000 MG CAPS Take 1 capsule by mouth daily.     Marland Kitchen oxyCODONE (OXY IR/ROXICODONE) 5 MG immediate release tablet 15 mg.     . pantoprazole (PROTONIX) 40 MG tablet Take 1 tablet (40 mg total) by mouth daily at 6 (six) AM. 30 tablet 1  . SYNTHROID 112 MCG tablet Take 112 mcg by mouth daily.     No current facility-administered medications for this visit.     REVIEW OF SYSTEMS:  [X]  denotes positive finding, [ ]  denotes negative finding Cardiac  Comments:  Chest pain or chest pressure: x   Shortness of breath upon exertion:    Short of breath when lying flat: x   Irregular heart rhythm: x       Vascular    Pain in  calf, thigh, or hip brought on by ambulation:    Pain in feet at night that wakes you up from your sleep:     Blood clot in your veins:    Leg swelling:           PHYSICAL EXAM: Vitals:   02/25/18 1040 02/25/18 1052  BP: (!) 152/73 (!) 173/88  Pulse: 76 76  Resp: 16   Temp: 97.7 F (36.5 C)   TempSrc: Oral   SpO2: 96%   Weight: 122 lb (55.3 kg)   Height: 5\' 4"  (1.626 m)     GENERAL: The patient is a well-nourished female, in no acute distress. The vital signs are documented above. CARDIOVASCULAR: I do not hear carotid bruits bilaterally.  2+ radial pulses. PULMONARY: There is good air exchange  MUSCULOSKELETAL: There are no major deformities or cyanosis. NEUROLOGIC: No focal weakness or paresthesias are detected. SKIN: There are no ulcers or rashes noted. PSYCHIATRIC: The patient has a normal  affect.  DATA:  Carotid duplex today reveals no change in her prior study.  She has a 40 to 59% right carotid stenosis and 60 to 79% left carotid stenosis  MEDICAL ISSUES: Carlyle Lipa asymptomatic disease.  Again reviewed symptoms of carotid disease.  We will see her again in 6 months with repeat carotid duplex and she will notify should she develop any symptoms    Rosetta Posner, MD [facility] Tel [phone] Pager [phone]

## 2018-03-03 DIAGNOSIS — Z6822 Body mass index (BMI) 22.0-22.9, adult: Secondary | ICD-10-CM | POA: Diagnosis not present

## 2018-03-03 DIAGNOSIS — L209 Atopic dermatitis, unspecified: Secondary | ICD-10-CM | POA: Diagnosis not present

## 2018-03-03 DIAGNOSIS — E748 Other specified disorders of carbohydrate metabolism: Secondary | ICD-10-CM | POA: Diagnosis not present

## 2018-03-03 DIAGNOSIS — E2740 Unspecified adrenocortical insufficiency: Secondary | ICD-10-CM | POA: Diagnosis not present

## 2018-03-03 DIAGNOSIS — I10 Essential (primary) hypertension: Secondary | ICD-10-CM | POA: Diagnosis not present

## 2018-03-03 DIAGNOSIS — L03119 Cellulitis of unspecified part of limb: Secondary | ICD-10-CM | POA: Diagnosis not present

## 2018-03-03 DIAGNOSIS — G894 Chronic pain syndrome: Secondary | ICD-10-CM | POA: Diagnosis not present

## 2018-03-03 DIAGNOSIS — R51 Headache: Secondary | ICD-10-CM | POA: Diagnosis not present

## 2018-03-03 DIAGNOSIS — M1991 Primary osteoarthritis, unspecified site: Secondary | ICD-10-CM | POA: Diagnosis not present

## 2018-03-05 ENCOUNTER — Telehealth: Payer: Self-pay | Admitting: Cardiology

## 2018-03-05 NOTE — Telephone Encounter (Signed)
Pt called wanting results from her monitor

## 2018-03-05 NOTE — Telephone Encounter (Signed)
Uploaded holter to Epic.Will let Dr. Harl Bowie know it is ready to be read.

## 2018-03-07 ENCOUNTER — Telehealth: Payer: Self-pay | Admitting: *Deleted

## 2018-03-07 DIAGNOSIS — R002 Palpitations: Secondary | ICD-10-CM

## 2018-03-07 NOTE — Telephone Encounter (Signed)
-----   Message from [name], MD sent at 03/07/2018  2:39 PM EST ----- Heart monitor overall looks good, just some occasoinal extra heart beats, at time she can have a few in a row. I would like to get an echo to evaluate for any strucutural heart disease that may be contributing to some of these extra beats. If ongoing symptoms of palpitations can increase lopressor to 37.5mg  bid  J BrancH MD

## 2018-03-07 NOTE — Telephone Encounter (Signed)
Called patient with test results. No answer. Left message to call back.  

## 2018-03-17 ENCOUNTER — Ambulatory Visit (HOSPITAL_COMMUNITY)
Admission: RE | Admit: 2018-03-17 | Discharge: 2018-03-17 | Disposition: A | Discharge status: Home / Self Care | Payer: PPO | Source: Ambulatory Visit | Attending: Cardiology | Admitting: Cardiology

## 2018-03-17 DIAGNOSIS — R002 Palpitations: Secondary | ICD-10-CM | POA: Insufficient documentation

## 2018-03-17 NOTE — Progress Notes (Signed)
*  PRELIMINARY RESULTS* Echocardiogram 2D Echocardiogram has been performed.  Samuel Germany 03/17/2018, 11:11 AM

## 2018-04-02 DIAGNOSIS — L308 Other specified dermatitis: Secondary | ICD-10-CM | POA: Diagnosis not present

## 2018-04-10 DIAGNOSIS — G894 Chronic pain syndrome: Secondary | ICD-10-CM | POA: Diagnosis not present

## 2018-04-10 DIAGNOSIS — N952 Postmenopausal atrophic vaginitis: Secondary | ICD-10-CM | POA: Diagnosis not present

## 2018-04-10 DIAGNOSIS — Z6822 Body mass index (BMI) 22.0-22.9, adult: Secondary | ICD-10-CM | POA: Diagnosis not present

## 2018-05-01 DIAGNOSIS — I10 Essential (primary) hypertension: Secondary | ICD-10-CM | POA: Diagnosis not present

## 2018-05-01 DIAGNOSIS — Z1389 Encounter for screening for other disorder: Secondary | ICD-10-CM | POA: Diagnosis not present

## 2018-05-01 DIAGNOSIS — E785 Hyperlipidemia, unspecified: Secondary | ICD-10-CM | POA: Diagnosis not present

## 2018-05-01 DIAGNOSIS — K219 Gastro-esophageal reflux disease without esophagitis: Secondary | ICD-10-CM | POA: Diagnosis not present

## 2018-05-01 DIAGNOSIS — I712 Thoracic aortic aneurysm, without rupture: Secondary | ICD-10-CM | POA: Diagnosis not present

## 2018-05-01 DIAGNOSIS — Z0001 Encounter for general adult medical examination with abnormal findings: Secondary | ICD-10-CM | POA: Diagnosis not present

## 2018-05-01 DIAGNOSIS — Z681 Body mass index (BMI) 19 or less, adult: Secondary | ICD-10-CM | POA: Diagnosis not present

## 2018-05-01 DIAGNOSIS — L570 Actinic keratosis: Secondary | ICD-10-CM | POA: Diagnosis not present

## 2018-05-01 DIAGNOSIS — L648 Other androgenic alopecia: Secondary | ICD-10-CM | POA: Diagnosis not present

## 2018-05-01 DIAGNOSIS — I4891 Unspecified atrial fibrillation: Secondary | ICD-10-CM | POA: Diagnosis not present

## 2018-05-01 DIAGNOSIS — H16223 Keratoconjunctivitis sicca, not specified as Sjogren's, bilateral: Secondary | ICD-10-CM | POA: Diagnosis not present

## 2018-05-01 DIAGNOSIS — H25813 Combined forms of age-related cataract, bilateral: Secondary | ICD-10-CM | POA: Diagnosis not present

## 2018-05-01 DIAGNOSIS — Z23 Encounter for immunization: Secondary | ICD-10-CM | POA: Diagnosis not present

## 2018-05-01 DIAGNOSIS — I5022 Chronic systolic (congestive) heart failure: Secondary | ICD-10-CM | POA: Diagnosis not present

## 2018-05-01 DIAGNOSIS — L821 Other seborrheic keratosis: Secondary | ICD-10-CM | POA: Diagnosis not present

## 2018-05-01 DIAGNOSIS — M25511 Pain in right shoulder: Secondary | ICD-10-CM | POA: Diagnosis not present

## 2018-05-01 DIAGNOSIS — I714 Abdominal aortic aneurysm, without rupture: Secondary | ICD-10-CM | POA: Diagnosis not present

## 2018-05-01 DIAGNOSIS — F330 Major depressive disorder, recurrent, mild: Secondary | ICD-10-CM | POA: Diagnosis not present

## 2018-05-01 DIAGNOSIS — G894 Chronic pain syndrome: Secondary | ICD-10-CM | POA: Diagnosis not present

## 2018-05-01 DIAGNOSIS — R7309 Other abnormal glucose: Secondary | ICD-10-CM | POA: Diagnosis not present

## 2018-05-01 DIAGNOSIS — I8393 Asymptomatic varicose veins of bilateral lower extremities: Secondary | ICD-10-CM | POA: Diagnosis not present

## 2018-05-01 DIAGNOSIS — E063 Autoimmune thyroiditis: Secondary | ICD-10-CM | POA: Diagnosis not present

## 2018-05-01 DIAGNOSIS — D225 Melanocytic nevi of trunk: Secondary | ICD-10-CM | POA: Diagnosis not present

## 2018-05-01 DIAGNOSIS — Z0000 Encounter for general adult medical examination without abnormal findings: Secondary | ICD-10-CM | POA: Diagnosis not present

## 2018-05-01 DIAGNOSIS — R29810 Facial weakness: Secondary | ICD-10-CM | POA: Diagnosis not present

## 2018-05-01 DIAGNOSIS — D1801 Hemangioma of skin and subcutaneous tissue: Secondary | ICD-10-CM | POA: Diagnosis not present

## 2018-05-01 DIAGNOSIS — D485 Neoplasm of uncertain behavior of skin: Secondary | ICD-10-CM | POA: Diagnosis not present

## 2018-05-15 ENCOUNTER — Ambulatory Visit: Payer: PPO | Admitting: Cardiology

## 2018-06-09 DIAGNOSIS — R61 Generalized hyperhidrosis: Secondary | ICD-10-CM | POA: Diagnosis not present

## 2018-06-09 DIAGNOSIS — M549 Dorsalgia, unspecified: Secondary | ICD-10-CM | POA: Diagnosis not present

## 2018-06-09 DIAGNOSIS — M47814 Spondylosis without myelopathy or radiculopathy, thoracic region: Secondary | ICD-10-CM | POA: Diagnosis not present

## 2018-06-09 DIAGNOSIS — Z6822 Body mass index (BMI) 22.0-22.9, adult: Secondary | ICD-10-CM | POA: Diagnosis not present

## 2018-06-09 DIAGNOSIS — E559 Vitamin D deficiency, unspecified: Secondary | ICD-10-CM | POA: Diagnosis not present

## 2018-06-09 DIAGNOSIS — Z79891 Long term (current) use of opiate analgesic: Secondary | ICD-10-CM | POA: Diagnosis not present

## 2018-06-09 DIAGNOSIS — E748 Other specified disorders of carbohydrate metabolism: Secondary | ICD-10-CM | POA: Diagnosis not present

## 2018-06-09 DIAGNOSIS — K219 Gastro-esophageal reflux disease without esophagitis: Secondary | ICD-10-CM | POA: Diagnosis not present

## 2018-06-09 DIAGNOSIS — G894 Chronic pain syndrome: Secondary | ICD-10-CM | POA: Diagnosis not present

## 2018-06-09 DIAGNOSIS — Z1389 Encounter for screening for other disorder: Secondary | ICD-10-CM | POA: Diagnosis not present

## 2018-06-09 DIAGNOSIS — I10 Essential (primary) hypertension: Secondary | ICD-10-CM | POA: Diagnosis not present

## 2018-06-09 DIAGNOSIS — R079 Chest pain, unspecified: Secondary | ICD-10-CM | POA: Diagnosis not present

## 2018-06-09 DIAGNOSIS — M1991 Primary osteoarthritis, unspecified site: Secondary | ICD-10-CM | POA: Diagnosis not present

## 2018-06-17 ENCOUNTER — Other Ambulatory Visit: Payer: Self-pay

## 2018-06-17 ENCOUNTER — Encounter (HOSPITAL_COMMUNITY): Payer: Self-pay | Admitting: Emergency Medicine

## 2018-06-17 ENCOUNTER — Emergency Department (HOSPITAL_COMMUNITY)
Admission: EM | Admit: 2018-06-17 | Discharge: 2018-06-17 | Disposition: A | Discharge status: Home / Self Care | Payer: PPO | Attending: Emergency Medicine | Admitting: Emergency Medicine

## 2018-06-17 DIAGNOSIS — M5134 Other intervertebral disc degeneration, thoracic region: Secondary | ICD-10-CM | POA: Diagnosis not present

## 2018-06-17 DIAGNOSIS — Z79899 Other long term (current) drug therapy: Secondary | ICD-10-CM | POA: Insufficient documentation

## 2018-06-17 DIAGNOSIS — I10 Essential (primary) hypertension: Secondary | ICD-10-CM | POA: Diagnosis not present

## 2018-06-17 DIAGNOSIS — J45909 Unspecified asthma, uncomplicated: Secondary | ICD-10-CM | POA: Diagnosis not present

## 2018-06-17 DIAGNOSIS — M5124 Other intervertebral disc displacement, thoracic region: Secondary | ICD-10-CM | POA: Diagnosis not present

## 2018-06-17 DIAGNOSIS — M47814 Spondylosis without myelopathy or radiculopathy, thoracic region: Secondary | ICD-10-CM | POA: Diagnosis not present

## 2018-06-17 DIAGNOSIS — M79661 Pain in right lower leg: Secondary | ICD-10-CM | POA: Diagnosis not present

## 2018-06-17 MED ORDER — ENOXAPARIN SODIUM 60 MG/0.6ML ~~LOC~~ SOLN
1.0000 mg/kg | Freq: Once | SUBCUTANEOUS | Status: AC
Start: 2018-06-17 — End: 2018-06-17
  Administered 2018-06-17: 60 mg via SUBCUTANEOUS
  Filled 2018-06-17: qty 0.6

## 2018-06-17 NOTE — Discharge Instructions (Addendum)
As discussed, you have been given a blood thinner tonight.  Try to avoid falling or getting cut.  Call [phone] tomorrow morning to schedule a time to come in to have an ultrasound of your leg tomorrow.  You may take tylenol if needed for pain

## 2018-06-17 NOTE — ED Provider Notes (Signed)
[facility] EMERGENCY DEPARTMENT Provider Note   CSN: [id number] Arrival date & time: 06/17/18  2059    History   Chief Complaint Chief Complaint  Patient presents with  . ? DVT    HPI Samantha Osborne is a 63 y.o. female.     HPI   Samantha Osborne is a 63 y.o. female who presents to the Emergency Department complaining of sudden onset of right medial calf pain.  She states that she was going down several steps and felt a sharp pain to the inside of her calf.  She describes pain associated with movement of her right foot and with weight bearing.  Pain improves at rest. She also complains of some mild swelling of her calf.  She reports a hx of DVT to her right leg in 2008 and was anti-coagulated for some time afterwards. Her current pain feels similar to her previous DVT.   She denies fall, numbness of extremity, chest pain, recent travel, or shortness of breath.  She takes 81 mg ASA daily.  She is a current smoker.    Past Medical History:  Diagnosis Date  . Carotid artery occlusion   . DDD (degenerative disc disease)   . Depression   . DVT (deep venous thrombosis) ([place]) 2008   right leg  . Hypertension   . Thyroid disease     Patient Active Problem List   Diagnosis Date Noted  . Chest pain 03/15/2015  . Atypical chest pain 03/15/2015  . Hyperlipidemia 03/15/2015  . GERD (gastroesophageal reflux disease) 03/15/2015  . 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 Other specified hypothyroidism 12/03/2014  . Carotid stenosis 10/27/2013    Past Surgical History:  Procedure Laterality Date  . CESAREAN SECTION    . DILATION AND CURETTAGE OF UTERUS    . SHOULDER ARTHROSCOPY Left 2005     OB History   No obstetric history on file.      Home Medications    Prior to Admission medications   Medication Sig Start Date End Date Taking? Authorizing Provider  amLODipine (NORVASC) 5 MG tablet Take 5 mg by mouth daily.    [provider]  aspirin EC 81 MG  tablet Take 81 mg by mouth daily.    [provider]  Aspirin-Acetaminophen-Caffeine (GOODY HEADACHE PO) Take 1 packet by mouth as needed (for pain).    [provider]  buPROPion (WELLBUTRIN SR) 150 MG 12 hr tablet Take 1 tablet by mouth daily. 02/15/15   [provider]  butalbital-acetaminophen-caffeine (FIORICET, ESGIC) 93-235-57 MG tablet  03/22/15   [provider]  cholecalciferol (VITAMIN D) 1000 units tablet Take 1,000 Units by mouth daily.    [provider]  diazepam (VALIUM) 10 MG tablet Take 10 mg by mouth at bedtime as needed for sleep.  07/20/13   [provider]  HYDROcodone-acetaminophen (NORCO) 10-325 MG per tablet Take 1 tablet by mouth every 4 (four) hours as needed. Reported on 07/05/2015 09/07/13   [provider]  methocarbamol (ROBAXIN) 500 MG tablet Take 1 tablet (500 mg total) by mouth 4 (four) times daily. 09/11/13   [name], PA-C  metoprolol tartrate (LOPRESSOR) 25 MG tablet Take 1 tablet (25 mg total) by mouth 2 (two) times daily. 03/29/15   [name], MD  Omega-3 Fatty Acids (FISH OIL) 1000 MG CAPS Take 1 capsule by mouth daily.     [provider]  oxyCODONE (OXY IR/ROXICODONE) 5 MG immediate release tablet 15 mg.  06/15/15   [provider]  pantoprazole (PROTONIX) 40 MG tablet Take 1 tablet (40 mg total) by mouth daily at 6 (six) AM. 03/16/15   Kathie Dike, MD  SYNTHROID 112 MCG tablet Take 112 mcg by mouth daily. 08/11/13   [provider]    Family History Family History  Problem Relation Age of Onset  . Heart disease Mother   . Hyperlipidemia Mother   . Heart attack Mother   . AAA (abdominal aortic aneurysm) Mother   . Cancer Father   . Heart disease Brother   . Hypertension Brother   . Hypertension Son     Social History Social History   Tobacco Use  . Smoking status: Current Every Day Smoker    Packs/day: 1.00    Types: Cigarettes    Start date:  03/29/1970  . Smokeless tobacco: Never Used  . Tobacco comment: Less than 1 pk per day  Substance Use Topics  . Alcohol use: No    Alcohol/week: 0.0 standard drinks  . Drug use: No     Allergies   Patient has no known allergies.   Review of Systems Review of Systems  Constitutional: Negative for chills and fever.  Respiratory: Negative for cough, chest tightness and shortness of breath.   Cardiovascular: Negative for chest pain.  Genitourinary: Negative for flank pain.  Musculoskeletal: Positive for myalgias (pain and mild swelling of right lower leg). Negative for joint swelling.  Skin: Negative for color change and wound.  Neurological: Negative for weakness and numbness.  Psychiatric/Behavioral: Negative for confusion.     Physical Exam Updated Vital Signs BP (!) 151/80 (BP Location: Left Arm)   Pulse 91   Temp 98.2 F (36.8 C) (Oral)   Resp 18   Ht 5\' 4"  (1.626 m)   Wt 58.1 kg   SpO2 96%   BMI 21.97 kg/m   Physical Exam Vitals signs and nursing note reviewed.  Constitutional:      Appearance: Normal appearance.  HENT:     Head: Atraumatic.  Neck:     Musculoskeletal: Normal range of motion.  Cardiovascular:     Rate and Rhythm: Normal rate and regular rhythm.     Pulses: Normal pulses.  Pulmonary:     Effort: Pulmonary effort is normal.     Breath sounds: Normal breath sounds.  Chest:     Chest wall: No tenderness.  Musculoskeletal:        General: Tenderness and signs of injury present.     Comments: Focal ttp of the medial aspect of the right lower leg.  Achilles tendon appears intact and is NT.  Pt unable to perform [facility] test due to level of pain.  No significant edema, no erythema.    Skin:    General: Skin is warm.     Capillary Refill: Capillary refill takes less than 2 seconds.     Findings: No rash.  Neurological:     General: No focal deficit present.     Mental Status: She is alert.     Sensory: Sensation is intact. No sensory deficit.      Motor: Motor function is intact. No weakness.     Comments: CN II-XII grossly intact      ED Treatments / Results  Labs (all labs ordered are listed, but only abnormal results are displayed) Labs Reviewed - No data to display  EKG None  Radiology No results found.  Procedures Procedures (including critical care time)  Medications Ordered in  ED Medications  enoxaparin (LOVENOX) injection 60 mg (60 mg Subcutaneous Given 06/17/18 2143)     Initial Impression / Assessment and Plan / ED Course  I have reviewed the triage vital signs and the nursing notes.  Pertinent labs & imaging results that were available during my care of the patient were reviewed by me and considered in my medical decision making (see chart for details).        Pt with sudden onset of medial calf pain while stepping.  No fall. Clinically, I suspect this is probably musculoskeletal, but given hx of DVT along with some risk factors,  [place] study is warranted.  No technician available here after hours, so Pt will be given [place] Lovenox tonight and scheduled for venous imaging of her right leg tomorrow.  Pt verbalized understanding and agrees to plan.    Final Clinical Impressions(s) / ED Diagnoses   Final diagnoses:  Pain in right lower leg    ED Discharge Orders         Ordered    US Venous Img Lower Unilateral Right     06/17/18 2142           Kem Parkinson, PA-C 06/17/18 2201    Margette Fast, MD 06/18/18 [phone]

## 2018-06-17 NOTE — ED Triage Notes (Signed)
Pt thinks she has a DVT in R calf. Pt states she has had one before and that "this feels same way". Pt takes ASA daily. Pt states she was walking down 3 steps when she noticed a sharp, stabbing pain in R calf.

## 2018-06-18 ENCOUNTER — Ambulatory Visit (HOSPITAL_COMMUNITY)
Admission: RE | Admit: 2018-06-18 | Discharge: 2018-06-18 | Disposition: A | Discharge status: Home / Self Care | Payer: PPO | Source: Ambulatory Visit | Attending: Emergency Medicine | Admitting: Emergency Medicine

## 2018-06-18 DIAGNOSIS — M79661 Pain in right lower leg: Secondary | ICD-10-CM | POA: Diagnosis not present

## 2018-06-18 DIAGNOSIS — Z86718 Personal history of other venous thrombosis and embolism: Secondary | ICD-10-CM | POA: Diagnosis not present

## 2018-06-18 NOTE — ED Provider Notes (Signed)
Pt returned today for her [place].  Results:  IMPRESSION: No evidence of acute or chronic DVT within the right lower Extremity.  Pt's right calf is tender and swollen and seems to be more msk in nature.  She is given an ace wrap per her request and told to f/u with ortho.  Return if worse.   Isla Pence, MD 06/18/18 1006

## 2018-06-19 DIAGNOSIS — Z6822 Body mass index (BMI) 22.0-22.9, adult: Secondary | ICD-10-CM | POA: Diagnosis not present

## 2018-06-19 DIAGNOSIS — G4709 Other insomnia: Secondary | ICD-10-CM | POA: Diagnosis not present

## 2018-06-19 DIAGNOSIS — F419 Anxiety disorder, unspecified: Secondary | ICD-10-CM | POA: Diagnosis not present

## 2018-06-19 DIAGNOSIS — S86819A Strain of other muscle(s) and tendon(s) at lower leg level, unspecified leg, initial encounter: Secondary | ICD-10-CM | POA: Diagnosis not present

## 2018-06-25 DIAGNOSIS — J479 Bronchiectasis, uncomplicated: Secondary | ICD-10-CM | POA: Diagnosis not present

## 2018-06-25 DIAGNOSIS — I2510 Atherosclerotic heart disease of native coronary artery without angina pectoris: Secondary | ICD-10-CM | POA: Diagnosis not present

## 2018-06-25 DIAGNOSIS — R918 Other nonspecific abnormal finding of lung field: Secondary | ICD-10-CM | POA: Diagnosis not present

## 2018-06-25 DIAGNOSIS — R590 Localized enlarged lymph nodes: Secondary | ICD-10-CM | POA: Diagnosis not present

## 2018-07-03 DIAGNOSIS — J8410 Pulmonary fibrosis, unspecified: Secondary | ICD-10-CM | POA: Diagnosis not present

## 2018-07-03 DIAGNOSIS — E063 Autoimmune thyroiditis: Secondary | ICD-10-CM | POA: Diagnosis not present

## 2018-07-03 DIAGNOSIS — I10 Essential (primary) hypertension: Secondary | ICD-10-CM | POA: Diagnosis not present

## 2018-07-03 DIAGNOSIS — G894 Chronic pain syndrome: Secondary | ICD-10-CM | POA: Diagnosis not present

## 2018-07-03 DIAGNOSIS — J849 Interstitial pulmonary disease, unspecified: Secondary | ICD-10-CM | POA: Diagnosis not present

## 2018-07-03 DIAGNOSIS — Z6821 Body mass index (BMI) 21.0-21.9, adult: Secondary | ICD-10-CM | POA: Diagnosis not present

## 2018-07-16 IMAGING — MR MR LUMBAR SPINE W/O CM
4 of 5 series · 13 of 48 positions shown · non-contrast
Comparison: MRI 03/03/2003

CLINICAL DATA: Low back pain and bilateral leg numbness over the
last 20 years, worsening recently.

EXAM:
MRI LUMBAR SPINE WITHOUT CONTRAST
TECHNIQUE: Multiplanar, multisequence MR imaging of the lumbar spine was
performed. No intravenous contrast was administered.

[Series 3: T2 · sagittal · 4.0mm · 0.77mm/px · 4 of 13 slices shown (1 of 2)]
[im 1/13]
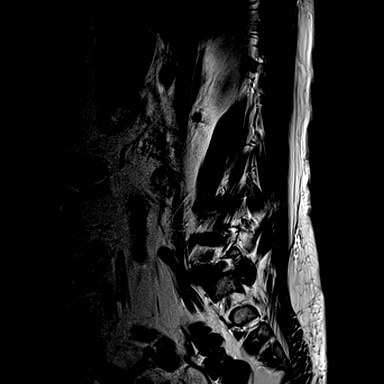
[im 5/13]
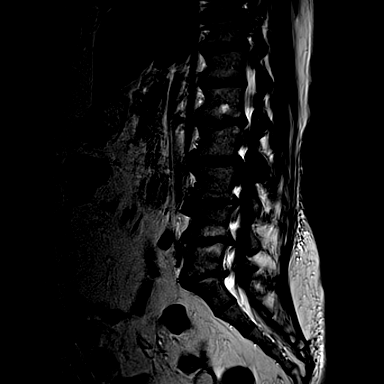
[im 9/13]
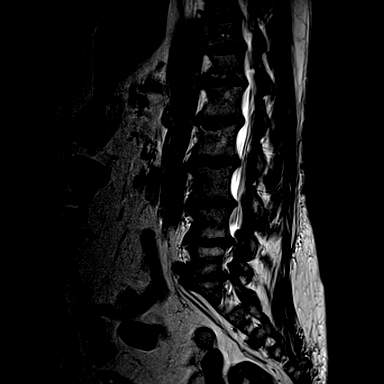
[im 13/13]
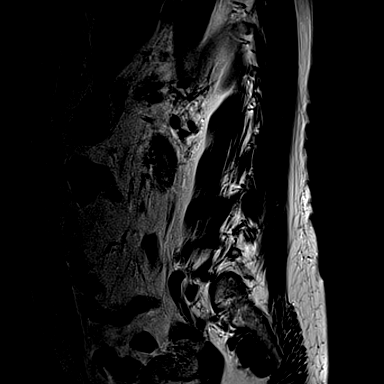

[Series 4: T1 · sagittal · 4.0mm · 0.38mm/px · 3 of 13 slices shown (1 of 2)]
[im 1/13]
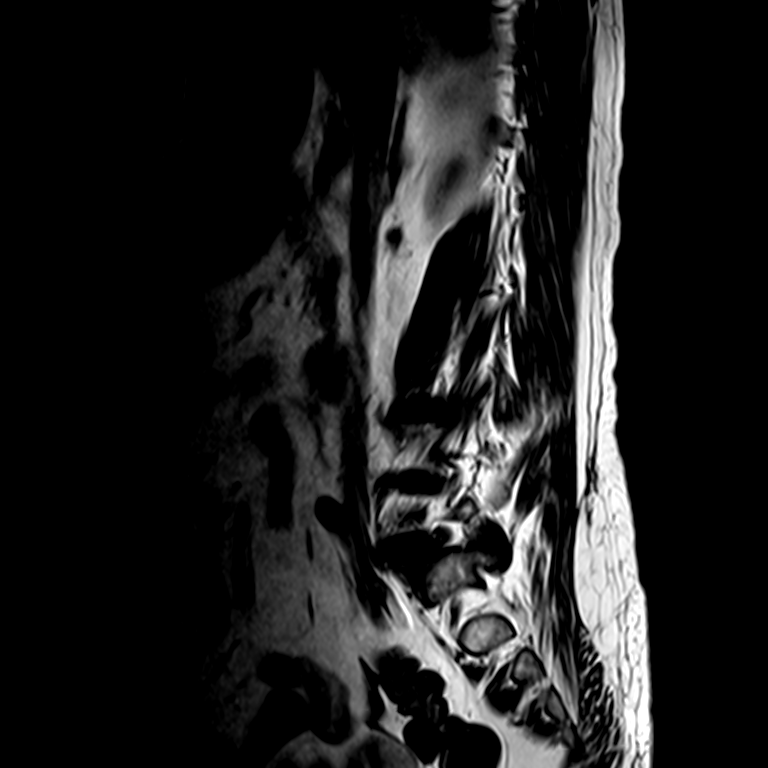
[im 7/13]
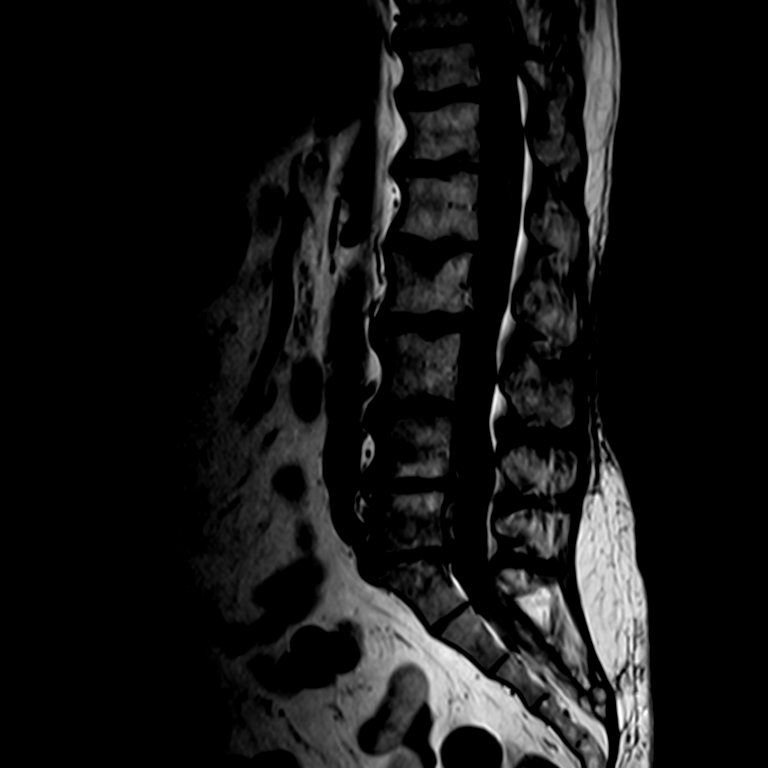
[im 13/13]
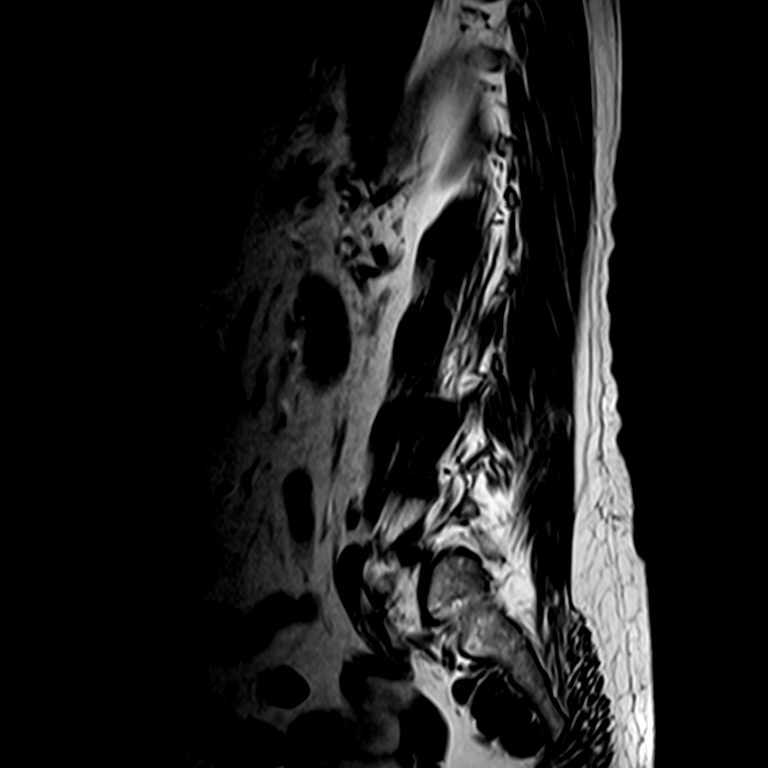

[Series 6: T2 · axial · 4.0mm · 0.24mm/px · z∈[-1,+178]mm · 3 of 47 slices shown (2 of 2)]
[im 6/47]
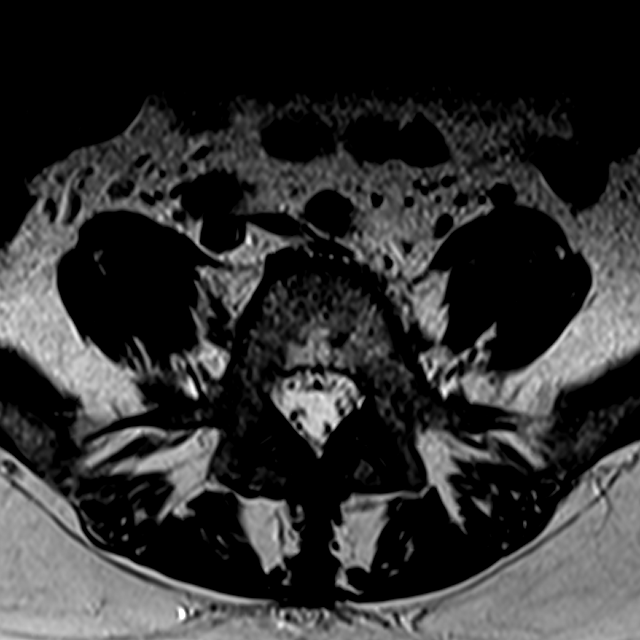
[im 24/47]
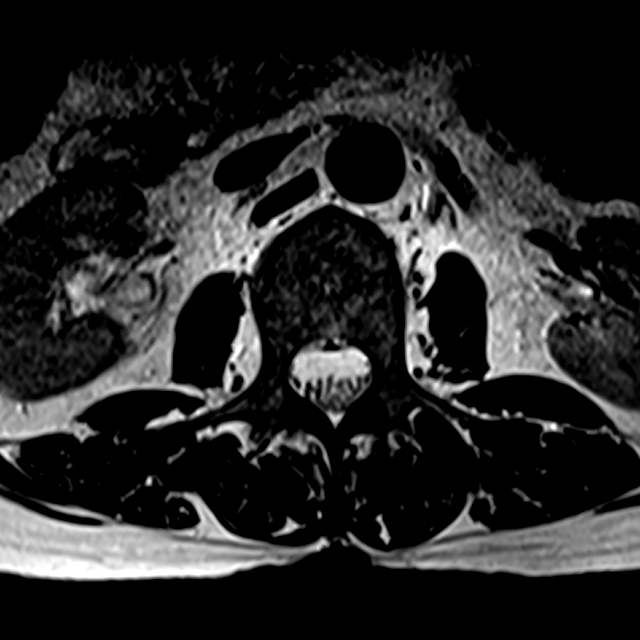
[im 41/47]
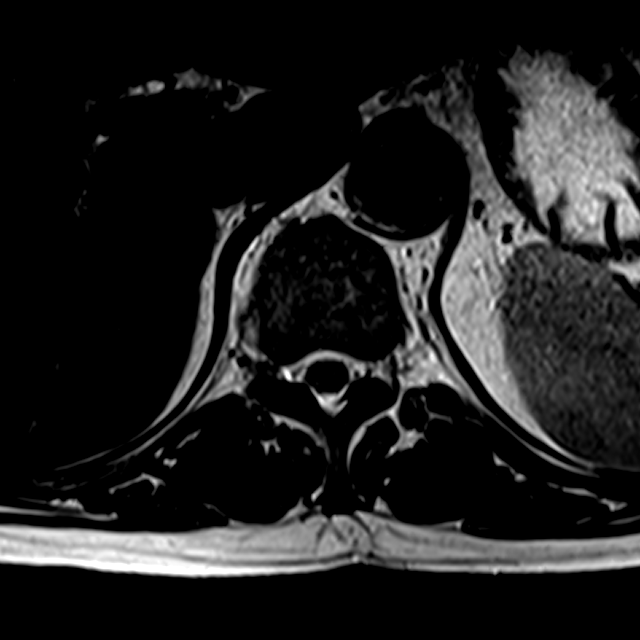

[Series 7: T1 · axial · 4.0mm · 0.24mm/px · z∈[-1,+178]mm · 3 of 47 slices shown (2 of 2)]
[im 6/47]
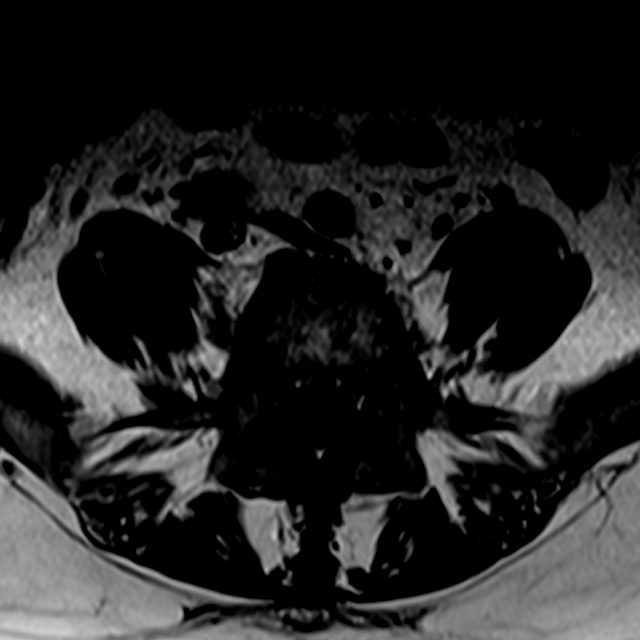
[im 24/47]
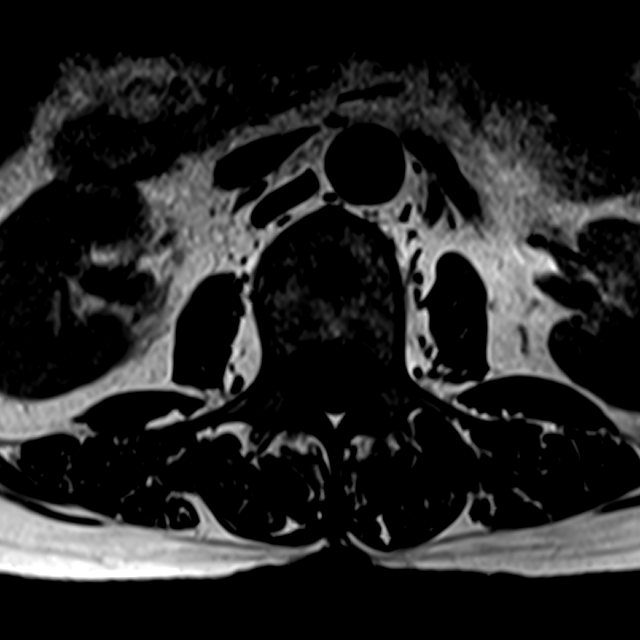
[im 41/47]
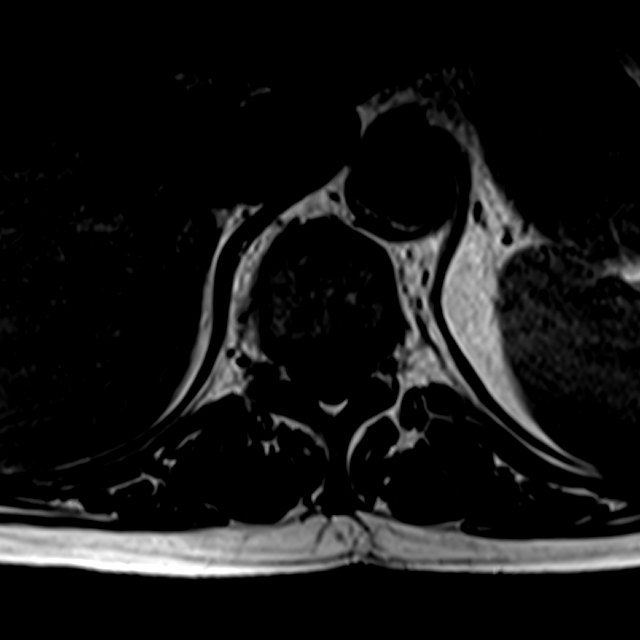

[13 of 48 positions shown; findings below may reference images not displayed]

FINDINGS: Segmentation:  5 lumbar type vertebral bodies.

Alignment:  Straightening of the normal lordosis.

Vertebrae: Superior endplate Schmorl's node or minimal fracture at
L2 with mild edema. Discogenic marrow changes at L4-5 and L5-S1 with
chronic and acute components.

Conus medullaris: Extends to the L1 level and appears normal.

Paraspinal and other soft tissues: No significant finding.

Disc levels:

Chronic disc degeneration from T10-11 through L1-2 with disc bulges
but no compressive narrowing of the canal or foramina. As noted
above, minimal superior endplate edema at L2 could relate to a
Schmorl's node or a minimal endplate fracture

L2-3: Broad-based, right posterior lateral prominent disc
herniation. Facet and ligamentous hypertrophy. Stenosis of the
lateral recesses right more than left. Neural compression could
occur at this level, particularly on the right.

L3-4: Shallow protrusion of the disc indents the thecal sac.
Stenosis of the lateral recesses right more than left. Neural
compression could occur on the right.

L4-5: Chronic disc degeneration with endplate osteophytes and
bulging of the disc mild stenosis of both lateral recesses and
neural foramina. Discogenic endplate marrow changes could be
associated with back pain.

L5-S1: Shallow disc herniation more prominent centrally and towards
the left. Mild facet hypertrophy. Narrowing of the subarticular
lateral recesses and foramina left more than right. Neural
compression could possibly occur at this level.

These findings have worsened in general since the study of 5444 as
expected.
IMPRESSION: Progressive degenerative disc disease since 5444. Endplate
osteophytes, disc bulges and disc protrusions at every level.
Minimal superior endplate edema at T12 could be a small Schmorl's
node or minimal endplate fracture. Chronic and active endplate
marrow changes at L4-5 and L5-S1 of present. All these findings
could certainly be associated with back pain.

From the standpoint of focal neural compression, at L2-3 there is a
broad-based right posterior lateral disc herniation which could
cause neural compression particularly on the right. At L3-4 disc
herniation causes narrowing of the lateral recesses right more than
left. Mild stenosis of the lateral recesses and foramina at L4-5.
Lateral recess and foraminal stenosis at L5-S1 left worse than
right.

## 2018-07-29 ENCOUNTER — Other Ambulatory Visit: Payer: Self-pay

## 2018-07-29 ENCOUNTER — Ambulatory Visit (INDEPENDENT_AMBULATORY_CARE_PROVIDER_SITE_OTHER): Payer: PPO | Admitting: Pulmonary Disease

## 2018-07-29 ENCOUNTER — Encounter: Payer: Self-pay | Admitting: Pulmonary Disease

## 2018-07-29 VITALS — BP 118/72 | HR 71 | Temp 98.5°F | Ht 61.5 in | Wt 123.4 lb

## 2018-07-29 DIAGNOSIS — J849 Interstitial pulmonary disease, unspecified: Secondary | ICD-10-CM

## 2018-07-29 NOTE — Progress Notes (Signed)
Samantha Osborne    [id number]    23-Jul-1955  Primary Care Physician:Fusco, Purcell Nails, MD  Referring Physician: [facility], [address],  [place] [address]  Chief complaint: Evaluation for interstitial lung disease  HPI: 63 year old with history of hypertension, rheumatoid arthritis, DVT Referred for evaluation of abnormal CT scan  CT scan done in June 2024 atypical chest pain.  Noted to have diffuse interstitial lung disease as noted below, atypical for UIP. Complains of chronic dyspnea exertion.  No cough, sputum production, fevers, chills Has small joint pain of the hands, morning stiffness, transient rash at the bottom of the legs.  Denies any difficulty swallowing.  Has history of obstructive sleep apnea diagnosed and 2017 but is noncompliant with CPAP.  Was diagnosed with rheumatoid arthritis in 2019 by Dr. Trudie Reed but has not followed up since or received any treatment for this.  Pets: Cat, no birds Occupation: Copywriter, advertising Exposures, interstitial lung disease questionarre  07/29/2018-Possible mold exposure at home.  No hot tubs, Jacuzzi.  She had received a dose of nitrofurantoin over 5 years ago Smoking history: 50-pack-year smoker.  Continues to smoke 1 pack/day Travel history: No significant problems history Relevant family history: No significant history of lung disease.  Sister has symptoms of rheumatoid arthritis as well.  Outpatient Encounter Medications as of 07/29/2018  Medication Sig  . amLODipine (NORVASC) 5 MG tablet Take 5 mg by mouth daily.  Marland Kitchen aspirin EC 81 MG tablet Take 81 mg by mouth daily.  . Aspirin-Acetaminophen-Caffeine (GOODY HEADACHE PO) Take 1 packet by mouth as needed (for pain).  Marland Kitchen buPROPion (WELLBUTRIN SR) 150 MG 12 hr tablet Take 1 tablet by mouth daily.  . butalbital-acetaminophen-caffeine (FIORICET, ESGIC) 50-325-40 MG tablet   . cholecalciferol (VITAMIN D) 1000 units tablet Take 1,000 Units by mouth daily.  Marland Kitchen  HYDROcodone-acetaminophen (NORCO) 10-325 MG per tablet Take 1 tablet by mouth every 4 (four) hours as needed. Reported on 07/05/2015  . levothyroxine (SYNTHROID) 25 MCG tablet Take 1 tablet by mouth daily.  . methocarbamol (ROBAXIN) 500 MG tablet Take 1 tablet (500 mg total) by mouth 4 (four) times daily.  . metoprolol tartrate (LOPRESSOR) 25 MG tablet Take 1 tablet (25 mg total) by mouth 2 (two) times daily.  . Omega-3 Fatty Acids (FISH OIL) 1000 MG CAPS Take 1 capsule by mouth daily.   Marland Kitchen oxyCODONE (OXY IR/ROXICODONE) 5 MG immediate release tablet 15 mg.   . SYNTHROID 112 MCG tablet Take 112 mcg by mouth daily.  . diazepam (VALIUM) 10 MG tablet Take 10 mg by mouth at bedtime as needed for sleep.   . pantoprazole (PROTONIX) 40 MG tablet Take 1 tablet (40 mg total) by mouth daily at 6 (six) AM. (Patient not taking: Reported on 07/29/2018)   No facility-administered encounter medications on file as of 07/29/2018.     Allergies as of 07/29/2018  . (No Known Allergies)    Past Medical History:  Diagnosis Date  . Carotid artery occlusion   . DDD (degenerative disc disease)   . Depression   . DVT (deep venous thrombosis) ([place]) 2008   right leg  . Hypertension   . Thyroid disease     Past Surgical History:  Procedure Laterality Date  . CESAREAN SECTION    . DILATION AND CURETTAGE OF UTERUS    . SHOULDER ARTHROSCOPY Left 2005    Family History  Problem Relation Age of Onset  . Heart disease Mother   .  Hyperlipidemia Mother   . Heart attack Mother   . AAA (abdominal aortic aneurysm) Mother   . Cancer Father   . Heart disease Brother   . Hypertension Brother   . Hypertension Son     Social History   Socioeconomic History  . Marital status: Married    Spouse name: Not on file  . Number of children: Not on file  . Years of education: Not on file  . Highest education level: Not on file  Occupational History  . Not on file  Social Needs  . Financial resource strain: Not on file   . Food insecurity    Worry: Not on file    Inability: Not on file  . Transportation needs    Medical: Not on file    Non-medical: Not on file  Tobacco Use  . Smoking status: Current Every Day Smoker    Packs/day: 1.00    Types: Cigarettes    Start date: 03/29/1970  . Smokeless tobacco: Never Used  . Tobacco comment: Less than 1 pk per day  Substance and Sexual Activity  . Alcohol use: No    Alcohol/week: 0.0 standard drinks  . Drug use: No  . Sexual activity: Yes  Lifestyle  . Physical activity    Days per week: Not on file    Minutes per session: Not on file  . Stress: Not on file  Relationships  . Social Herbalist on phone: Not on file    Gets together: Not on file    Attends religious service: Not on file    Active member of club or organization: Not on file    Attends meetings of clubs or organizations: Not on file    Relationship status: Not on file  . Intimate partner violence    Fear of current or ex partner: Not on file    Emotionally abused: Not on file    Physically abused: Not on file    Forced sexual activity: Not on file  Other Topics Concern  . Not on file  Social History Narrative  . Not on file    Review of systems: Review of Systems  Constitutional: Negative for fever and chills.  HENT: Negative.   Eyes: Negative for blurred vision.  Respiratory: as per HPI  Cardiovascular: Negative for chest pain and palpitations.  Gastrointestinal: Negative for vomiting, diarrhea, blood per rectum. Genitourinary: Negative for dysuria, urgency, frequency and hematuria.  Musculoskeletal: Negative for myalgias, back pain and joint pain.  Skin: Negative for itching and rash.  Neurological: Negative for dizziness, tremors, focal weakness, seizures and loss of consciousness.  Endo/Heme/Allergies: Negative for environmental allergies.  Psychiatric/Behavioral: Negative for depression, suicidal ideas and hallucinations.  All other systems reviewed and are  negative.  Physical Exam: Blood pressure 118/72, pulse 71, temperature 98.5 F (36.9 C), temperature source Oral, height 5' 1.5" (1.562 m), weight 123 lb 6.4 oz (56 kg), SpO2 98 %. Gen:      No acute distress HEENT:  EOMI, sclera anicteric Neck:     No masses; no thyromegaly Lungs:    Clear to auscultation bilaterally; normal respiratory effort CV:         Regular rate and rhythm; no murmurs Abd:      + bowel sounds; soft, non-tender; no palpable masses, no distension Ext:    No edema; adequate peripheral perfusion Skin:      Warm and dry; no rash Neuro: alert and oriented x 3 Psych: normal mood and  affect  Data Reviewed: Imaging: CT chest 06/25/2018- enlarged mediastinal lymph nodes.  Diffuse groundglass opacities, mild traction bronchiectasis without basilar gradient.  Indeterminate for UIP.  I have reviewed the images personally.  Assessment:  Consult for interstitial lung disease CT reviewed with nonspecific pattern of fibrosis, groundglass attenuation. 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  Will need evaluation for hypersensitivity pneumonitis as there may be mold exposure  Get serologies for CTD, hypersensitivity panel, angiotensin-converting enzyme, mold antibody panel Refer her back to Dr. Trudie Reed for reevaluation of rheumatoid arthritis Schedule pulmonary function test Regroup in 2 to 4 weeks to reevaluate and plan for next steps.  Plan/Recommendations: - Serologies for CTD, hypersensitivity pneumonitis, angiotensin-converting enzyme - Pulmonary function test - Refer to Dr. Trudie Reed, rheumatology  [name] MD [facility] 07/29/2018, 2:31 PM  CC: [facility], MD

## 2018-07-29 NOTE — Patient Instructions (Addendum)
We will get some labs today for interstitial lung disease, check hypersensitivity pneumonitis and mold antibody panel, angiotensin-converting enzyme We will schedule you for pulmonary function test We will refer you back to Dr. Gavin Pound for evaluation of rheumatoid arthritis  Follow-up in 1 month.

## 2018-07-30 ENCOUNTER — Other Ambulatory Visit: Payer: Self-pay | Admitting: Pulmonary Disease

## 2018-07-30 ENCOUNTER — Other Ambulatory Visit (HOSPITAL_COMMUNITY)
Admission: RE | Admit: 2018-07-30 | Discharge: 2018-07-30 | Disposition: A | Discharge status: Home / Self Care | Payer: PPO | Source: Ambulatory Visit | Attending: Pulmonary Disease | Admitting: Pulmonary Disease

## 2018-07-31 LAB — ANA,IFA RA DIAG PNL W/RFLX TIT/PATN
Anti Nuclear Antibody (ANA): POSITIVE — AB
Cyclic Citrullin Peptide Ab: <16 UNITS
Rhuematoid fact SerPl-aCnc: <14 IU/mL (ref <14)

## 2018-07-31 LAB — SJOGREN'S SYNDROME ANTIBODS(SSA + SSB)
SSA (Ro) (ENA) Antibody, IgG: <1 AI
SSB (La) (ENA) Antibody, IgG: <1 AI

## 2018-07-31 LAB — ANTI-NUCLEAR AB-TITER (ANA TITER): ANA Titer 1: 1:320 {titer} — ABNORMAL HIGH

## 2018-07-31 LAB — ANTI-SCLERODERMA ANTIBODY: Scleroderma (Scl-70) (ENA) Antibody, IgG: <1 AI

## 2018-07-31 LAB — ANCA SCREEN W REFLEX TITER

## 2018-08-01 ENCOUNTER — Other Ambulatory Visit: Payer: Self-pay

## 2018-08-01 ENCOUNTER — Other Ambulatory Visit (HOSPITAL_COMMUNITY)
Admission: RE | Admit: 2018-08-01 | Discharge: 2018-08-01 | Disposition: A | Discharge status: Home / Self Care | Payer: PPO | Source: Ambulatory Visit | Attending: Pulmonary Disease | Admitting: Pulmonary Disease

## 2018-08-01 DIAGNOSIS — Z1159 Encounter for screening for other viral diseases: Secondary | ICD-10-CM | POA: Insufficient documentation

## 2018-08-01 DIAGNOSIS — Z01812 Encounter for preprocedural laboratory examination: Secondary | ICD-10-CM | POA: Diagnosis not present

## 2018-08-01 LAB — SARS CORONAVIRUS 2 (TAT 6-24 HRS): SARS Coronavirus 2: NEGATIVE

## 2018-08-04 ENCOUNTER — Ambulatory Visit (INDEPENDENT_AMBULATORY_CARE_PROVIDER_SITE_OTHER): Payer: PPO | Admitting: Pulmonary Disease

## 2018-08-04 ENCOUNTER — Other Ambulatory Visit: Payer: Self-pay

## 2018-08-04 DIAGNOSIS — J849 Interstitial pulmonary disease, unspecified: Secondary | ICD-10-CM | POA: Diagnosis not present

## 2018-08-04 LAB — HYPERSENSITIVITY PNEUMONITIS
A. Pullulans Abs: NEGATIVE
A.Fumigatus #1 Abs: NEGATIVE
Micropolyspora faeni, IgG: NEGATIVE
Pigeon Serum Abs: NEGATIVE
Thermoact. Saccharii: NEGATIVE
Thermoactinomyces vulgaris, IgG: NEGATIVE

## 2018-08-04 LAB — PULMONARY FUNCTION TEST
DL/VA % pred: 82 %
DL/VA: 3.48 ml/min/mmHg/L
DLCO unc % pred: 62 %
DLCO unc: 11.51 ml/min/mmHg
FEF 25-75 Post: 4.05 L/sec
FEF 25-75 Pre: 3.2 L/sec
FEF2575-%Change-Post: 26 %
FEF2575-%Pred-Post: 200 %
FEF2575-%Pred-Pre: 158 %
FEV1-%Change-Post: 3 %
FEV1-%Pred-Post: 94 %
FEV1-%Pred-Pre: 90 %
FEV1-Post: 2.1 L
FEV1-Pre: 2.02 L
FEV1FVC-%Change-Post: 0 %
FEV1FVC-%Pred-Pre: 119 %
FEV6-%Change-Post: 4 %
FEV6-%Pred-Post: 81 %
FEV6-%Pred-Pre: 78 %
FEV6-Post: 2.3 L
FEV6-Pre: 2.19 L
FEV6FVC-%Pred-Post: 104 %
FEV6FVC-%Pred-Pre: 104 %
FVC-%Change-Post: 4 %
FVC-%Pred-Post: 78 %
FVC-%Pred-Pre: 75 %
FVC-Post: 2.3 L
FVC-Pre: 2.19 L
Post FEV1/FVC ratio: 91 %
Post FEV6/FVC ratio: 100 %
Pre FEV1/FVC ratio: 92 %
Pre FEV6/FVC Ratio: 100 %
RV % pred: 77 %
RV: 1.54 L
TLC % pred: 80 %
TLC: 3.83 L

## 2018-08-04 LAB — MYOSITIS PANEL III: RNP: 8.9 EU/ml

## 2018-08-04 NOTE — Progress Notes (Signed)
Full PFT performed today. °

## 2018-08-08 DIAGNOSIS — G894 Chronic pain syndrome: Secondary | ICD-10-CM | POA: Diagnosis not present

## 2018-08-08 DIAGNOSIS — J849 Interstitial pulmonary disease, unspecified: Secondary | ICD-10-CM | POA: Diagnosis not present

## 2018-08-08 DIAGNOSIS — E271 Primary adrenocortical insufficiency: Secondary | ICD-10-CM | POA: Diagnosis not present

## 2018-08-25 ENCOUNTER — Other Ambulatory Visit: Payer: Self-pay

## 2018-08-25 DIAGNOSIS — I6523 Occlusion and stenosis of bilateral carotid arteries: Secondary | ICD-10-CM

## 2018-08-26 ENCOUNTER — Telehealth: Payer: Self-pay | Admitting: *Deleted

## 2018-08-26 ENCOUNTER — Ambulatory Visit (HOSPITAL_COMMUNITY)
Admission: RE | Admit: 2018-08-26 | Discharge: 2018-08-26 | Disposition: A | Discharge status: Home / Self Care | Payer: PPO | Source: Ambulatory Visit | Attending: Family | Admitting: Family

## 2018-08-26 ENCOUNTER — Ambulatory Visit: Payer: PPO | Admitting: Family

## 2018-08-26 ENCOUNTER — Other Ambulatory Visit: Payer: Self-pay

## 2018-08-26 DIAGNOSIS — I6523 Occlusion and stenosis of bilateral carotid arteries: Secondary | ICD-10-CM | POA: Diagnosis not present

## 2018-08-26 NOTE — Telephone Encounter (Signed)
Virtual Visit Pre-Appointment Phone Call  Today, I spoke with Samantha Osborne and performed the following actions:  1. I explained that we are currently trying to limit exposure to the COVID-19 virus by seeing patients at home rather than in the office.  I explained that the visits are best done by video, but can be done by telephone.  I asked the patient if a virtual visit that the patient would like to try instead of coming into the office. Samantha Osborne agreed to proceed with the virtual visit scheduled with Samantha Osborne on 09/01/18.     2. I confirmed the BEST phone number to call the day of the visit and- I included this in appointment notes.  3. I asked if the patient had access to (through a family member/friend) a smartphone with video capability to be used for her visit?"  The patient said yes -    4. I confirmed consent by  a. sending through [facility] or by email the [facility] as written at the end of this message or  b. verbally as listed below. i. This visit is being performed in the setting of COVID-19. ii. All virtual visits are billed to your insurance company just like a normal visit would be.   iii. We'd like you to understand that the technology does not allow for your provider to perform an examination, and thus may limit your provider's ability to fully assess your condition.  iv. If your provider identifies any concerns that need to be evaluated in person, we will make arrangements to do so.   v. Finally, though the technology is pretty good, we cannot assure that it will always work on either your or our end, and in the setting of a video visit, we may have to convert it to a phone-only visit.  In either situation, we cannot ensure that we have a secure connection.   vi. Are you willing to proceed?"  STAFF: Did the patient verbally acknowledge consent to telehealth visit? Document YES/NO here: YES  2. I advised the patient to be  prepared - I asked that the patient, on the day of her visit, record any information possible with the equipment at her home, such as blood pressure, pulse, oxygen saturation, and your weight and write them all down. I asked the patient to have a pen and paper handy nearby the day of the visit as well.  3. If the patient was scheduled for a video visit, I informed the patient that the visit with the doctor would start with a text to the smartphone # given to [place] by the patient.         If the patient was scheduled for a telephone call, I informed the patient that the visit with the doctor would start with a call to the telephone # given to [place] by the patient.  4. I Informed patient they will receive a phone call 15 minutes prior to their appointment time from a [facility] or nurse to review medications, allergies, etc. to prepare for the visit.    TELEPHONE CALL NOTE  Samantha Osborne has been deemed a candidate for a follow-up tele-health visit to limit community exposure during the Covid-19 pandemic. I spoke with the patient via phone to ensure availability of phone/video source, confirm preferred email & phone number, and discuss instructions and expectations.  I reminded Samantha Osborne to be prepared with any vital sign and/or  heart rhythm information that could potentially be obtained via home monitoring, at the time of her visit. I reminded Samantha Osborne to expect a phone call prior to her visit.  [name], NT 08/26/2018 10:58 AM     FULL LENGTH CONSENT FOR TELE-HEALTH VISIT   I hereby voluntarily request, consent and authorize [facility] and its employed or contracted physicians, physician assistants, nurse practitioners or other licensed health care professionals (the Practitioner), to provide me with telemedicine health care services (the "Services") as deemed necessary by the treating Practitioner. I acknowledge and consent to receive the Services by the Practitioner via telemedicine. I  understand that the telemedicine visit will involve communicating with the Practitioner through live audiovisual communication technology and the disclosure of certain medical information by electronic transmission. I acknowledge that I have been given the opportunity to request an in-person assessment or other available alternative prior to the telemedicine visit and am voluntarily participating in the telemedicine visit.  I understand that I have the right to withhold or withdraw my consent to the use of telemedicine in the course of my care at any time, without affecting my right to future care or treatment, and that the Practitioner or I may terminate the telemedicine visit at any time. I understand that I have the right to inspect all information obtained and/or recorded in the course of the telemedicine visit and may receive copies of available information for a reasonable fee.  I understand that some of the potential risks of receiving the Services via telemedicine include:  Marland Kitchen Delay or interruption in medical evaluation due to technological equipment failure or disruption; . Information transmitted may not be sufficient (e.g. poor resolution of images) to allow for appropriate medical decision making by the Practitioner; and/or  . In rare instances, security protocols could fail, causing a breach of personal health information.  Furthermore, I acknowledge that it is my responsibility to provide information about my medical history, conditions and care that is complete and accurate to the best of my ability. I acknowledge that Practitioner's advice, recommendations, and/or decision may be based on factors not within their control, such as incomplete or inaccurate data provided by me or distortions of diagnostic images or specimens that may result from electronic transmissions. I understand that the practice of medicine is not an exact science and that Practitioner makes no warranties or guarantees  regarding treatment outcomes. I acknowledge that I will receive a copy of this consent concurrently upon execution via email to the email address I last provided but may also request a printed copy by calling the office of [facility].    I understand that my insurance will be billed for this visit.   I have read or had this consent read to me. . I understand the contents of this consent, which adequately explains the benefits and risks of the Services being provided via telemedicine.  . I have been provided ample opportunity to ask questions regarding this consent and the Services and have had my questions answered to my satisfaction. . I give my informed consent for the services to be provided through the use of telemedicine in my medical care  By participating in this telemedicine visit I agree to the above.

## 2018-09-01 ENCOUNTER — Encounter: Payer: Self-pay | Admitting: Family

## 2018-09-01 ENCOUNTER — Ambulatory Visit: Payer: PPO | Admitting: Family

## 2018-09-02 ENCOUNTER — Ambulatory Visit: Payer: PPO | Admitting: Pulmonary Disease

## 2018-09-03 ENCOUNTER — Ambulatory Visit: Payer: PPO | Admitting: Pulmonary Disease

## 2018-09-03 ENCOUNTER — Encounter: Payer: Self-pay | Admitting: Family

## 2018-09-10 ENCOUNTER — Ambulatory Visit: Payer: PPO | Admitting: Pulmonary Disease

## 2018-09-10 ENCOUNTER — Ambulatory Visit (INDEPENDENT_AMBULATORY_CARE_PROVIDER_SITE_OTHER): Payer: PPO | Admitting: Primary Care

## 2018-09-10 ENCOUNTER — Encounter: Payer: Self-pay | Admitting: Primary Care

## 2018-09-10 ENCOUNTER — Other Ambulatory Visit: Payer: Self-pay

## 2018-09-10 VITALS — BP 130/78 | HR 75 | Temp 98.5°F | Ht 61.5 in | Wt 126.0 lb

## 2018-09-10 DIAGNOSIS — J849 Interstitial pulmonary disease, unspecified: Secondary | ICD-10-CM | POA: Diagnosis not present

## 2018-09-10 DIAGNOSIS — M069 Rheumatoid arthritis, unspecified: Secondary | ICD-10-CM | POA: Insufficient documentation

## 2018-09-10 NOTE — Assessment & Plan Note (Signed)
-   Dx 2019 by Dr. Trudie Reed - Has apt with rheumatology next week for RA and ILD (requesting notes be sent to office)

## 2018-09-10 NOTE — Patient Instructions (Addendum)
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  Orders: 6 months 6MWT and spirometry with DLCO  Follow-up: Dr. Vaughan Browner in 6 months  Rheumatology as recommended (make sure they send [place] their notes)   Rheumatoid Arthritis Rheumatoid arthritis (RA) is a long-term (chronic) disease that causes inflammation in your joints. RA may start slowly. It most often affects the small joints of the hands and feet. Usually, the same joints are affected on both sides of your body. Inflammation from RA can also affect other parts of your body, including your heart, eyes, or lungs. There is no cure for RA, but medicines can help your symptoms and halt or slow down the progression of the disease. What are the causes? RA is an autoimmune disease. When you have an autoimmune disease, your body's defense system (immune system) mistakenly attacks healthy body tissues. The exact cause of RA is not known. What increases the risk? You are more likely to develop this condition if you:  Are a woman.  Have a family history of of RA or other autoimmune diseases.  Have a history of smoking.  Are obese.  Have been exposed to pollutants or chemicals. What are the signs or symptoms? The first symptom of this condition may be morning stiffness that lasts longer than 30 minutes.  Symptoms usually start gradually. They are often worse in the morning. As RA progresses, symptoms may include:  Pain, stiffness, swelling, warmth, and tenderness in joints on both sides of your body.  Loss of energy.  Loss of appetite.  Weight loss.  Low-grade fever.  Dry eyes and dry mouth.  Firm lumps (rheumatoid nodules) that grow beneath your skin in areas such as your forearm bones near your elbows and on your hands.  Changes in the appearance of joints (deformity) and  loss of joint function. Symptoms of this condition vary from person to person.  Symptoms of RA often come and go.  Sometimes, symptoms get worse for a period of time. These are called flares. How is this diagnosed? This condition is diagnosed based on your symptoms, medical history, and physical exam.  You may have X-rays or an MRI to check for the type of joint changes that are caused by RA. You may also have blood tests to look for:  Proteins (antibodies) that your immune system may make if you have RA. These include rheumatoid factor (RF) and anti-CCP. ? When blood tests show these proteins, you are said to have "seropositive RA." ? When blood tests do not show these proteins, you may have "seronegative RA."  Inflammation in your blood.  A low number of red blood cells (anemia). How is this treated? The goals of treatment are to relieve pain, reduce inflammation, and slow down or stop joint damage and disability. Treatment may include:  Lifestyle changes. It is important to rest as needed, eat a healthy diet, and exercise.  Medicines. Your health care provider may adjust your medicines every 3 months until treatment goals are reached. Common medicines include: ? Pain relievers (analgesics). ? Corticosteroids and NSAIDs to reduce inflammation. ? Disease-modifying antirheumatic drugs (DMARDs) to try to slow the course of the disease. ? Biologic response modifiers to reduce inflammation and damage.  Physical therapy and occupational therapy.  Surgery, if you have severe joint damage. Joint replacement or fusing of joints may be needed. Your health care provider will work with you  to identify the best treatment option for you based on assessment of the overall disease activity in your body. Follow these instructions at home: Activity  Return to your normal activities as told by your health care provider. Ask your health care provider what activities are safe for you.  Rest when  you are having a flare.  Start an exercise program as told by your health care provider. General instructions  Keep all follow-up visits as told by your health care provider. This is important.  Take over-the-counter and prescription medicines only as told by your health care provider. Where to find more information  [employer]: www.rheumatology.[place]: www.arthritis.org Contact a health care provider if:  You have a flare-up of RA symptoms.  You have a fever.  You have side effects from your medicines. Get help right away if:  You have chest pain.  You have trouble breathing.  You quickly develop a hot, painful joint that is more severe than your usual joint aches. Summary  Rheumatoid arthritis (RA) is a long-term (chronic) disease that causes inflammation in your joints.  RA is an autoimmune disease.  The goals of treatment are to relieve pain, reduce inflammation, and slow down or stop joint damage and disability. This information is not intended to replace advice given to you by your health care provider. Make sure you discuss any questions you have with your health care provider. Document Released: 01/06/2000 Document Revised: 09/10/2017 Document Reviewed: 09/10/2017 Elsevier Patient Education  2020 [place].    Pulmonary Fibrosis  Pulmonary fibrosis is a type of lung disease that causes scarring. Over time, the scar tissue builds up in the air sacs of your lungs (alveoli). This makes it hard for you to breathe. Less oxygen can get into your blood. Scarring from pulmonary fibrosis gets worse over time. This damage is permanent and may lead to other serious health problems. What are the causes? There are many different causes of pulmonary fibrosis. Sometimes the cause is not known. This is called idiopathic pulmonary fibrosis. Other causes include:  Exposure to chemicals and substances found in agricultural, farm,  Architect, or factory work. These include mold, asbestos, silica, metal dusts, and toxic fumes.  Sarcoidosis. In this disease, areas of inflammatory cells (granulomas) form and most often affect the lungs.  Autoimmune diseases. These include diseases such as rheumatoid arthritis, systemic sclerosis, or connective tissue disease.  Taking certain medicines. These include drugs used in radiation therapy or used to treat seizures, heart problems, and some infections. What increases the risk? You are more likely to develop this condition if:  You have a family history of the disease.  You are older. The condition is more common in older adults.  You have a history of smoking.  You have a job that exposes you to certain chemicals.  You have gastroesophageal reflux disease (GERD). What are the signs or symptoms? Symptoms of this condition include:  Difficulty breathing that gets worse with activity.  Shortness of breath (dyspnea).  Dry, hacking cough.  Rapid, shallow breathing during exercise or while at rest.  Bluish skin and lips.  Loss of appetite.  Weakness.  Weight loss and fatigue.  Rounded and enlarged fingertips (clubbing). How is this diagnosed? This condition may be diagnosed based on:  Your symptoms and medical history.  A physical exam. You may also have tests, including:  A test that involves looking inside your lungs with an instrument (bronchoscopy).  Imaging studies of your lungs  and heart.  Tests to measure how well you are breathing (pulmonary function tests).  Blood tests.  Tests to see how well your lungs work while you are walking (pulmonary stress test).  A procedure to remove a lung tissue sample to look at it under a microscope (biopsy). How is this treated? There is no cure for pulmonary fibrosis. Treatment focuses on managing symptoms and preventing scarring from getting worse. This may include:  Medicines, such as: ? Steroids to  prevent permanent lung changes. ? Medicines to suppress your body's defense system (immune system). ? Medicines to help with lung function by reducing inflammation or scarring.  Ongoing monitoring with X-rays and lab work.  Oxygen therapy.  Pulmonary rehabilitation.  Surgery. In some cases, a lung transplant is possible. Follow these instructions at home:     Medicines  Take over-the-counter and prescription medicines only as told by your health care provider.  Keep your vaccinations up to date as recommended by your health care provider. General instructions  Do not use any products that contain nicotine or tobacco, such as cigarettes and e-cigarettes. If you need help quitting, ask your health care provider.  Get regular exercise, but do not overexert yourself. Ask your health care provider to suggest some activities that are safe for you to do. ? If you have physical limitations, you may get exercise by walking, using a stationary bike, or doing chair exercises. ? Ask your health care provider about using oxygen while exercising.  If you are exposed to chemicals and substances at work, make sure that you wear a mask or respirator at all times.  Join a pulmonary rehabilitation program or a support group for people with pulmonary fibrosis.  Eat small meals often so you do not get too full. Overeating can make breathing trouble worse.  Maintain a healthy weight. Lose weight if you need to.  Do breathing exercises as directed by your health care provider.  Keep all follow-up visits as told by your health care provider. This is important. Contact a health care provider if you:  Have symptoms that do not get better with medicines.  Are not able to be as active as usual.  Have trouble taking a deep breath.  Have a fever or chills.  Have blue lips or skin.  Have clubbing of your fingers. Get help right away if you:  Have a sudden worsening of your symptoms.  Have  chest pain.  Cough up mucus that is dark in color.  Have a lot of headaches.  Get very confused or sleepy. Summary  Pulmonary fibrosis is a type of lung disease that causes scar tissue to build up in the air sacs of your lungs (alveoli) over time. Less oxygen can get into your blood. This makes it hard for you to breathe.  Scarring from pulmonary fibrosis gets worse over time. This damage is permanent and may lead to other serious health problems.  You are more likely to develop this condition if you have a family history of the condition or a job that exposes you to certain chemicals.  There is no cure for pulmonary fibrosis. Treatment focuses on managing symptoms and preventing scarring from getting worse. This information is not intended to replace advice given to you by your health care provider. Make sure you discuss any questions you have with your health care provider. Document Released: 03/31/2003 Document Revised: 02/13/2017 Document Reviewed: 02/13/2017 Elsevier Patient Education  2020 [employer].

## 2018-09-10 NOTE — Progress Notes (Signed)
@Patient  ID: Samantha Osborne, female    DOB: [birth year], 63 y.o.   MRN: [id number]  Chief Complaint  Patient presents with  . ILD (interstitial lung disease)    Same as before, but has productive cough with clear congestion.    Referring provider: Redmond School, MD  HPI: 63 year old female, current smoker. PMH significant for rheumatoid arthritis (dx 2019). Patient of Dr. Vaughan Browner, seen for initial consult on 07/29/18. CT chest June 2020 showed nonspecific pattern of fibrosis, ground-glass attenuation. Ordered for serology, mold panel, PFTs. Referred back to Dr. Trudie Reed for evaluation of her RA. ANA positive. Hypersensitive pneumonitis, anti-scleroderma antibody, ANCA negative. PFTs showed moderate diffusion defect which corrects for lung volume.   09/10/2018 Patient presents today for 1 month follow-up. She is doing well. Breathing has been good. States that she would wheeze and cough at night. Prednisone and cough medication really helped. Still coughing up some clear mucus. She was supposed to see Rheumatology before this visit but states that she went to the wrong location. She has an upcoming apt next week.    No Known Allergies   There is no immunization history on file for this patient.  Past Medical History:  Diagnosis Date  . Carotid artery occlusion   . DDD (degenerative disc disease)   . Depression   . DVT (deep venous thrombosis) ([place]) 2008   right leg  . Hypertension   . Thyroid disease     Tobacco History: Social History   Tobacco Use  Smoking Status Current Every Day Smoker  . Packs/day: 1.00  . Types: Cigarettes  . Start date: 03/29/1970  Smokeless Tobacco Never Used  Tobacco Comment   Less than 1 pk per day   Ready to quit: Not Answered Counseling given: Not Answered Comment: Less than 1 pk per day   Outpatient Medications Prior to Visit  Medication Sig Dispense Refill  . amLODipine (NORVASC) 5 MG tablet Take 5 mg by mouth daily.    Marland Kitchen aspirin EC 81 MG  tablet Take 81 mg by mouth daily.    . Aspirin-Acetaminophen-Caffeine (GOODY HEADACHE PO) Take 1 packet by mouth as needed (for pain).    Marland Kitchen buPROPion (WELLBUTRIN SR) 150 MG 12 hr tablet Take 1 tablet by mouth daily.    . butalbital-acetaminophen-caffeine (FIORICET, ESGIC) 50-325-40 MG tablet     . cholecalciferol (VITAMIN D) 1000 units tablet Take 1,000 Units by mouth daily.    . diazepam (VALIUM) 10 MG tablet Take 10 mg by mouth at bedtime as needed for sleep.     Marland Kitchen HYDROcodone-acetaminophen (NORCO) 10-325 MG per tablet Take 1 tablet by mouth every 4 (four) hours as needed. Reported on 07/05/2015    . levothyroxine (SYNTHROID) 25 MCG tablet Take 1 tablet by mouth daily.    Marland Kitchen losartan (COZAAR) 100 MG tablet Take 1 tablet by mouth daily.    . methocarbamol (ROBAXIN) 500 MG tablet Take 1 tablet (500 mg total) by mouth 4 (four) times daily. 28 tablet 0  . metoprolol tartrate (LOPRESSOR) 25 MG tablet Take 1 tablet (25 mg total) by mouth 2 (two) times daily. 180 tablet 3  . Omega-3 Fatty Acids (FISH OIL) 1000 MG CAPS Take 1 capsule by mouth daily.     Marland Kitchen oxyCODONE (OXY IR/ROXICODONE) 5 MG immediate release tablet 15 mg.     . pantoprazole (PROTONIX) 40 MG tablet Take 1 tablet (40 mg total) by mouth daily at 6 (six) AM. 30 tablet 1  . SYNTHROID 112  MCG tablet Take 112 mcg by mouth daily.     No facility-administered medications prior to visit.     Review of Systems  Review of Systems  Constitutional: Negative.   Respiratory: Positive for cough. Negative for shortness of breath and wheezing.   Cardiovascular: Negative.    Physical Exam  BP 130/78 (BP Location: Right Arm, Patient Position: Sitting, Cuff Size: Normal)   Pulse 75   Temp 98.5 F (36.9 C)   Ht 5' 1.5" (1.562 m)   Wt 126 lb (57.2 kg)   SpO2 98%   BMI 23.42 kg/m  Physical Exam Constitutional:      Appearance: Normal appearance.  HENT:     Head: Normocephalic and atraumatic.  Cardiovascular:     Rate and Rhythm: Normal rate  and regular rhythm.  Pulmonary:     Effort: Pulmonary effort is normal.     Breath sounds: Normal breath sounds.  Musculoskeletal: Normal range of motion.  Skin:    General: Skin is warm and dry.  Neurological:     General: No focal deficit present.     Mental Status: She is alert and oriented to person, place, and time. Mental status is at baseline.  Psychiatric:        Mood and Affect: Mood normal.        Behavior: Behavior normal.        Thought Content: Thought content normal.        Judgment: Judgment normal.      Lab Results:  CBC    Component Value Date/Time   WBC 10.6 (H) 03/15/2015 0953   RBC 4.23 03/15/2015 0953   HGB 13.4 03/15/2015 0953   HCT 39.8 03/15/2015 0953   PLT 276 03/15/2015 0953   MCV 94.1 03/15/2015 0953   MCH 31.7 03/15/2015 0953   MCHC 33.7 03/15/2015 0953   RDW 13.1 03/15/2015 0953   LYMPHSABS 3.9 03/15/2015 0953   MONOABS 0.7 03/15/2015 0953   EOSABS 0.4 03/15/2015 0953   BASOSABS 0.0 03/15/2015 0953    BMET    Component Value Date/Time   NA 141 03/15/2015 0953   K 4.1 03/15/2015 0953   CL 104 03/15/2015 0953   CO2 27 03/15/2015 0953   GLUCOSE 96 03/15/2015 0953   BUN 18 03/15/2015 0953   CREATININE 1.10 (H) 03/15/2015 0953   CALCIUM 9.0 03/15/2015 0953   GFRNONAA 53 (L) 03/15/2015 0953   GFRAA >60 03/15/2015 0953    BNP No results found for: BNP  ProBNP No results found for: PROBNP  Imaging: Vas US Carotid  Result Date: 08/26/2018 Carotid Arterial Duplex Study Indications:       Carotid artery disease. Risk Factors:      Hypertension, hyperlipidemia, current smoker. Limitations:       heavy breathing, respiration artifact Comparison Study:  No significant change when compared to previous study on                    02/25/2018. Performing Technologist: Burley Saver RVT  Examination Guidelines: A complete evaluation includes B-mode imaging, spectral Doppler, color Doppler, and power Doppler as needed of all accessible portions of  each vessel. Bilateral testing is considered an integral part of a complete examination. Limited examinations for reoccurring indications may be performed as noted.  Right Carotid Findings: +----------+--------+--------+--------+-------------------------+--------+           PSV cm/sEDV cm/sStenosisDescribe                 Comments +----------+--------+--------+--------+-------------------------+--------+  CCA Prox  53      19                                                +----------+--------+--------+--------+-------------------------+--------+ CCA Mid   71      15      <50%    calcific                          +----------+--------+--------+--------+-------------------------+--------+ CCA Distal98      29      <50%    heterogenous                      +----------+--------+--------+--------+-------------------------+--------+ ICA Prox  162     54      40-59%  heterogenous and calcific         +----------+--------+--------+--------+-------------------------+--------+ ICA Mid   96      38                                                +----------+--------+--------+--------+-------------------------+--------+ ICA Distal90      32                                                +----------+--------+--------+--------+-------------------------+--------+ ECA       86      17              heterogenous             <50%     +----------+--------+--------+--------+-------------------------+--------+ +----------+--------+-------+----------------+-------------------+           PSV cm/sEDV cmsDescribe        Arm Pressure (mmHG) +----------+--------+-------+----------------+-------------------+ [id number]             Multiphasic, WNL                    +----------+--------+-------+----------------+-------------------+ +---------+--------+--+--------+--+---------+ VertebralPSV cm/s54EDV cm/s11Antegrade +---------+--------+--+--------+--+---------+  Left  Carotid Findings: +----------+--------+--------+--------+-------------------------+--------+           PSV cm/sEDV cm/sStenosisDescribe                 Comments +----------+--------+--------+--------+-------------------------+--------+ CCA Prox  83      31                                                +----------+--------+--------+--------+-------------------------+--------+ CCA Mid   67      23                                                +----------+--------+--------+--------+-------------------------+--------+ CCA Distal82      31      <50%    heterogenous and calcific         +----------+--------+--------+--------+-------------------------+--------+ ICA Prox  96      43      40-59%  calcific                          +----------+--------+--------+--------+-------------------------+--------+  ICA Mid   198     70      60-79%  heterogenous                      +----------+--------+--------+--------+-------------------------+--------+ ICA Distal160     57                                                +----------+--------+--------+--------+-------------------------+--------+ ECA       219     41      >50%    heterogenous                      +----------+--------+--------+--------+-------------------------+--------+ +----------+--------+--------+----------------+-------------------+ SubclavianPSV cm/sEDV cm/sDescribe        Arm Pressure (mmHG) +----------+--------+--------+----------------+-------------------+           144             Multiphasic, WNL                    +----------+--------+--------+----------------+-------------------+ +---------+--------+--+--------+--+---------+ VertebralPSV cm/s74EDV cm/s26Antegrade +---------+--------+--+--------+--+---------+  Summary: Right Carotid: Velocities in the right ICA are consistent with a 40-59%                stenosis. Non-hemodynamically significant plaque <50% noted in                the  CCA. The ECA appears <50% stenosed. Left Carotid: Velocities in the left ICA are consistent with a 60-79% stenosis.               Non-hemodynamically significant plaque noted in the CCA. The ECA               appears >50% stenosed. Vertebrals:  Bilateral vertebral arteries demonstrate antegrade flow. Subclavians: Normal flow hemodynamics were seen in bilateral subclavian              arteries. *See table(s) above for measurements and observations.  Electronically signed by Curt Jews MD on 08/26/2018 at 12:36:52 PM.    Final      Assessment & Plan:   ILD (interstitial lung disease) ([place]) - Stable - CT chest June 2020 showed nonspecific pattern of fibrosis, ground-glass attenuation - ANA positive. Hypersensitive pneumonitis, anti-scleroderma antibody, ANCA negative - PFTs showed moderate diffusion defect which corrects for lung volume - Needs follow-up in 6 months with 6MWT and spirometry/DLCO  Rheumatoid arthritis ([place]) - Dx 2019 by Dr. Trudie Reed - Has apt with rheumatology next week for RA and ILD (requesting notes be sent to office)    Martyn Ehrich, NP 09/10/2018

## 2018-09-10 NOTE — Assessment & Plan Note (Addendum)
-   Stable - CT chest June 2020 showed nonspecific pattern of fibrosis, ground-glass attenuation - ANA positive. Hypersensitive pneumonitis, anti-scleroderma antibody, ANCA negative - PFTs showed moderate diffusion defect which corrects for lung volume - Needs follow-up in 6 months with 6MWT and spirometry/DLCO

## 2018-09-11 DIAGNOSIS — F1120 Opioid dependence, uncomplicated: Secondary | ICD-10-CM | POA: Diagnosis not present

## 2018-09-11 DIAGNOSIS — Z6821 Body mass index (BMI) 21.0-21.9, adult: Secondary | ICD-10-CM | POA: Diagnosis not present

## 2018-09-11 DIAGNOSIS — G4489 Other headache syndrome: Secondary | ICD-10-CM | POA: Diagnosis not present

## 2018-09-11 DIAGNOSIS — M069 Rheumatoid arthritis, unspecified: Secondary | ICD-10-CM | POA: Diagnosis not present

## 2018-09-15 DIAGNOSIS — M545 Low back pain: Secondary | ICD-10-CM | POA: Diagnosis not present

## 2018-09-15 DIAGNOSIS — Z6821 Body mass index (BMI) 21.0-21.9, adult: Secondary | ICD-10-CM | POA: Diagnosis not present

## 2018-09-15 DIAGNOSIS — J849 Interstitial pulmonary disease, unspecified: Secondary | ICD-10-CM | POA: Diagnosis not present

## 2018-09-15 DIAGNOSIS — M2550 Pain in unspecified joint: Secondary | ICD-10-CM | POA: Diagnosis not present

## 2018-09-15 DIAGNOSIS — M542 Cervicalgia: Secondary | ICD-10-CM | POA: Diagnosis not present

## 2018-09-15 DIAGNOSIS — M150 Primary generalized (osteo)arthritis: Secondary | ICD-10-CM | POA: Diagnosis not present

## 2018-09-18 ENCOUNTER — Other Ambulatory Visit: Payer: Self-pay

## 2018-09-18 ENCOUNTER — Encounter: Payer: Self-pay | Admitting: Family

## 2018-09-18 ENCOUNTER — Ambulatory Visit (INDEPENDENT_AMBULATORY_CARE_PROVIDER_SITE_OTHER): Payer: PPO | Admitting: Family

## 2018-09-18 DIAGNOSIS — I6523 Occlusion and stenosis of bilateral carotid arteries: Secondary | ICD-10-CM

## 2018-09-18 NOTE — Progress Notes (Signed)
Virtual Visit via Telephone Note   I connected with Samantha Osborne on 09/18/2018 using the [employer] by telephone and verified that I was speaking with the correct person using two identifiers. Patient was located at her home and accompanied by herself. I am located at the [facility] office/clinic.   The limitations of evaluation and management by telemedicine and the availability of in person appointments have been previously discussed with the patient and are documented in the patients chart. The patient expressed understanding and consented to proceed.  PCP: Redmond School, MD  Chief Complaint: Follow up extracranial carotid artery stenosis   History of Present Illness: Samantha Osborne is a 63 y.o. female whom Dr. Donnetta Hutching has monitored for extracranial carotid artery stenosis.  Dr. Donnetta Hutching last evaluated pt on 02-25-18. At that time carotid duplex revealed no change in her prior study.  She had a 40 to 59% right carotid stenosis and 60 to 79% left carotid stenosis. She was to follow up in 6 months with repeats carotid duplex.   She denies any history of stroke or TIA symptoms. She denies any hx of MI.  She is being evaluated by cardiology for what sounds like valvular problems.   Diabetic: no Tobacco use: smoker  (1 ppd since 1972)  Pt meds include: Statin : no, is using Repatha ASA: yes Other anticoagulants/antiplatelets: no   Past Medical History:  Diagnosis Date  . Carotid artery occlusion   . DDD (degenerative disc disease)   . Depression   . DVT (deep venous thrombosis) ([place]) 2008   right leg  . Hypertension   . Thyroid disease     Past Surgical History:  Procedure Laterality Date  . CESAREAN SECTION    . DILATION AND CURETTAGE OF UTERUS    . SHOULDER ARTHROSCOPY Left 2005    Current Outpatient Medications on File Prior to Visit  Medication Sig Dispense Refill  . amLODipine (NORVASC) 5 MG tablet Take 5 mg by mouth daily.    Marland Kitchen aspirin EC 81 MG tablet Take 81 mg by mouth  daily.    . Aspirin-Acetaminophen-Caffeine (GOODY HEADACHE PO) Take 1 packet by mouth as needed (for pain).    Marland Kitchen buPROPion (WELLBUTRIN SR) 150 MG 12 hr tablet Take 1 tablet by mouth daily.    . butalbital-acetaminophen-caffeine (FIORICET, ESGIC) 50-325-40 MG tablet     . cholecalciferol (VITAMIN D) 1000 units tablet Take 1,000 Units by mouth daily.    . diazepam (VALIUM) 10 MG tablet Take 10 mg by mouth at bedtime as needed for sleep.     . Evolocumab with Infusor ([place]) 420 MG/3.5ML SOCT Inject into the skin.    Marland Kitchen HYDROcodone-acetaminophen (NORCO) 10-325 MG per tablet Take 1 tablet by mouth every 4 (four) hours as needed. Reported on 07/05/2015    . levothyroxine (SYNTHROID) 25 MCG tablet Take 1 tablet by mouth daily.    Marland Kitchen losartan (COZAAR) 100 MG tablet Take 1 tablet by mouth daily.    . methocarbamol (ROBAXIN) 500 MG tablet Take 1 tablet (500 mg total) by mouth 4 (four) times daily. 28 tablet 0  . metoprolol tartrate (LOPRESSOR) 25 MG tablet Take 1 tablet (25 mg total) by mouth 2 (two) times daily. 180 tablet 3  . Omega-3 Fatty Acids (FISH OIL) 1000 MG CAPS Take 1 capsule by mouth daily.     Marland Kitchen oxyCODONE (OXY IR/ROXICODONE) 5 MG immediate release tablet 15 mg.     . pantoprazole (PROTONIX) 40 MG tablet Take  1 tablet (40 mg total) by mouth daily at 6 (six) AM. 30 tablet 1  . SYNTHROID 112 MCG tablet Take 112 mcg by mouth daily.     No current facility-administered medications on file prior to visit.     12 system ROS was negative unless otherwise noted in HPI   Observations/Objective:  [facility] Duplex (08-26-18): Right Carotid: Velocities in the right ICA are consistent with a 40-59%                stenosis. Non-hemodynamically significant plaque <50% noted in                the CCA. The ECA appears <50% stenosed. Left Carotid: Velocities in the left ICA are consistent with a 60-79% stenosis.               Non-hemodynamically significant plaque noted in the CCA. The ECA                appears >50% stenosed. Vertebrals:  Bilateral vertebral arteries demonstrate antegrade flow. Subclavians: Normal flow hemodynamics were seen in bilateral subclavian arteries. No change compared to the exam on 02-25-18.   Assessment and Plan: Asymptomatic stable bilateral ICA stenosis.  She does not have DM, she does continue to smoke. Over 3 minutes was spent counseling patient re smoking cessation, and patient was given several free resources re smoking cessation.   She takes Repatha for dyslipidemia.    Follow Up Instructions:   Follow up in 6 months with carotid duplex.    I discussed the assessment and treatment plan with the patient. The patient was provided an opportunity to ask questions and all were answered. The patient agreed with the plan and demonstrated an understanding of the instructions.   The patient was advised to call back or seek an in-person evaluation if the symptoms worsen or if the condition fails to improve as anticipated.  I spent 13 minutes with the patient via telephone encounter.   [name] [facility] Office: [phone]  09/18/2018, 8:48 AM

## 2018-09-18 NOTE — Patient Instructions (Signed)
Steps to Quit Smoking Smoking tobacco is the leading cause of preventable death. It can affect almost every organ in the body. Smoking puts you and people around you at risk for many serious, long-lasting (chronic) diseases. Quitting smoking can be hard, but it is one of the best things that you can do for your health. It is never too late to quit. How do I get ready to quit? When you decide to quit smoking, make a plan to help you succeed. Before you quit:  Pick a date to quit. Set a date within the next 2 weeks to give you time to prepare.  Write down the reasons why you are quitting. Keep this list in places where you will see it often.  Tell your family, friends, and co-workers that you are quitting. Their support is important.  Talk with your doctor about the choices that may help you quit.  Find out if your health insurance will pay for these treatments.  Know the people, places, things, and activities that make you want to smoke (triggers). Avoid them. What first steps can I take to quit smoking?  Throw away all cigarettes at home, at work, and in your car.  Throw away the things that you use when you smoke, such as ashtrays and lighters.  Clean your car. Make sure to empty the ashtray.  Clean your home, including curtains and carpets. What can I do to help me quit smoking? Talk with your doctor about taking medicines and seeing a counselor at the same time. You are more likely to succeed when you do both.  If you are pregnant or breastfeeding, talk with your doctor about counseling or other ways to quit smoking. Do not take medicine to help you quit smoking unless your doctor tells you to do so. To quit smoking: Quit right away  Quit smoking totally, instead of slowly cutting back on how much you smoke over a period of time.  Go to counseling. You are more likely to quit if you go to counseling sessions regularly. Take medicine You may take medicines to help you quit. Some  medicines need a prescription, and some you can buy over-the-counter. Some medicines may contain a drug called nicotine to replace the nicotine in cigarettes. Medicines may:  Help you to stop having the desire to smoke (cravings).  Help to stop the problems that come when you stop smoking (withdrawal symptoms). Your doctor may ask you to use:  Nicotine patches, gum, or lozenges.  Nicotine inhalers or sprays.  Non-nicotine medicine that is taken by mouth. Find resources Find resources and other ways to help you quit smoking and remain smoke-free after you quit. These resources are most helpful when you use them often. They include:  Online chats with a counselor.  Phone quitlines.  Printed self-help materials.  Support groups or group counseling.  Text messaging programs.  Mobile phone apps. Use apps on your mobile phone or tablet that can help you stick to your quit plan. There are many free apps for mobile phones and tablets as well as websites. Examples include Quit Guide from the CDC and smokefree.gov  What things can I do to make it easier to quit?   Talk to your family and friends. Ask them to support and encourage you.  Call a phone quitline (1-800-QUIT-NOW), reach out to support groups, or work with a counselor.  Ask people who smoke to not smoke around you.  Avoid places that make you want to smoke,   such as: ? Bars. ? Parties. ? Smoke-break areas at work.  Spend time with people who do not smoke.  Lower the stress in your life. Stress can make you want to smoke. Try these things to help your stress: ? Getting regular exercise. ? Doing deep-breathing exercises. ? Doing yoga. ? Meditating. ? Doing a body scan. To do this, close your eyes, focus on one area of your body at a time from head to toe. Notice which parts of your body are tense. Try to relax the muscles in those areas. How will I feel when I quit smoking? Day 1 to 3 weeks Within the first 24 hours,  you may start to have some problems that come from quitting tobacco. These problems are very bad 2-3 days after you quit, but they do not often last for more than 2-3 weeks. You may get these symptoms:  Mood swings.  Feeling restless, nervous, angry, or annoyed.  Trouble concentrating.  Dizziness.  Strong desire for high-sugar foods and nicotine.  Weight gain.  Trouble pooping (constipation).  Feeling like you may vomit (nausea).  Coughing or a sore throat.  Changes in how the medicines that you take for other issues work in your body.  Depression.  Trouble sleeping (insomnia). Week 3 and afterward After the first 2-3 weeks of quitting, you may start to notice more positive results, such as:  Better sense of smell and taste.  Less coughing and sore throat.  Slower heart rate.  Lower blood pressure.  Clearer skin.  Better breathing.  Fewer sick days. Quitting smoking can be hard. Do not give up if you fail the first time. Some people need to try a few times before they succeed. Do your best to stick to your quit plan, and talk with your doctor if you have any questions or concerns. Summary  Smoking tobacco is the leading cause of preventable death. Quitting smoking can be hard, but it is one of the best things that you can do for your health.  When you decide to quit smoking, make a plan to help you succeed.  Quit smoking right away, not slowly over a period of time.  When you start quitting, seek help from your doctor, family, or friends. This information is not intended to replace advice given to you by your health care provider. Make sure you discuss any questions you have with your health care provider. Document Released: 11/04/2008 Document Revised: 03/28/2018 Document Reviewed: 03/29/2018 Elsevier Patient Education  2020 Elsevier Inc.     Stroke Prevention Some medical conditions and lifestyle choices can lead to a higher risk for a stroke. You can  help to prevent a stroke by making nutrition, lifestyle, and other changes. What nutrition changes can be made?   Eat healthy foods. ? Choose foods that are high in fiber. These include:  Fresh fruits.  Fresh vegetables.  Whole grains. ? Eat at least 5 or more servings of fruits and vegetables each day. Try to fill half of your plate at each meal with fruits and vegetables. ? Choose lean protein foods. These include:  Lowfat (lean) cuts of meat.  Chicken without skin.  Fish.  Tofu.  Beans.  Nuts. ? Eat low-fat dairy products. ? Avoid foods that:  Are high in salt (sodium).  Have saturated fat.  Have trans fat.  Have cholesterol.  Are processed.  Are premade.  Follow eating guidelines as told by your doctor. These may include: ? Reducing how many calories you   eat and drink each day. ? Limiting how much salt you eat or drink each day to 1,500 milligrams (mg). ? Using only healthy fats for cooking. These include:  Olive oil.  Canola oil.  Sunflower oil. ? Counting how many carbohydrates you eat and drink each day. What lifestyle changes can be made?  Try to stay at a healthy weight. Talk to your doctor about what a good weight is for you.  Get at least 30 minutes of moderate physical activity at least 5 days a week. This can include: ? Fast walking. ? Biking. ? Swimming.  Do not use any products that have nicotine or tobacco. This includes cigarettes and e-cigarettes. If you need help quitting, ask your doctor. Avoid being around tobacco smoke in general.  Limit how much alcohol you drink to no more than 1 drink a day for nonpregnant women and 2 drinks a day for men. One drink equals 12 oz of beer, 5 oz of wine, or 1 oz of hard liquor.  Do not use drugs.  Avoid taking birth control pills. Talk to your doctor about the risks of taking birth control pills if: ? You are over 35 years old. ? You smoke. ? You get migraines. ? You have had a blood clot.  What other changes can be made?  Manage your cholesterol. ? It is important to eat a healthy diet. ? If your cholesterol cannot be managed through your diet, you may also need to take medicines. Take medicines as told by your doctor.  Manage your diabetes. ? It is important to eat a healthy diet and to exercise regularly. ? If your blood sugar cannot be managed through diet and exercise, you may need to take medicines. Take medicines as told by your doctor.  Control your high blood pressure (hypertension). ? Try to keep your blood pressure below 130/80. This can help lower your risk of stroke. ? It is important to eat a healthy diet and to exercise regularly. ? If your blood pressure cannot be managed through diet and exercise, you may need to take medicines. Take medicines as told by your doctor. ? Ask your doctor if you should check your blood pressure at home. ? Have your blood pressure checked every year. Do this even if your blood pressure is normal.  Talk to your doctor about getting checked for a sleep disorder. Signs of this can include: ? Snoring a lot. ? Feeling very tired.  Take over-the-counter and prescription medicines only as told by your doctor. These may include aspirin or blood thinners (antiplatelets or anticoagulants).  Make sure that any other medical conditions you have are managed. Where to find more information  American Stroke Association: www.strokeassociation.org  National Stroke Association: www.stroke.org Get help right away if:  You have any symptoms of stroke. "BE FAST" is an easy way to remember the main warning signs: ? B - Balance. Signs are dizziness, sudden trouble walking, or loss of balance. ? E - Eyes. Signs are trouble seeing or a sudden change in how you see. ? F - Face. Signs are sudden weakness or loss of feeling of the face, or the face or eyelid drooping on one side. ? A - Arms. Signs are weakness or loss of feeling in an arm. This  happens suddenly and usually on one side of the body. ? S - Speech. Signs are sudden trouble speaking, slurred speech, or trouble understanding what people say. ? T - Time. Time to call emergency   services. Write down what time symptoms started.  You have other signs of stroke, such as: ? A sudden, very bad headache with no known cause. ? Feeling sick to your stomach (nausea). ? Throwing up (vomiting). ? Jerky movements you cannot control (seizure). These symptoms may represent a serious problem that is an emergency. Do not wait to see if the symptoms will go away. Get medical help right away. Call your local emergency services (911 in the [place] Do not drive yourself to the hospital. Summary  You can prevent a stroke by eating healthy, exercising, not smoking, drinking less alcohol, and treating other health problems, such as diabetes, high blood pressure, or high cholesterol.  Do not use any products that contain nicotine or tobacco, such as cigarettes and e-cigarettes.  Get help right away if you have any signs or symptoms of a stroke. This information is not intended to replace advice given to you by your health care provider. Make sure you discuss any questions you have with your health care provider. Document Released: 07/10/2011 Document Revised: 03/06/2018 Document Reviewed: 04/11/2016 Elsevier Patient Education  2020 Elsevier Inc.  

## 2018-10-06 DIAGNOSIS — E039 Hypothyroidism, unspecified: Secondary | ICD-10-CM | POA: Diagnosis not present

## 2018-10-06 DIAGNOSIS — E063 Autoimmune thyroiditis: Secondary | ICD-10-CM | POA: Diagnosis not present

## 2018-10-06 DIAGNOSIS — J849 Interstitial pulmonary disease, unspecified: Secondary | ICD-10-CM | POA: Diagnosis not present

## 2018-10-06 DIAGNOSIS — M1991 Primary osteoarthritis, unspecified site: Secondary | ICD-10-CM | POA: Diagnosis not present

## 2018-10-06 DIAGNOSIS — Z6822 Body mass index (BMI) 22.0-22.9, adult: Secondary | ICD-10-CM | POA: Diagnosis not present

## 2018-10-06 DIAGNOSIS — I10 Essential (primary) hypertension: Secondary | ICD-10-CM | POA: Diagnosis not present

## 2018-10-06 DIAGNOSIS — D72829 Elevated white blood cell count, unspecified: Secondary | ICD-10-CM | POA: Diagnosis not present

## 2018-10-06 DIAGNOSIS — M2550 Pain in unspecified joint: Secondary | ICD-10-CM | POA: Diagnosis not present

## 2018-11-06 DIAGNOSIS — G894 Chronic pain syndrome: Secondary | ICD-10-CM | POA: Diagnosis not present

## 2018-11-06 DIAGNOSIS — M5136 Other intervertebral disc degeneration, lumbar region: Secondary | ICD-10-CM | POA: Diagnosis not present

## 2018-11-06 DIAGNOSIS — E271 Primary adrenocortical insufficiency: Secondary | ICD-10-CM | POA: Diagnosis not present

## 2018-11-06 DIAGNOSIS — Z6822 Body mass index (BMI) 22.0-22.9, adult: Secondary | ICD-10-CM | POA: Diagnosis not present

## 2018-12-04 DIAGNOSIS — E271 Primary adrenocortical insufficiency: Secondary | ICD-10-CM | POA: Diagnosis not present

## 2018-12-04 DIAGNOSIS — M5136 Other intervertebral disc degeneration, lumbar region: Secondary | ICD-10-CM | POA: Diagnosis not present

## 2018-12-04 DIAGNOSIS — E0500 Thyrotoxicosis with diffuse goiter without thyrotoxic crisis or storm: Secondary | ICD-10-CM | POA: Diagnosis not present

## 2018-12-04 DIAGNOSIS — G894 Chronic pain syndrome: Secondary | ICD-10-CM | POA: Diagnosis not present

## 2019-01-02 DIAGNOSIS — G894 Chronic pain syndrome: Secondary | ICD-10-CM | POA: Diagnosis not present

## 2019-01-02 DIAGNOSIS — Z6822 Body mass index (BMI) 22.0-22.9, adult: Secondary | ICD-10-CM | POA: Diagnosis not present

## 2019-01-02 DIAGNOSIS — H532 Diplopia: Secondary | ICD-10-CM | POA: Diagnosis not present

## 2019-01-02 DIAGNOSIS — M1991 Primary osteoarthritis, unspecified site: Secondary | ICD-10-CM | POA: Diagnosis not present

## 2019-01-02 DIAGNOSIS — L309 Dermatitis, unspecified: Secondary | ICD-10-CM | POA: Diagnosis not present

## 2019-01-02 DIAGNOSIS — K219 Gastro-esophageal reflux disease without esophagitis: Secondary | ICD-10-CM | POA: Diagnosis not present

## 2019-01-02 DIAGNOSIS — E063 Autoimmune thyroiditis: Secondary | ICD-10-CM | POA: Diagnosis not present

## 2019-01-30 DIAGNOSIS — G894 Chronic pain syndrome: Secondary | ICD-10-CM | POA: Diagnosis not present

## 2019-02-19 DIAGNOSIS — Z6822 Body mass index (BMI) 22.0-22.9, adult: Secondary | ICD-10-CM | POA: Diagnosis not present

## 2019-02-19 DIAGNOSIS — E063 Autoimmune thyroiditis: Secondary | ICD-10-CM | POA: Diagnosis not present

## 2019-02-19 DIAGNOSIS — Z0001 Encounter for general adult medical examination with abnormal findings: Secondary | ICD-10-CM | POA: Diagnosis not present

## 2019-02-19 DIAGNOSIS — G894 Chronic pain syndrome: Secondary | ICD-10-CM | POA: Diagnosis not present

## 2019-02-19 DIAGNOSIS — I10 Essential (primary) hypertension: Secondary | ICD-10-CM | POA: Diagnosis not present

## 2019-02-19 DIAGNOSIS — K219 Gastro-esophageal reflux disease without esophagitis: Secondary | ICD-10-CM | POA: Diagnosis not present

## 2019-02-19 DIAGNOSIS — Z1389 Encounter for screening for other disorder: Secondary | ICD-10-CM | POA: Diagnosis not present

## 2019-03-18 DIAGNOSIS — J849 Interstitial pulmonary disease, unspecified: Secondary | ICD-10-CM | POA: Diagnosis not present

## 2019-03-18 DIAGNOSIS — M2550 Pain in unspecified joint: Secondary | ICD-10-CM | POA: Diagnosis not present

## 2019-03-18 DIAGNOSIS — M150 Primary generalized (osteo)arthritis: Secondary | ICD-10-CM | POA: Diagnosis not present

## 2019-03-18 DIAGNOSIS — Z6821 Body mass index (BMI) 21.0-21.9, adult: Secondary | ICD-10-CM | POA: Diagnosis not present

## 2019-03-18 DIAGNOSIS — M542 Cervicalgia: Secondary | ICD-10-CM | POA: Diagnosis not present

## 2019-03-18 DIAGNOSIS — M154 Erosive (osteo)arthritis: Secondary | ICD-10-CM | POA: Diagnosis not present

## 2019-03-19 ENCOUNTER — Ambulatory Visit: Payer: PPO | Admitting: Cardiology

## 2019-03-20 ENCOUNTER — Other Ambulatory Visit: Payer: Self-pay

## 2019-03-20 ENCOUNTER — Encounter: Payer: Self-pay | Admitting: Cardiology

## 2019-03-20 ENCOUNTER — Ambulatory Visit (INDEPENDENT_AMBULATORY_CARE_PROVIDER_SITE_OTHER): Payer: PPO | Admitting: Cardiology

## 2019-03-20 VITALS — BP 130/69 | HR 77 | Temp 98.0°F | Ht 64.0 in | Wt 128.0 lb

## 2019-03-20 DIAGNOSIS — Q231 Congenital insufficiency of aortic valve: Secondary | ICD-10-CM | POA: Diagnosis not present

## 2019-03-20 DIAGNOSIS — R002 Palpitations: Secondary | ICD-10-CM

## 2019-03-20 DIAGNOSIS — E782 Mixed hyperlipidemia: Secondary | ICD-10-CM | POA: Diagnosis not present

## 2019-03-20 DIAGNOSIS — I10 Essential (primary) hypertension: Secondary | ICD-10-CM | POA: Diagnosis not present

## 2019-03-20 NOTE — Progress Notes (Signed)
Clinical Summary Samantha Osborne is a 64 y.o.female seen today for follow up of the following medical problems.    1. Prior history of chest pain -  admit [facility] 02/2015. Negative workup for ACS, echo showed LVEF 36-14%, grade I diastolic dysfunction, no WMAs - she was referred for outpatient stress test. Nuclear stress 03/2015 showed no ischemia, LVEF 30-44%  - denies any chest pain    2. Carotid stenosis - followed by vascular - 04/3152 RIC 00-86, LICA 76-19  3. Hyperlipidemia - statin intolerance. She reports intolerance to zetia as well - she reports pcp has discussed repatha but she has been reluctant.  - started repatha by pcp, tolerating well.   4. Palpitations - high stress at home caring for her elderly father - started about 2-3 weeks ago. Fluttering midchest, she listened to her heart with a stethoscope and sounded like irregular beats - can last several hours. +fatigue - 1 cup of coffee in AM, noncaffeinated. No EtoH - symptoms occur few times a week.  - event monitor with just occasional PACs , short run NSVT - no significant palpitations since last visit  5. ILD - followed by pulmonary   6. HTN - compliant with meds  7. Bicuspid AV - noted by 02/2018 echo, very mild stenosis - she reports her mother had issues with her aortic valve and aortic aneurysm in her 91s, unclear if she may have had a bicuspid valve.    Past Medical History:  Diagnosis Date  . Carotid artery occlusion   . DDD (degenerative disc disease)   . Depression   . DVT (deep venous thrombosis) ([place]) 2008   right leg  . Hypertension   . Thyroid disease      No Known Allergies   Current Outpatient Medications  Medication Sig Dispense Refill  . amLODipine (NORVASC) 5 MG tablet Take 5 mg by mouth daily.    Marland Kitchen aspirin EC 81 MG tablet Take 81 mg by mouth daily.    . Aspirin-Acetaminophen-Caffeine (GOODY HEADACHE PO) Take 1 packet by mouth as needed (for pain).    Marland Kitchen  buPROPion (WELLBUTRIN SR) 150 MG 12 hr tablet Take 1 tablet by mouth daily.    . butalbital-acetaminophen-caffeine (FIORICET, ESGIC) 50-325-40 MG tablet     . cholecalciferol (VITAMIN D) 1000 units tablet Take 1,000 Units by mouth daily.    . diazepam (VALIUM) 10 MG tablet Take 10 mg by mouth at bedtime as needed for sleep.     . Evolocumab with Infusor ([place]) 420 MG/3.5ML SOCT Inject into the skin.    Marland Kitchen HYDROcodone-acetaminophen (NORCO) 10-325 MG per tablet Take 1 tablet by mouth every 4 (four) hours as needed. Reported on 07/05/2015    . levothyroxine (SYNTHROID) 25 MCG tablet Take 1 tablet by mouth daily.    Marland Kitchen losartan (COZAAR) 100 MG tablet Take 1 tablet by mouth daily.    . methocarbamol (ROBAXIN) 500 MG tablet Take 1 tablet (500 mg total) by mouth 4 (four) times daily. 28 tablet 0  . metoprolol tartrate (LOPRESSOR) 25 MG tablet Take 1 tablet (25 mg total) by mouth 2 (two) times daily. 180 tablet 3  . Omega-3 Fatty Acids (FISH OIL) 1000 MG CAPS Take 1 capsule by mouth daily.     Marland Kitchen oxyCODONE (OXY IR/ROXICODONE) 5 MG immediate release tablet 15 mg.     . pantoprazole (PROTONIX) 40 MG tablet Take 1 tablet (40 mg total) by mouth daily at 6 (six) AM. 30  tablet 1  . SYNTHROID 112 MCG tablet Take 112 mcg by mouth daily.     No current facility-administered medications for this visit.     Past Surgical History:  Procedure Laterality Date  . CESAREAN SECTION    . DILATION AND CURETTAGE OF UTERUS    . SHOULDER ARTHROSCOPY Left 2005     No Known Allergies    Family History  Problem Relation Age of Onset  . Heart disease Mother   . Hyperlipidemia Mother   . Heart attack Mother   . AAA (abdominal aortic aneurysm) Mother   . Cancer Father   . Heart disease Brother   . Hypertension Brother   . Hypertension Son      Social History Samantha Osborne reports that she has been smoking cigarettes. She started smoking about 49 years ago. She has been smoking about 1.00 pack  per day. She has never used smokeless tobacco. Samantha Osborne reports no history of alcohol use.   Review of Systems CONSTITUTIONAL: No weight loss, fever, chills, weakness or fatigue.  HEENT: Eyes: No visual loss, blurred vision, double vision or yellow sclerae.No hearing loss, sneezing, congestion, runny nose or sore throat.  SKIN: No rash or itching.  CARDIOVASCULAR: per hpi RESPIRATORY: No shortness of breath, cough or sputum.  GASTROINTESTINAL: No anorexia, nausea, vomiting or diarrhea. No abdominal pain or blood.  GENITOURINARY: No burning on urination, no polyuria NEUROLOGICAL: No headache, dizziness, syncope, paralysis, ataxia, numbness or tingling in the extremities. No change in bowel or bladder control.  MUSCULOSKELETAL: No muscle, back pain, joint pain or stiffness.  LYMPHATICS: No enlarged nodes. No history of splenectomy.  PSYCHIATRIC: No history of depression or anxiety.  ENDOCRINOLOGIC: No reports of sweating, cold or heat intolerance. No polyuria or polydipsia.  Marland Kitchen   Physical Examination Today's Vitals   03/20/19 1327  BP: 130/69  Pulse: 77  Temp: 98 F (36.7 C)  TempSrc: Temporal  SpO2: 98%  Weight: 128 lb (58.1 kg)  Height: 5\' 4"  (1.626 m)   Body mass index is 21.97 kg/m.  Gen: resting comfortably, no acute distress HEENT: no scleral icterus, pupils equal round and reactive, no palptable cervical adenopathy,  CV: RRR, no m/r/g, no jvd Resp: Clear to auscultation bilaterally GI: abdomen is soft, non-tender, non-distended, normal bowel sounds, no hepatosplenomegaly MSK: extremities are warm, no edema.  Skin: warm, no rash Neuro:  no focal deficits Psych: appropriate affect   Diagnostic Studies  02/2015 echo Study Conclusions  - Left ventricle: The cavity size was normal. Wall thickness was increased in a pattern of mild LVH. Systolic function was normal. The estimated ejection fraction was in the range of 60% to 65%. Wall motion was normal; there  were no regional wall motion abnormalities. Doppler parameters are consistent with abnormal left ventricular relaxation (grade 1 diastolic dysfunction). - Aortic valve: Mildly to moderately calcified annulus. Mildly thickened leaflets. - Mitral valve: Calcified annulus. Normal thickness leaflets . - Tricuspid valve: There was mild regurgitation.  03/2015 Nuclear stress test  There was no ST segment deviation noted during stress.  The study is normal. There are no perfusion defects consistent with prior infarct or current ischemia.  This is an intermediate risk study. Intermediate risk based on low calculated LVEF. Visually the LVEF looks normal, consider correlation with echo. There is no myocardium at jeopardy.  The left ventricular ejection fraction is moderately decreased (30-44%).   02/2018 14 day event monitor  14 day event monitor  Min HR 49, Max HR  170, Avg HR 74  Rare supraventricular ectopy (<1%). Isolated PACs, couplets, occasional runs of atach longest 28 beats  Rare ventricular ectopy (<1%), one 6 beat run of NSVT  Symptoms correlated with sinus rhythm  Assessment and Plan  1. Chest pain - no recent symptoms, continue to monitor  2. Palpitations - doing well, continue beta blocker - EKG today shows NSR.   3. Bicuspid aortic valve - noted by recent echo. Very mild aortic stenosis - needs CTA for screening of possible coexisting aortopathy  4. HTN - at goal, continue current meds  5. Hyperlipidemia - continue repatha, labs followed by pcp      [name], M.D.

## 2019-03-20 NOTE — Patient Instructions (Signed)
Medication Instructions:  Your physician recommends that you continue on your current medications as directed. Please refer to the Current Medication list given to you today.   Labwork: none  Testing/Procedures: Non-Cardiac CT Angiography (CTA), is a special type of CT scan that uses a computer to produce multi-dimensional views of major blood vessels throughout the body. In CT angiography, a contrast material is injected through an IV to help visualize the blood vessels   Follow-Up: Your physician wants you to follow-up in: 6 months . You will receive a reminder letter in the mail two months in advance. If you don't receive a letter, please call our office to schedule the follow-up appointment.   Any Other Special Instructions Will Be Listed Below (If Applicable).     If you need a refill on your cardiac medications before your next appointment, please call your pharmacy.

## 2019-03-27 DIAGNOSIS — G894 Chronic pain syndrome: Secondary | ICD-10-CM | POA: Diagnosis not present

## 2019-03-27 DIAGNOSIS — E063 Autoimmune thyroiditis: Secondary | ICD-10-CM | POA: Diagnosis not present

## 2019-03-27 DIAGNOSIS — E7849 Other hyperlipidemia: Secondary | ICD-10-CM | POA: Diagnosis not present

## 2019-03-27 DIAGNOSIS — I10 Essential (primary) hypertension: Secondary | ICD-10-CM | POA: Diagnosis not present

## 2019-04-09 ENCOUNTER — Telehealth: Payer: Self-pay

## 2019-04-09 DIAGNOSIS — L209 Atopic dermatitis, unspecified: Secondary | ICD-10-CM | POA: Diagnosis not present

## 2019-04-09 DIAGNOSIS — Z79899 Other long term (current) drug therapy: Secondary | ICD-10-CM

## 2019-04-09 NOTE — Telephone Encounter (Signed)
-----   Message from Holley Dexter sent at 04/09/2019  2:28 PM EDT ----- Regarding: CT/[facility] CT 04-22-19 @8 :00am/[place] Need lab orders BUN/Creat.  Thanks renee

## 2019-04-09 NOTE — Telephone Encounter (Signed)
Order for BMET placed.  

## 2019-04-22 ENCOUNTER — Ambulatory Visit (HOSPITAL_COMMUNITY)
Admission: RE | Admit: 2019-04-22 | Discharge: 2019-04-22 | Disposition: A | Discharge status: Home / Self Care | Payer: PPO | Source: Ambulatory Visit | Attending: Cardiology | Admitting: Cardiology

## 2019-04-22 ENCOUNTER — Other Ambulatory Visit: Payer: Self-pay

## 2019-04-22 DIAGNOSIS — Q231 Congenital insufficiency of aortic valve: Secondary | ICD-10-CM | POA: Diagnosis not present

## 2019-04-22 DIAGNOSIS — E0500 Thyrotoxicosis with diffuse goiter without thyrotoxic crisis or storm: Secondary | ICD-10-CM | POA: Diagnosis not present

## 2019-04-22 DIAGNOSIS — J849 Interstitial pulmonary disease, unspecified: Secondary | ICD-10-CM | POA: Diagnosis not present

## 2019-04-22 DIAGNOSIS — I10 Essential (primary) hypertension: Secondary | ICD-10-CM | POA: Diagnosis not present

## 2019-04-22 DIAGNOSIS — I2510 Atherosclerotic heart disease of native coronary artery without angina pectoris: Secondary | ICD-10-CM | POA: Diagnosis not present

## 2019-04-22 DIAGNOSIS — M1991 Primary osteoarthritis, unspecified site: Secondary | ICD-10-CM | POA: Diagnosis not present

## 2019-04-22 DIAGNOSIS — K429 Umbilical hernia without obstruction or gangrene: Secondary | ICD-10-CM | POA: Diagnosis not present

## 2019-04-22 LAB — POCT I-STAT CREATININE: Creatinine, Ser: 1.3 mg/dL — ABNORMAL HIGH (ref 0.44–1.00)

## 2019-04-22 MED ORDER — IOHEXOL 350 MG/ML SOLN
100.0000 mL | Freq: Once | INTRAVENOUS | Status: AC | PRN
  Administered 2019-04-22: 80 mL via INTRAVENOUS

## 2019-04-23 DIAGNOSIS — G894 Chronic pain syndrome: Secondary | ICD-10-CM | POA: Diagnosis not present

## 2019-04-23 DIAGNOSIS — Z6822 Body mass index (BMI) 22.0-22.9, adult: Secondary | ICD-10-CM | POA: Diagnosis not present

## 2019-04-23 DIAGNOSIS — I10 Essential (primary) hypertension: Secondary | ICD-10-CM | POA: Diagnosis not present

## 2019-05-08 DIAGNOSIS — Z13220 Encounter for screening for lipoid disorders: Secondary | ICD-10-CM | POA: Diagnosis not present

## 2019-05-11 ENCOUNTER — Other Ambulatory Visit: Payer: Self-pay

## 2019-05-11 ENCOUNTER — Other Ambulatory Visit (HOSPITAL_COMMUNITY)
Admission: RE | Admit: 2019-05-11 | Discharge: 2019-05-11 | Disposition: A | Discharge status: Home / Self Care | Payer: PPO | Source: Ambulatory Visit | Attending: Pulmonary Disease | Admitting: Pulmonary Disease

## 2019-05-11 DIAGNOSIS — Z20822 Contact with and (suspected) exposure to covid-19: Secondary | ICD-10-CM | POA: Insufficient documentation

## 2019-05-11 DIAGNOSIS — Z01812 Encounter for preprocedural laboratory examination: Secondary | ICD-10-CM | POA: Diagnosis not present

## 2019-05-12 LAB — SARS CORONAVIRUS 2 (TAT 6-24 HRS): SARS Coronavirus 2: NEGATIVE

## 2019-05-14 ENCOUNTER — Other Ambulatory Visit: Payer: Self-pay

## 2019-05-14 ENCOUNTER — Ambulatory Visit (INDEPENDENT_AMBULATORY_CARE_PROVIDER_SITE_OTHER): Payer: PPO | Admitting: Pulmonary Disease

## 2019-05-14 DIAGNOSIS — J849 Interstitial pulmonary disease, unspecified: Secondary | ICD-10-CM

## 2019-05-14 LAB — PULMONARY FUNCTION TEST
DL/VA % pred: 70 %
DL/VA: 2.98 ml/min/mmHg/L
DLCO cor % pred: 55 %
DLCO cor: 10.19 ml/min/mmHg
DLCO unc % pred: 55 %
DLCO unc: 10.19 ml/min/mmHg
FEF 25-75 Post: 3.77 L/sec
FEF 25-75 Pre: 3.15 L/sec
FEF2575-%Change-Post: 19 %
FEF2575-%Pred-Post: 191 %
FEF2575-%Pred-Pre: 159 %
FEV1-%Change-Post: 4 %
FEV1-%Pred-Post: 93 %
FEV1-%Pred-Pre: 89 %
FEV1-Post: 2.06 L
FEV1-Pre: 1.96 L
FEV1FVC-%Change-Post: 0 %
FEV1FVC-%Pred-Pre: 118 %
FEV6-%Change-Post: 4 %
FEV6-%Pred-Post: 81 %
FEV6-%Pred-Pre: 77 %
FEV6-Post: 2.25 L
FEV6-Pre: 2.16 L
FEV6FVC-%Pred-Post: 104 %
FEV6FVC-%Pred-Pre: 104 %
FVC-%Change-Post: 4 %
FVC-%Pred-Post: 78 %
FVC-%Pred-Pre: 74 %
FVC-Post: 2.25 L
FVC-Pre: 2.16 L
Post FEV1/FVC ratio: 91 %
Post FEV6/FVC ratio: 100 %
Pre FEV1/FVC ratio: 91 %
Pre FEV6/FVC Ratio: 100 %
RV % pred: 66 %
RV: 1.33 L
TLC % pred: 73 %
TLC: 3.48 L

## 2019-05-14 NOTE — Progress Notes (Signed)
PFT done today. 

## 2019-05-19 ENCOUNTER — Ambulatory Visit: Payer: PPO | Admitting: Pulmonary Disease

## 2019-05-19 ENCOUNTER — Ambulatory Visit (INDEPENDENT_AMBULATORY_CARE_PROVIDER_SITE_OTHER): Payer: PPO

## 2019-05-19 ENCOUNTER — Other Ambulatory Visit: Payer: Self-pay

## 2019-05-19 ENCOUNTER — Encounter: Payer: Self-pay | Admitting: Pulmonary Disease

## 2019-05-19 DIAGNOSIS — J849 Interstitial pulmonary disease, unspecified: Secondary | ICD-10-CM

## 2019-05-19 MED ORDER — BENZONATATE 200 MG PO CAPS: 200.0000 mg | ORAL_CAPSULE | Freq: Three times a day (TID) | ORAL | 1 refills | Status: DC | PRN

## 2019-05-19 MED ORDER — BUDESONIDE-FORMOTEROL FUMARATE 160-4.5 MCG/ACT IN AERO: 2.0000 | INHALATION_SPRAY | Freq: Two times a day (BID) | RESPIRATORY_TRACT | 5 refills | Status: DC

## 2019-05-19 NOTE — Progress Notes (Addendum)
Samantha Osborne    [id number]    Jun 18, 1955  Primary Care Physician:Fusco, Purcell Nails, MD  Referring Physician: Redmond School, [address] [place],  [place] [address]  Chief complaint: Follow up interstitial lung disease  HPI: 64 year old with history of hypertension, rheumatoid arthritis, DVT Referred for evaluation of abnormal CT scan  CT scan done in June 2024 atypical chest pain.  Noted to have diffuse interstitial lung disease as noted below, atypical for UIP. Complains of chronic dyspnea exertion.  No cough, sputum production, fevers, chills Has small joint pain of the hands, morning stiffness, transient rash at the bottom of the legs.  Denies any difficulty swallowing.  Has history of obstructive sleep apnea diagnosed and 2017 but is noncompliant with CPAP.  Was diagnosed with ?rheumatoid arthritis in 2019 by Dr. Trudie Reed but has not followed up since or received any treatment for this.  Pets: Cat, no birds Occupation: Copywriter, advertising Exposures, interstitial lung disease questionarre  07/29/2018-Possible mold exposure at home.  No hot tubs, Jacuzzi.  She had received a dose of nitrofurantoin over 5 years ago Smoking history: 50-pack-year smoker.  Continues to smoke 1 pack/day Travel history: No significant problems history Relevant family history: No significant history of lung disease.  Sister has symptoms of rheumatoid arthritis as well.  Interim history: Continues to have dyspnea with chest congestion and cough. We have referred her again to Dr. Trudie Reed for positive ANA.  She saw her in late 2020 and was apparently told she does not have any evidence of autoimmune disease but has osteoarthritis.  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  Outpatient Encounter Medications as of 05/19/2019  Medication Sig  . amLODipine (NORVASC) 5 MG tablet Take 5 mg by mouth daily.  Marland Kitchen aspirin EC 81 MG tablet Take 81 mg  by mouth daily.  Marland Kitchen buPROPion (WELLBUTRIN SR) 150 MG 12 hr tablet Take 1 tablet by mouth daily.  . butalbital-acetaminophen-caffeine (FIORICET, ESGIC) 50-325-40 MG tablet   . Estradiol (ESTRACE PO) Take by mouth.  . Evolocumab with Infusor ([place]) 420 MG/3.5ML SOCT Inject into the skin.  Marland Kitchen HYDROcodone-acetaminophen (NORCO) 10-325 MG per tablet Take 1 tablet by mouth every 4 (four) hours as needed. Reported on 07/05/2015  . levothyroxine (SYNTHROID) 112 MCG tablet Take 112 mcg by mouth daily before breakfast. Takes 112 mcg one day then adds 25 mcg the next  . levothyroxine (SYNTHROID) 25 MCG tablet Take 1 tablet by mouth daily.  Marland Kitchen losartan (COZAAR) 100 MG tablet Take 1 tablet by mouth daily.  . methocarbamol (ROBAXIN) 500 MG tablet Take 1 tablet (500 mg total) by mouth 4 (four) times daily.  Marland Kitchen oxyCODONE (OXY IR/ROXICODONE) 5 MG immediate release tablet 15 mg.   . [DISCONTINUED] cholecalciferol (VITAMIN D) 1000 units tablet Take 1,000 Units by mouth daily.  . [DISCONTINUED] metoprolol tartrate (LOPRESSOR) 25 MG tablet Take 1 tablet (25 mg total) by mouth 2 (two) times daily. (Patient not taking: Reported on 05/19/2019)  . [DISCONTINUED] Omega-3 Fatty Acids (FISH OIL) 1000 MG CAPS Take 1 capsule by mouth daily.    No facility-administered encounter medications on file as of 05/19/2019.   Physical Exam: Blood pressure 130/78, pulse 65, height 6\' 1"  (1.854 m), weight 126 lb (57.2 kg), SpO2 96 %. Gen:      No acute distress HEENT:  EOMI, sclera anicteric Neck:     No masses; no thyromegaly Lungs:  Clear to auscultation bilaterally; normal respiratory effort CV:         Regular rate and rhythm; no murmurs Abd:      + bowel sounds; soft, non-tender; no palpable masses, no distension Ext:    No edema; adequate peripheral perfusion Skin:      Warm and dry; no rash Neuro: alert and oriented x 3 Psych: normal mood and affect  Data Reviewed: Imaging: CT chest 06/25/2018- enlarged  mediastinal lymph nodes.  Diffuse groundglass opacities, mild traction bronchiectasis without basilar gradient.  Indeterminate for UIP.    CTA 04/12/2019-diffuse groundglass opacities with mild traction bronchiectasis.  Emphysema. I have reviewed the images personally.  Labs: ANA 07/29/2018-1:320, nucleolar, homogeneous Negative myositis, hypersensitivity panel, SCL 70 and Sjogren's antibodies.  PFTs: 05/14/2019 FVC 2.25 [90%], FEV1 2.06 [92%], F/F 91, TLC 3.45 [73%], DLCO 10.19 [55%] Mild restriction with moderate diffusion defect.  Slightly worse compared to 2020.  6-minute walk test 05/19/2019-476 m, nadir O2 sat 96%.  Assessment:  Follow-up for interstitial lung disease CT reviewed with nonspecific pattern of fibrosis, groundglass attenuation.  May have IPAF given elevated ANA but had a rheumatology evaluation and was told there is no evidence of autoimmune condition.  We will get records from Dr. Trudie Reed for review.  Has some mold exposure last year but house was clean and there is no ongoing issues.  Repeat high-res CT for evaluation of the lung. Discussed further work-up including surgical lung biopsy but patient does not want to get that time.  She is agreeable for a bronchoscope with lavage, possible transbronchial biopsies based on rheumatology records and CT.  Emphysema No obstruction noted on PFTs Trial of Symbicort, Tessalon as she has cough, congestion.  Plan/Recommendations: - Obtain rheumatology results - High-res CT  [name] MD [facility] 05/19/2019, 10:41 AM  Addendum: Received note from Dr. Trudie Reed 03/21/2020 She has a diagnosis of osteoarthritis of the hands in general osteoarthritis.  No evidence of connective tissue disease  Joint symptoms are stable.  Rheumatology has signed off.  Recs She did not get high-res CT as planned above.  We will get in touch with patient and see if she can get the scan and follow back in the  clinic  [name] MD [facility] See Amion for pager  If no response to pager , please call [phone] until 7pm After 7:00 pm call Elink  [phone] 03/29/2020, 9:49 AM  CC: Redmond School, MD

## 2019-05-19 NOTE — Patient Instructions (Signed)
We will start you on Symbicort 160 and Tessalon Perles for cough We will get records from Dr. Gavin Pound review her evaluation of positive ANA We will schedule you for high-resolution CT Based on these results we may need to do a procedure called bronchoscope. Follow-up in 1 to 2 months.

## 2019-05-20 DIAGNOSIS — I10 Essential (primary) hypertension: Secondary | ICD-10-CM | POA: Diagnosis not present

## 2019-05-20 DIAGNOSIS — E271 Primary adrenocortical insufficiency: Secondary | ICD-10-CM | POA: Diagnosis not present

## 2019-05-20 DIAGNOSIS — G894 Chronic pain syndrome: Secondary | ICD-10-CM | POA: Diagnosis not present

## 2019-05-20 DIAGNOSIS — E063 Autoimmune thyroiditis: Secondary | ICD-10-CM | POA: Diagnosis not present

## 2019-05-22 DIAGNOSIS — Z720 Tobacco use: Secondary | ICD-10-CM | POA: Diagnosis not present

## 2019-05-22 DIAGNOSIS — E0500 Thyrotoxicosis with diffuse goiter without thyrotoxic crisis or storm: Secondary | ICD-10-CM | POA: Diagnosis not present

## 2019-05-22 DIAGNOSIS — J849 Interstitial pulmonary disease, unspecified: Secondary | ICD-10-CM | POA: Diagnosis not present

## 2019-05-22 DIAGNOSIS — I10 Essential (primary) hypertension: Secondary | ICD-10-CM | POA: Diagnosis not present

## 2019-05-25 DIAGNOSIS — M75102 Unspecified rotator cuff tear or rupture of left shoulder, not specified as traumatic: Secondary | ICD-10-CM | POA: Diagnosis not present

## 2019-05-25 DIAGNOSIS — Z6822 Body mass index (BMI) 22.0-22.9, adult: Secondary | ICD-10-CM | POA: Diagnosis not present

## 2019-06-16 ENCOUNTER — Ambulatory Visit (HOSPITAL_COMMUNITY): Payer: PPO

## 2019-06-18 ENCOUNTER — Ambulatory Visit: Payer: PPO | Admitting: Pulmonary Disease

## 2019-06-22 DIAGNOSIS — I10 Essential (primary) hypertension: Secondary | ICD-10-CM | POA: Diagnosis not present

## 2019-06-22 DIAGNOSIS — M1991 Primary osteoarthritis, unspecified site: Secondary | ICD-10-CM | POA: Diagnosis not present

## 2019-06-22 DIAGNOSIS — E0500 Thyrotoxicosis with diffuse goiter without thyrotoxic crisis or storm: Secondary | ICD-10-CM | POA: Diagnosis not present

## 2019-06-22 DIAGNOSIS — J849 Interstitial pulmonary disease, unspecified: Secondary | ICD-10-CM | POA: Diagnosis not present

## 2019-06-23 DIAGNOSIS — G43909 Migraine, unspecified, not intractable, without status migrainosus: Secondary | ICD-10-CM | POA: Diagnosis not present

## 2019-06-23 DIAGNOSIS — G894 Chronic pain syndrome: Secondary | ICD-10-CM | POA: Diagnosis not present

## 2019-06-23 DIAGNOSIS — Z6822 Body mass index (BMI) 22.0-22.9, adult: Secondary | ICD-10-CM | POA: Diagnosis not present

## 2019-06-23 DIAGNOSIS — M1991 Primary osteoarthritis, unspecified site: Secondary | ICD-10-CM | POA: Diagnosis not present

## 2019-06-23 DIAGNOSIS — G4700 Insomnia, unspecified: Secondary | ICD-10-CM | POA: Diagnosis not present

## 2019-07-22 DIAGNOSIS — I10 Essential (primary) hypertension: Secondary | ICD-10-CM | POA: Diagnosis not present

## 2019-07-22 DIAGNOSIS — J849 Interstitial pulmonary disease, unspecified: Secondary | ICD-10-CM | POA: Diagnosis not present

## 2019-07-22 DIAGNOSIS — M1991 Primary osteoarthritis, unspecified site: Secondary | ICD-10-CM | POA: Diagnosis not present

## 2019-07-22 DIAGNOSIS — E0500 Thyrotoxicosis with diffuse goiter without thyrotoxic crisis or storm: Secondary | ICD-10-CM | POA: Diagnosis not present

## 2019-07-23 DIAGNOSIS — K219 Gastro-esophageal reflux disease without esophagitis: Secondary | ICD-10-CM | POA: Diagnosis not present

## 2019-07-23 DIAGNOSIS — E782 Mixed hyperlipidemia: Secondary | ICD-10-CM | POA: Diagnosis not present

## 2019-07-23 DIAGNOSIS — M159 Polyosteoarthritis, unspecified: Secondary | ICD-10-CM | POA: Diagnosis not present

## 2019-07-23 DIAGNOSIS — Z6822 Body mass index (BMI) 22.0-22.9, adult: Secondary | ICD-10-CM | POA: Diagnosis not present

## 2019-07-23 DIAGNOSIS — I10 Essential (primary) hypertension: Secondary | ICD-10-CM | POA: Diagnosis not present

## 2019-07-23 DIAGNOSIS — G4733 Obstructive sleep apnea (adult) (pediatric): Secondary | ICD-10-CM | POA: Diagnosis not present

## 2019-07-23 DIAGNOSIS — G894 Chronic pain syndrome: Secondary | ICD-10-CM | POA: Diagnosis not present

## 2019-07-23 DIAGNOSIS — E063 Autoimmune thyroiditis: Secondary | ICD-10-CM | POA: Diagnosis not present

## 2019-08-21 DIAGNOSIS — I10 Essential (primary) hypertension: Secondary | ICD-10-CM | POA: Diagnosis not present

## 2019-08-21 DIAGNOSIS — J849 Interstitial pulmonary disease, unspecified: Secondary | ICD-10-CM | POA: Diagnosis not present

## 2019-08-21 DIAGNOSIS — M1991 Primary osteoarthritis, unspecified site: Secondary | ICD-10-CM | POA: Diagnosis not present

## 2019-08-21 DIAGNOSIS — E0500 Thyrotoxicosis with diffuse goiter without thyrotoxic crisis or storm: Secondary | ICD-10-CM | POA: Diagnosis not present

## 2019-08-24 DIAGNOSIS — Z6822 Body mass index (BMI) 22.0-22.9, adult: Secondary | ICD-10-CM | POA: Diagnosis not present

## 2019-08-24 DIAGNOSIS — Z789 Other specified health status: Secondary | ICD-10-CM | POA: Diagnosis not present

## 2019-08-24 DIAGNOSIS — I739 Peripheral vascular disease, unspecified: Secondary | ICD-10-CM | POA: Diagnosis not present

## 2019-08-24 DIAGNOSIS — I10 Essential (primary) hypertension: Secondary | ICD-10-CM | POA: Diagnosis not present

## 2019-08-24 DIAGNOSIS — G894 Chronic pain syndrome: Secondary | ICD-10-CM | POA: Diagnosis not present

## 2019-08-24 DIAGNOSIS — M1991 Primary osteoarthritis, unspecified site: Secondary | ICD-10-CM | POA: Diagnosis not present

## 2019-09-16 DIAGNOSIS — M150 Primary generalized (osteo)arthritis: Secondary | ICD-10-CM | POA: Diagnosis not present

## 2019-09-16 DIAGNOSIS — M154 Erosive (osteo)arthritis: Secondary | ICD-10-CM | POA: Diagnosis not present

## 2019-09-16 DIAGNOSIS — M545 Low back pain: Secondary | ICD-10-CM | POA: Diagnosis not present

## 2019-09-16 DIAGNOSIS — M2550 Pain in unspecified joint: Secondary | ICD-10-CM | POA: Diagnosis not present

## 2019-09-16 DIAGNOSIS — J849 Interstitial pulmonary disease, unspecified: Secondary | ICD-10-CM | POA: Diagnosis not present

## 2019-09-17 ENCOUNTER — Encounter: Payer: Self-pay | Admitting: Cardiology

## 2019-09-17 ENCOUNTER — Telehealth (INDEPENDENT_AMBULATORY_CARE_PROVIDER_SITE_OTHER): Payer: Medicare Other | Admitting: Cardiology

## 2019-09-17 ENCOUNTER — Other Ambulatory Visit: Payer: Self-pay

## 2019-09-17 VITALS — BP 130/80 | Ht 63.0 in | Wt 124.0 lb

## 2019-09-17 DIAGNOSIS — E782 Mixed hyperlipidemia: Secondary | ICD-10-CM

## 2019-09-17 DIAGNOSIS — I10 Essential (primary) hypertension: Secondary | ICD-10-CM | POA: Diagnosis not present

## 2019-09-17 DIAGNOSIS — R002 Palpitations: Secondary | ICD-10-CM

## 2019-09-17 DIAGNOSIS — Q231 Congenital insufficiency of aortic valve: Secondary | ICD-10-CM

## 2019-09-17 NOTE — Progress Notes (Signed)
Virtual Visit via Video Note   This visit type was conducted due to national recommendations for restrictions regarding the COVID-19 Pandemic (e.g. social distancing) in an effort to limit this patient's exposure and mitigate transmission in our community.  Due to her co-morbid illnesses, this patient is at least at moderate risk for complications without adequate follow up.  This format is felt to be most appropriate for this patient at this time.  All issues noted in this document were discussed and addressed.  A limited physical exam was performed with this format.  Please refer to the patient's chart for her consent to telehealth for [facility].       Date:  09/17/2019   ID:  Samantha Osborne, DOB 1955-11-24, MRN [id number] The patient was identified using 2 identifiers.  Patient Location: Home Provider Location: Office/Clinic  PCP:  Redmond School, MD  Cardiologist:  Carlyle Dolly, MD  Electrophysiologist:  None   Evaluation Performed:  Follow-Up Visit  Chief Complaint:  Follow up  History of Present Illness:    Samantha Osborne is a 64 y.o. female seen today for follow up of the following medical problems.   1.Prior history of chest pain - admit [facility] 02/2015. Negative workup for ACS, echo showed LVEF 85-02%, grade I diastolic dysfunction, no WMAs - she was referred for outpatient stress test. Nuclear stress 03/2015 showed no ischemia, LVEF 30-44%  - no recent chest pains   2. Carotid stenosis - followed by vascular - 07/7410 RIC 87-86, LICA 76-72  - upcoming appt in Sept w/ vascular  3. Hyperlipidemia - statin intolerance.She reports intolerance to zetia as well  - taking repatha, tolerating well  4. Palpitations - high stress at home caring for her elderly father - started about 2-3 weeks ago. Fluttering midchest,shelistened to her heart with a stethoscopeand soundedlike irregular beats - can last several hours. +fatigue - 1 cup of  coffee in AM, noncaffeinated. No EtoH - symptoms occur few times a week.  - event monitor with just occasional PACs, short run NSVT  - no recent palpitations  5. ILD - followed by pulmonary   6. HTN - compliant with meds  7. Bicuspid AV - noted by 02/2018 echo, very mild stenosis - she reports her mother had issues with her aortic valve and aortic aneurysm in her 41s, unclear if she may have had a bicuspid valve.   - 03/2019 CTA normal aorta   SH: father passed recently at 67 Has had covid vaccine.     The patient does not have symptoms concerning for COVID-19 infection (fever, chills, cough, or new shortness of breath).    Past Medical History:  Diagnosis Date  . Carotid artery occlusion   . DDD (degenerative disc disease)   . Depression   . DVT (deep venous thrombosis) ([place]) 2008   right leg  . Hypertension   . Thyroid disease    Past Surgical History:  Procedure Laterality Date  . CESAREAN SECTION    . DILATION AND CURETTAGE OF UTERUS    . SHOULDER ARTHROSCOPY Left 2005     No outpatient medications have been marked as taking for the 09/17/19 encounter (Appointment) with [name], MD.     Allergies:   Patient has no known allergies.   Social History   Tobacco Use  . Smoking status: Current Every Day Smoker    Packs/day: 2.00    Types: Cigarettes    Start date: 03/29/1970  . Smokeless tobacco:  Never Used  . Tobacco comment: Less than 1 pk per day  Vaping Use  . Vaping Use: Never used  Substance Use Topics  . Alcohol use: No    Alcohol/week: 0.0 standard drinks  . Drug use: No     Family Hx: The patient's family history includes AAA (abdominal aortic aneurysm) in her mother; Cancer in her father; Heart attack in her mother; Heart disease in her brother and mother; Hyperlipidemia in her mother; Hypertension in her brother and son.  ROS:   Please see the history of present illness.     All other systems reviewed and are  negative.   Prior CV studies:   The following studies were reviewed today:  02/2015 echo Study Conclusions  - Left ventricle: The cavity size was normal. Wall thickness was increased in a pattern of mild LVH. Systolic function was normal. The estimated ejection fraction was in the range of 60% to 65%. Wall motion was normal; there were no regional wall motion abnormalities. Doppler parameters are consistent with abnormal left ventricular relaxation (grade 1 diastolic dysfunction). - Aortic valve: Mildly to moderately calcified annulus. Mildly thickened leaflets. - Mitral valve: Calcified annulus. Normal thickness leaflets . - Tricuspid valve: There was mild regurgitation.  03/2015 Nuclear stress test  There was no ST segment deviation noted during stress.  The study is normal. There are no perfusion defects consistent with prior infarct or current ischemia.  This is an intermediate risk study. Intermediate risk based on low calculated LVEF. Visually the LVEF looks normal, consider correlation with echo. There is no myocardium at jeopardy.  The left ventricular ejection fraction is moderately decreased (30-44%).   02/2018 14 day event monitor  14 day event monitor  Min HR 49, Max HR 170, Avg HR 74  Rare supraventricular ectopy (<1%). Isolated PACs, couplets, occasional runs of atach longest 28 beats  Rare ventricular ectopy (<1%), one 6 beat run of NSVT  Symptoms correlated with sinus rhythm   03/2019 CTA chest  IMPRESSION: Negative for aortic dissection or aneurysm. No acute abnormality chest, abdomen or pelvis.  Extensive calcific atherosclerosis including involvement of the coronary arteries.  No change in abnormal appearance of the lungs consistent with interstitial lung disease such as NSIP. Labs/Other Tests and Data Reviewed:    EKG:  No ECG reviewed.  Recent Labs: 04/22/2019: Creatinine, Ser 1.30   Recent Lipid Panel No results found  for: CHOL, TRIG, HDL, CHOLHDL, LDLCALC, LDLDIRECT  Wt Readings from Last 3 Encounters:  05/19/19 126 lb (57.2 kg)  03/20/19 128 lb (58.1 kg)  09/10/18 126 lb (57.2 kg)     Objective:    Vital Signs:   Today's Vitals   09/17/19 1358  BP: 130/80  Weight: 124 lb (56.2 kg)  Height: 5\' 3"  (1.6 m)   Body mass index is 21.97 kg/m. Normal affect. Normal speech pattern and tone. Comfortable, no apparent distress. No audible signs of sob or wheezing.   ASSESSMENT & PLAN:    1.Chest pain - denies any recent symptoms, continue to monitor.   2. Palpitations - no recent symptoms, continue beta blocker  3. Bicuspid aortic valve - noted by recent echo. Very mild aortic stenosis - CTA without signs of aortopathy - continue to monitor at this time.   4. HTN - she is at goal, continue current meds  5. Hyperlipidemia - request pcp labs, continue repatha  COVID-19 Education: The signs and symptoms of COVID-19 were discussed with the patient and how to seek  care for testing (follow up with PCP or arrange E-visit).  The importance of social distancing was discussed today.  Time:   Today, I have spent 14 minutes with the patient with telehealth technology discussing the above problems.     Medication Adjustments/Labs and Tests Ordered: Current medicines are reviewed at length with the patient today.  Concerns regarding medicines are outlined above.   Tests Ordered: No orders of the defined types were placed in this encounter.   Medication Changes: No orders of the defined types were placed in this encounter.   Follow Up:  In Person in 6 month(s)  Signed, Carlyle Dolly, MD  09/17/2019 1:20 PM    [facility]

## 2019-09-22 ENCOUNTER — Other Ambulatory Visit: Payer: Self-pay | Admitting: *Deleted

## 2019-09-22 DIAGNOSIS — I10 Essential (primary) hypertension: Secondary | ICD-10-CM | POA: Diagnosis not present

## 2019-09-22 DIAGNOSIS — M1991 Primary osteoarthritis, unspecified site: Secondary | ICD-10-CM | POA: Diagnosis not present

## 2019-09-22 DIAGNOSIS — E0500 Thyrotoxicosis with diffuse goiter without thyrotoxic crisis or storm: Secondary | ICD-10-CM | POA: Diagnosis not present

## 2019-09-22 DIAGNOSIS — J849 Interstitial pulmonary disease, unspecified: Secondary | ICD-10-CM | POA: Diagnosis not present

## 2019-09-22 DIAGNOSIS — I6523 Occlusion and stenosis of bilateral carotid arteries: Secondary | ICD-10-CM

## 2019-09-24 DIAGNOSIS — Z6822 Body mass index (BMI) 22.0-22.9, adult: Secondary | ICD-10-CM | POA: Diagnosis not present

## 2019-09-24 DIAGNOSIS — G894 Chronic pain syndrome: Secondary | ICD-10-CM | POA: Diagnosis not present

## 2019-09-24 DIAGNOSIS — E0500 Thyrotoxicosis with diffuse goiter without thyrotoxic crisis or storm: Secondary | ICD-10-CM | POA: Diagnosis not present

## 2019-09-24 DIAGNOSIS — E271 Primary adrenocortical insufficiency: Secondary | ICD-10-CM | POA: Diagnosis not present

## 2019-09-24 DIAGNOSIS — I10 Essential (primary) hypertension: Secondary | ICD-10-CM | POA: Diagnosis not present

## 2019-10-01 ENCOUNTER — Other Ambulatory Visit: Payer: Self-pay

## 2019-10-01 ENCOUNTER — Ambulatory Visit (HOSPITAL_COMMUNITY)
Admission: RE | Admit: 2019-10-01 | Discharge: 2019-10-01 | Disposition: A | Discharge status: Home / Self Care | Payer: Medicare Other | Source: Ambulatory Visit | Attending: Vascular Surgery | Admitting: Vascular Surgery

## 2019-10-01 ENCOUNTER — Ambulatory Visit (INDEPENDENT_AMBULATORY_CARE_PROVIDER_SITE_OTHER): Payer: Medicare Other | Admitting: Physician Assistant

## 2019-10-01 VITALS — BP 165/95 | HR 73 | Temp 98.8°F | Resp 20 | Ht 63.0 in | Wt 127.6 lb

## 2019-10-01 DIAGNOSIS — I6523 Occlusion and stenosis of bilateral carotid arteries: Secondary | ICD-10-CM | POA: Insufficient documentation

## 2019-10-01 NOTE — Progress Notes (Signed)
    Virtual Visit via Telephone Note  I connected with Samantha Osborne on 10/01/2019 using the [email or web address] by telephone and verified that I was speaking with the correct person using two identifiers. Patient was located at home. I am located at [facility] office.   The limitations of evaluation and management by telemedicine and the availability of in person appointments have been previously discussed with the patient and are documented in the patients chart. The patient expressed understanding and consented to proceed.  PCP: Redmond School, MD  History of Present Illness: Samantha Osborne is a 64 y.o. female who presents for re-evaluation of carotid artery stenosis.  She has no history of CVA or TIA.  She denies any stroke like symptoms including slurring speech, changes in vision, or one sided weakness.  She is a current tobacco smoker.  She is taking a daily aspirin.  Past Medical History:  Diagnosis Date  . Carotid artery occlusion   . DDD (degenerative disc disease)   . Depression   . DVT (deep venous thrombosis) ([place]) 2008   right leg  . Hypertension   . Thyroid disease     Past Surgical History:  Procedure Laterality Date  . CESAREAN SECTION    . DILATION AND CURETTAGE OF UTERUS    . SHOULDER ARTHROSCOPY Left 2005    Current Meds  Medication Sig  . amLODipine (NORVASC) 5 MG tablet Take 5 mg by mouth daily.  Marland Kitchen aspirin EC 81 MG tablet Take 81 mg by mouth daily.  . budesonide-formoterol (SYMBICORT) 160-4.5 MCG/ACT inhaler Inhale 2 puffs into the lungs in the morning and at bedtime.  Marland Kitchen buPROPion (WELLBUTRIN SR) 150 MG 12 hr tablet Take 1 tablet by mouth daily.  . butalbital-acetaminophen-caffeine (FIORICET, ESGIC) 50-325-40 MG tablet   . celecoxib (CELEBREX) 100 MG capsule Take 100 mg by mouth 2 (two) times daily.  . Estradiol (ESTRACE PO) Take by mouth.  . Evolocumab with Infusor ([place]) 420 MG/3.5ML SOCT Inject into the skin.  Marland Kitchen HYDROcodone-acetaminophen (NORCO)  10-325 MG per tablet Take 1 tablet by mouth every 4 (four) hours as needed. Reported on 07/05/2015  . levothyroxine (SYNTHROID) 75 MCG tablet Take 75 mcg by mouth daily.  . methocarbamol (ROBAXIN) 500 MG tablet Take 1 tablet (500 mg total) by mouth 4 (four) times daily.  . metoprolol tartrate (LOPRESSOR) 25 MG tablet Take 25 mg by mouth 2 (two) times daily.  Marland Kitchen oxyCODONE (OXY IR/ROXICODONE) 5 MG immediate release tablet 15 mg.     12 system ROS was negative unless otherwise noted in HPI   Observations/Objective: R ICA 40-59% stenosis L ICA 60-79% stenosis  Assessment and Plan: Carotid duplex findings unchanged compared to study from 1 year ago No indication for revascularization of L ICA Continue daily aspirin Encouraged smoking cessation Recheck carotid duplex in 6 months  Follow Up Instructions:   Follow up in 6 month(s)   I discussed the assessment and treatment plan with the patient. The patient was provided an opportunity to ask questions and all were answered. The patient agreed with the plan and demonstrated an understanding of the instructions.   The patient was advised to call back or seek an in-person evaluation if the symptoms worsen or if the condition fails to improve as anticipated.  I spent 10 minutes with the patient via telephone encounter.   Signed, Dagoberto Ligas [facility] Office: [phone]  10/01/2019, 12:58 PM

## 2019-10-06 ENCOUNTER — Other Ambulatory Visit: Payer: Self-pay | Admitting: *Deleted

## 2019-10-06 DIAGNOSIS — I6523 Occlusion and stenosis of bilateral carotid arteries: Secondary | ICD-10-CM

## 2019-10-13 DIAGNOSIS — N342 Other urethritis: Secondary | ICD-10-CM | POA: Diagnosis not present

## 2019-10-13 DIAGNOSIS — Z6822 Body mass index (BMI) 22.0-22.9, adult: Secondary | ICD-10-CM | POA: Diagnosis not present

## 2019-10-22 DIAGNOSIS — J849 Interstitial pulmonary disease, unspecified: Secondary | ICD-10-CM | POA: Diagnosis not present

## 2019-10-22 DIAGNOSIS — E0500 Thyrotoxicosis with diffuse goiter without thyrotoxic crisis or storm: Secondary | ICD-10-CM | POA: Diagnosis not present

## 2019-10-22 DIAGNOSIS — Z720 Tobacco use: Secondary | ICD-10-CM | POA: Diagnosis not present

## 2019-10-22 DIAGNOSIS — M1991 Primary osteoarthritis, unspecified site: Secondary | ICD-10-CM | POA: Diagnosis not present

## 2019-10-23 DIAGNOSIS — I10 Essential (primary) hypertension: Secondary | ICD-10-CM | POA: Diagnosis not present

## 2019-10-23 DIAGNOSIS — B349 Viral infection, unspecified: Secondary | ICD-10-CM | POA: Diagnosis not present

## 2019-10-23 DIAGNOSIS — G894 Chronic pain syndrome: Secondary | ICD-10-CM | POA: Diagnosis not present

## 2019-10-23 DIAGNOSIS — R413 Other amnesia: Secondary | ICD-10-CM | POA: Diagnosis not present

## 2019-10-23 DIAGNOSIS — Z6822 Body mass index (BMI) 22.0-22.9, adult: Secondary | ICD-10-CM | POA: Diagnosis not present

## 2019-11-05 ENCOUNTER — Encounter (INDEPENDENT_AMBULATORY_CARE_PROVIDER_SITE_OTHER): Payer: Self-pay | Admitting: *Deleted

## 2019-11-19 DIAGNOSIS — Z681 Body mass index (BMI) 19 or less, adult: Secondary | ICD-10-CM | POA: Diagnosis not present

## 2019-11-19 DIAGNOSIS — L03116 Cellulitis of left lower limb: Secondary | ICD-10-CM | POA: Diagnosis not present

## 2019-11-19 DIAGNOSIS — L309 Dermatitis, unspecified: Secondary | ICD-10-CM | POA: Diagnosis not present

## 2019-11-19 DIAGNOSIS — B351 Tinea unguium: Secondary | ICD-10-CM | POA: Diagnosis not present

## 2019-11-19 DIAGNOSIS — M1991 Primary osteoarthritis, unspecified site: Secondary | ICD-10-CM | POA: Diagnosis not present

## 2019-11-21 DIAGNOSIS — E0500 Thyrotoxicosis with diffuse goiter without thyrotoxic crisis or storm: Secondary | ICD-10-CM | POA: Diagnosis not present

## 2019-11-21 DIAGNOSIS — Z720 Tobacco use: Secondary | ICD-10-CM | POA: Diagnosis not present

## 2019-11-21 DIAGNOSIS — J849 Interstitial pulmonary disease, unspecified: Secondary | ICD-10-CM | POA: Diagnosis not present

## 2019-11-21 DIAGNOSIS — M1991 Primary osteoarthritis, unspecified site: Secondary | ICD-10-CM | POA: Diagnosis not present

## 2019-11-25 ENCOUNTER — Ambulatory Visit (HOSPITAL_COMMUNITY)
Admission: RE | Admit: 2019-11-25 | Discharge: 2019-11-25 | Disposition: A | Discharge status: Home / Self Care | Payer: Medicare Other | Source: Ambulatory Visit | Attending: Physician Assistant | Admitting: Physician Assistant

## 2019-11-25 ENCOUNTER — Other Ambulatory Visit (HOSPITAL_COMMUNITY): Payer: Self-pay | Admitting: Physician Assistant

## 2019-11-25 ENCOUNTER — Other Ambulatory Visit: Payer: Self-pay

## 2019-11-25 DIAGNOSIS — G43909 Migraine, unspecified, not intractable, without status migrainosus: Secondary | ICD-10-CM | POA: Diagnosis not present

## 2019-11-25 DIAGNOSIS — M79672 Pain in left foot: Secondary | ICD-10-CM | POA: Insufficient documentation

## 2019-11-25 DIAGNOSIS — Z681 Body mass index (BMI) 19 or less, adult: Secondary | ICD-10-CM | POA: Diagnosis not present

## 2019-11-25 DIAGNOSIS — M19072 Primary osteoarthritis, left ankle and foot: Secondary | ICD-10-CM | POA: Diagnosis not present

## 2019-12-04 ENCOUNTER — Emergency Department (HOSPITAL_COMMUNITY)
Admission: EM | Admit: 2019-12-04 | Discharge: 2019-12-04 | Disposition: A | Discharge status: Home / Self Care | Payer: Medicare Other | Attending: Emergency Medicine | Admitting: Emergency Medicine

## 2019-12-04 ENCOUNTER — Encounter (HOSPITAL_COMMUNITY): Payer: Self-pay

## 2019-12-04 ENCOUNTER — Other Ambulatory Visit: Payer: Self-pay

## 2019-12-04 DIAGNOSIS — K625 Hemorrhage of anus and rectum: Secondary | ICD-10-CM | POA: Insufficient documentation

## 2019-12-04 DIAGNOSIS — Z5321 Procedure and treatment not carried out due to patient leaving prior to being seen by health care provider: Secondary | ICD-10-CM | POA: Insufficient documentation

## 2019-12-04 LAB — COMPREHENSIVE METABOLIC PANEL
ALT: 13 U/L (ref 0–44)
AST: 17 U/L (ref 15–41)
Albumin: 3.8 g/dL (ref 3.5–5.0)
Alkaline Phosphatase: 75 U/L (ref 38–126)
Anion gap: 6 (ref 5–15)
BUN: 24 mg/dL — ABNORMAL HIGH (ref 8–23)
CO2: 24 mmol/L (ref 22–32)
Calcium: 8.9 mg/dL (ref 8.9–10.3)
Chloride: 105 mmol/L (ref 98–111)
Creatinine, Ser: 1.53 mg/dL — ABNORMAL HIGH (ref 0.44–1.00)
GFR, Estimated: 37 mL/min — ABNORMAL LOW (ref >60)
Glucose, Bld: 122 mg/dL — ABNORMAL HIGH (ref 70–99)
Potassium: 4 mmol/L (ref 3.5–5.1)
Sodium: 135 mmol/L (ref 135–145)
Total Bilirubin: 0.3 mg/dL (ref 0.3–1.2)
Total Protein: 8.4 g/dL — ABNORMAL HIGH (ref 6.5–8.1)

## 2019-12-04 LAB — CBC
HCT: 41.6 % (ref 36.0–46.0)
Hemoglobin: 13.4 g/dL (ref 12.0–15.0)
MCH: 32.1 pg (ref 26.0–34.0)
MCHC: 32.2 g/dL (ref 30.0–36.0)
MCV: 99.5 fL (ref 80.0–100.0)
Platelets: 291 10*3/uL (ref 150–400)
RBC: 4.18 MIL/uL (ref 3.87–5.11)
RDW: 13.2 % (ref 11.5–15.5)
WBC: 21.3 10*3/uL — ABNORMAL HIGH (ref 4.0–10.5)
nRBC: 0 % (ref 0.0–0.2)

## 2019-12-04 LAB — TYPE AND SCREEN
ABO/RH(D): A POS
Antibody Screen: NEGATIVE

## 2019-12-04 NOTE — ED Triage Notes (Signed)
Pt reports abdominal pain during the night attempted to have a BM. First BM was normal x 2 then had loose stool with blood and clots in it. Pt reports no further loose stools since or blood in stools . No further abdominal pain. Pt completed antibiotics for foot 3 days ago

## 2019-12-22 DIAGNOSIS — J849 Interstitial pulmonary disease, unspecified: Secondary | ICD-10-CM | POA: Diagnosis not present

## 2019-12-22 DIAGNOSIS — Z720 Tobacco use: Secondary | ICD-10-CM | POA: Diagnosis not present

## 2019-12-22 DIAGNOSIS — M1991 Primary osteoarthritis, unspecified site: Secondary | ICD-10-CM | POA: Diagnosis not present

## 2019-12-22 DIAGNOSIS — E0500 Thyrotoxicosis with diffuse goiter without thyrotoxic crisis or storm: Secondary | ICD-10-CM | POA: Diagnosis not present

## 2019-12-23 DIAGNOSIS — Z681 Body mass index (BMI) 19 or less, adult: Secondary | ICD-10-CM | POA: Diagnosis not present

## 2019-12-23 DIAGNOSIS — M0689 Other specified rheumatoid arthritis, multiple sites: Secondary | ICD-10-CM | POA: Diagnosis not present

## 2019-12-23 DIAGNOSIS — M1991 Primary osteoarthritis, unspecified site: Secondary | ICD-10-CM | POA: Diagnosis not present

## 2019-12-23 DIAGNOSIS — E063 Autoimmune thyroiditis: Secondary | ICD-10-CM | POA: Diagnosis not present

## 2019-12-23 DIAGNOSIS — G894 Chronic pain syndrome: Secondary | ICD-10-CM | POA: Diagnosis not present

## 2020-01-21 DIAGNOSIS — Z681 Body mass index (BMI) 19 or less, adult: Secondary | ICD-10-CM | POA: Diagnosis not present

## 2020-01-21 DIAGNOSIS — G894 Chronic pain syndrome: Secondary | ICD-10-CM | POA: Diagnosis not present

## 2020-01-21 DIAGNOSIS — R052 Subacute cough: Secondary | ICD-10-CM | POA: Diagnosis not present

## 2020-01-21 DIAGNOSIS — I10 Essential (primary) hypertension: Secondary | ICD-10-CM | POA: Diagnosis not present

## 2020-01-21 DIAGNOSIS — E271 Primary adrenocortical insufficiency: Secondary | ICD-10-CM | POA: Diagnosis not present

## 2020-01-22 DIAGNOSIS — E0500 Thyrotoxicosis with diffuse goiter without thyrotoxic crisis or storm: Secondary | ICD-10-CM | POA: Diagnosis not present

## 2020-01-22 DIAGNOSIS — M1991 Primary osteoarthritis, unspecified site: Secondary | ICD-10-CM | POA: Diagnosis not present

## 2020-01-22 DIAGNOSIS — Z720 Tobacco use: Secondary | ICD-10-CM | POA: Diagnosis not present

## 2020-01-22 DIAGNOSIS — J849 Interstitial pulmonary disease, unspecified: Secondary | ICD-10-CM | POA: Diagnosis not present

## 2020-02-04 DIAGNOSIS — Z1231 Encounter for screening mammogram for malignant neoplasm of breast: Secondary | ICD-10-CM | POA: Diagnosis not present

## 2020-02-20 DIAGNOSIS — J849 Interstitial pulmonary disease, unspecified: Secondary | ICD-10-CM | POA: Diagnosis not present

## 2020-02-20 DIAGNOSIS — M1991 Primary osteoarthritis, unspecified site: Secondary | ICD-10-CM | POA: Diagnosis not present

## 2020-02-20 DIAGNOSIS — I10 Essential (primary) hypertension: Secondary | ICD-10-CM | POA: Diagnosis not present

## 2020-02-20 DIAGNOSIS — E0500 Thyrotoxicosis with diffuse goiter without thyrotoxic crisis or storm: Secondary | ICD-10-CM | POA: Diagnosis not present

## 2020-02-22 DIAGNOSIS — Z0001 Encounter for general adult medical examination with abnormal findings: Secondary | ICD-10-CM | POA: Diagnosis not present

## 2020-02-22 DIAGNOSIS — E782 Mixed hyperlipidemia: Secondary | ICD-10-CM | POA: Diagnosis not present

## 2020-02-22 DIAGNOSIS — G894 Chronic pain syndrome: Secondary | ICD-10-CM | POA: Diagnosis not present

## 2020-02-22 DIAGNOSIS — E039 Hypothyroidism, unspecified: Secondary | ICD-10-CM | POA: Diagnosis not present

## 2020-02-22 DIAGNOSIS — E7849 Other hyperlipidemia: Secondary | ICD-10-CM | POA: Diagnosis not present

## 2020-02-22 DIAGNOSIS — Z681 Body mass index (BMI) 19 or less, adult: Secondary | ICD-10-CM | POA: Diagnosis not present

## 2020-02-22 DIAGNOSIS — E063 Autoimmune thyroiditis: Secondary | ICD-10-CM | POA: Diagnosis not present

## 2020-02-22 DIAGNOSIS — Z1389 Encounter for screening for other disorder: Secondary | ICD-10-CM | POA: Diagnosis not present

## 2020-02-25 ENCOUNTER — Other Ambulatory Visit (HOSPITAL_COMMUNITY): Payer: Self-pay | Admitting: Family Medicine

## 2020-02-25 DIAGNOSIS — Z0001 Encounter for general adult medical examination with abnormal findings: Secondary | ICD-10-CM

## 2020-03-04 ENCOUNTER — Encounter (INDEPENDENT_AMBULATORY_CARE_PROVIDER_SITE_OTHER): Payer: Self-pay | Admitting: *Deleted

## 2020-03-21 DIAGNOSIS — M150 Primary generalized (osteo)arthritis: Secondary | ICD-10-CM | POA: Diagnosis not present

## 2020-03-21 DIAGNOSIS — Z6820 Body mass index (BMI) 20.0-20.9, adult: Secondary | ICD-10-CM | POA: Diagnosis not present

## 2020-03-21 DIAGNOSIS — M545 Low back pain, unspecified: Secondary | ICD-10-CM | POA: Diagnosis not present

## 2020-03-21 DIAGNOSIS — J849 Interstitial pulmonary disease, unspecified: Secondary | ICD-10-CM | POA: Diagnosis not present

## 2020-03-21 DIAGNOSIS — M2550 Pain in unspecified joint: Secondary | ICD-10-CM | POA: Diagnosis not present

## 2020-03-21 DIAGNOSIS — M154 Erosive (osteo)arthritis: Secondary | ICD-10-CM | POA: Diagnosis not present

## 2020-03-28 DIAGNOSIS — M159 Polyosteoarthritis, unspecified: Secondary | ICD-10-CM | POA: Diagnosis not present

## 2020-03-28 DIAGNOSIS — Z681 Body mass index (BMI) 19 or less, adult: Secondary | ICD-10-CM | POA: Diagnosis not present

## 2020-03-28 DIAGNOSIS — I6523 Occlusion and stenosis of bilateral carotid arteries: Secondary | ICD-10-CM | POA: Diagnosis not present

## 2020-03-28 DIAGNOSIS — I10 Essential (primary) hypertension: Secondary | ICD-10-CM | POA: Diagnosis not present

## 2020-03-28 DIAGNOSIS — G894 Chronic pain syndrome: Secondary | ICD-10-CM | POA: Diagnosis not present

## 2020-03-29 ENCOUNTER — Encounter: Payer: Self-pay | Admitting: Pulmonary Disease

## 2020-03-29 ENCOUNTER — Other Ambulatory Visit: Payer: Self-pay | Admitting: *Deleted

## 2020-03-29 DIAGNOSIS — J849 Interstitial pulmonary disease, unspecified: Secondary | ICD-10-CM

## 2020-03-29 NOTE — Progress Notes (Signed)
Patient has been lost to follow-up since April 2021 We did not get a high-res CT as planned  Please get in touch with the patient to reschedule CT high-resolution for interstitial lung disease and follow-up in clinic.  [name] MD [facility]

## 2020-03-29 NOTE — Progress Notes (Signed)
Patient has been lost to follow-up since April 2021 We did not get a high-res CT as planned  Please get in touch with the patient to reschedule CT high-resolution for interstitial lung disease and follow-up in clinic.  [name] MD [facility]

## 2020-03-30 ENCOUNTER — Other Ambulatory Visit: Payer: Self-pay

## 2020-03-30 ENCOUNTER — Ambulatory Visit: Payer: Medicare Other | Admitting: Physician Assistant

## 2020-03-30 ENCOUNTER — Ambulatory Visit (HOSPITAL_COMMUNITY)
Admission: RE | Admit: 2020-03-30 | Discharge: 2020-03-30 | Disposition: A | Discharge status: Home / Self Care | Payer: Medicare Other | Source: Ambulatory Visit | Attending: Vascular Surgery | Admitting: Vascular Surgery

## 2020-03-30 VITALS — BP 128/82 | HR 62 | Temp 97.3°F | Resp 20 | Ht 64.0 in | Wt 120.9 lb

## 2020-03-30 DIAGNOSIS — I6523 Occlusion and stenosis of bilateral carotid arteries: Secondary | ICD-10-CM

## 2020-03-30 NOTE — Progress Notes (Signed)
History of Present Illness:  Patient is a 64 y.o. year old female who presents for evaluation of carotid stenosis.  The patient denies symptoms of TIA, amaurosis, or stroke.  She continues to take a daily asa. She cont. To smoke on a daily basis.    Past Medical History:  Diagnosis Date  . Carotid artery occlusion   . DDD (degenerative disc disease)   . Depression   . DVT (deep venous thrombosis) ([place]) 2008   right leg  . Hypertension   . Thyroid disease     Past Surgical History:  Procedure Laterality Date  . CESAREAN SECTION    . DILATION AND CURETTAGE OF UTERUS    . SHOULDER ARTHROSCOPY Left 2005     Social History Social History   Tobacco Use  . Smoking status: Current Every Day Smoker    Packs/day: 0.50    Types: Cigarettes    Start date: 03/29/1970  . Smokeless tobacco: Never Used  . Tobacco comment: Less than 1 pk per day  Vaping Use  . Vaping Use: Every day  Substance Use Topics  . Alcohol use: No    Alcohol/week: 0.0 standard drinks  . Drug use: No    Family History Family History  Problem Relation Age of Onset  . Heart disease Mother   . Hyperlipidemia Mother   . Heart attack Mother   . AAA (abdominal aortic aneurysm) Mother   . Cancer Father   . Heart disease Brother   . Hypertension Brother   . Hypertension Son     Allergies  No Known Allergies   Current Outpatient Medications  Medication Sig Dispense Refill  . amLODipine (NORVASC) 5 MG tablet Take 5 mg by mouth daily.    Marland Kitchen aspirin EC 81 MG tablet Take 81 mg by mouth daily.    Marland Kitchen buPROPion (WELLBUTRIN SR) 150 MG 12 hr tablet Take 1 tablet by mouth daily.    . butalbital-acetaminophen-caffeine (FIORICET, ESGIC) 50-325-40 MG tablet     . celecoxib (CELEBREX) 100 MG capsule Take 100 mg by mouth 2 (two) times daily.    . Estradiol (ESTRACE PO) Take by mouth.    Konrad Saha 2 MG vaginal ring SMARTSIG:Ring Vaginal Every 3 Months    . Evolocumab with Infusor ([place]) 420  MG/3.5ML SOCT Inject into the skin.    Marland Kitchen HYDROcodone-acetaminophen (NORCO) 10-325 MG per tablet Take 1 tablet by mouth every 4 (four) hours as needed. Reported on 07/05/2015    . levothyroxine (SYNTHROID) 125 MCG tablet Take 125 mcg by mouth daily.    . methocarbamol (ROBAXIN) 500 MG tablet Take 1 tablet (500 mg total) by mouth 4 (four) times daily. 28 tablet 0  . metoprolol tartrate (LOPRESSOR) 25 MG tablet Take 25 mg by mouth 2 (two) times daily.    Marland Kitchen oxyCODONE (OXY IR/ROXICODONE) 5 MG immediate release tablet 15 mg.     . valsartan (DIOVAN) 160 MG tablet Take 160 mg by mouth daily.     No current facility-administered medications for this visit.    ROS:   General:  No weight loss, Fever, chills  HEENT: No recent headaches, no nasal bleeding, no visual changes, no sore throat  Neurologic: No dizziness, blackouts, seizures. No recent symptoms of stroke or mini- stroke. No recent episodes of slurred speech, or temporary blindness.  Cardiac: No recent episodes of chest pain/pressure, no shortness of breath at rest.  No shortness of breath with exertion.  Denies history of  atrial fibrillation or irregular heartbeat  Vascular: No history of rest pain in feet.  No history of claudication.  + history of non-healing ulcer, No history of DVT   Pulmonary: No home oxygen, no productive cough, no hemoptysis,  No asthma or wheezing  Musculoskeletal:  [ ]  Arthritis, [ ]  Low back pain,  [ ]  Joint pain  Hematologic:No history of hypercoagulable state.  No history of easy bleeding.  No history of anemia  Gastrointestinal: No hematochezia or melena,  No gastroesophageal reflux, no trouble swallowing  Urinary: [ ]  chronic Kidney disease, [ ]  on HD - [ ]  MWF or [ ]  TTHS, [ ]  Burning with urination, [ ]  Frequent urination, [ ]  Difficulty urinating;   Skin: No rashes  Psychological: No history of anxiety,  No history of depression   Physical Examination  Vitals:   03/30/20 1034 03/30/20 1036   BP: 132/81 128/82  Pulse: 62   Resp: 20   Temp: (!) 97.3 F (36.3 C)   TempSrc: Temporal   SpO2: 94%   Weight: 120 lb 14.4 oz (54.8 kg)   Height: 5\' 4"  (1.626 m)     Body mass index is 20.75 kg/m.  General:  Alert and oriented, no acute distress HEENT: Normal Neck: No bruit or JVD Pulmonary: Clear to auscultation bilaterally Cardiac: Regular Rate and Rhythm without murmur Gastrointestinal: Soft, non-tender, non-distended, no mass, no scars Skin: No rash Extremity Pulses:  2+ radial, brachial, femoral, dorsalis pedis, posterior tibial pulses bilaterally Musculoskeletal: No deformity or edema  Neurologic: Upper and lower extremity motor 5/5 and symmetric  DATA:    Right Carotid Findings:  +----------+--------+--------+--------+-------------------------+--------+       PSV cm/sEDV cm/sStenosisPlaque Description    Comments  +----------+--------+--------+--------+-------------------------+--------+  CCA Prox 65   17       heterogenous             +----------+--------+--------+--------+-------------------------+--------+  CCA Mid  59   20       heterogenous             +----------+--------+--------+--------+-------------------------+--------+  CCA Distal95   30       heterogenous and calcific      +----------+--------+--------+--------+-------------------------+--------+  ICA Prox 155   57   40-59% heterogenous and calcific      +----------+--------+--------+--------+-------------------------+--------+  ICA Mid  157   48       heterogenous             +----------+--------+--------+--------+-------------------------+--------+  ICA Distal87   29                          +----------+--------+--------+--------+-------------------------+--------+  ECA    74   13                           +----------+--------+--------+--------+-------------------------+--------+   +----------+--------+-------+----------------+-------------------+       PSV cm/sEDV cmsDescribe    Arm Pressure (mmHG)  +----------+--------+-------+----------------+-------------------+  [id number]       Multiphasic, WNL            +----------+--------+-------+----------------+-------------------+   +---------+--------+--+--------+----------+  VertebralPSV cm/s49EDV cm/sRetrograde  +---------+--------+--+--------+----------+      Left Carotid Findings:  +----------+-------+-------+--------+---------------------------------+----  ----+       PSV  EDV  StenosisPlaque Description         Comments       cm/s  cm/s                              +----------+-------+-------+--------+---------------------------------+----  ----+  CCA Prox 67   20       heterogenous and irregular           +----------+-------+-------+--------+---------------------------------+----  ----+  CCA Mid  70   24       heterogenous and irregular           +----------+-------+-------+--------+---------------------------------+----  ----+  CCA Distal71   24       heterogenous, irregular and                           calcific                    +----------+-------+-------+--------+---------------------------------+----  ----+  ICA Prox 230  79   60-79% heterogenous, irregular and                           calcific                    +----------+-------+-------+--------+---------------------------------+----  ----+  ICA Mid  198  65       heterogenous                   +----------+-------+-------+--------+---------------------------------+----  ----+  ICA [id number]  43       heterogenous                  +----------+-------+-------+--------+---------------------------------+----  ----+  ECA    133  15       heterogenous                  +----------+-------+-------+--------+---------------------------------+----  ----+   +----------+--------+--------+----------------+-------------------+       PSV cm/sEDV cm/sDescribe    Arm Pressure (mmHG)  +----------+--------+--------+----------------+-------------------+  [id number]       Multiphasic, WNL            +----------+--------+--------+----------------+-------------------+   +---------+--------+--+--------+--+---------+  VertebralPSV cm/s83EDV cm/s28Antegrade  +---------+--------+--+--------+--+---------+        Summary:  Right Carotid: Velocities in the right ICA are consistent with a 40-59%         stenosis.   Left Carotid: Velocities in the left ICA are consistent with a 60-79%  stenosis.   Vertebrals: Left vertebral artery demonstrates antegrade flow. Right  vertebral       artery demonstrates retrograde flow.   ASSESSMENT:  Asymptomatic carotid stenosis Slight increase in left ICA stenosis, but she still remains < 79% stenosis.  Right ICA < 59% stenosis   PLAN: She will f/u in 6 months for repeat duplex.  We reviewed symptoms and if she has symptoms of stroke or TIA she will call 911.    Roxy Horseman PA-C [facility] Office: [phone]  MD in clinic [place]

## 2020-03-31 ENCOUNTER — Other Ambulatory Visit: Payer: Self-pay

## 2020-03-31 DIAGNOSIS — I6523 Occlusion and stenosis of bilateral carotid arteries: Secondary | ICD-10-CM

## 2020-04-08 ENCOUNTER — Ambulatory Visit (HOSPITAL_COMMUNITY)
Admission: RE | Admit: 2020-04-08 | Discharge: 2020-04-08 | Disposition: A | Discharge status: Home / Self Care | Payer: Medicare Other | Source: Ambulatory Visit | Attending: Family Medicine | Admitting: Family Medicine

## 2020-04-08 DIAGNOSIS — E0590 Thyrotoxicosis, unspecified without thyrotoxic crisis or storm: Secondary | ICD-10-CM | POA: Diagnosis not present

## 2020-04-08 DIAGNOSIS — Z13820 Encounter for screening for osteoporosis: Secondary | ICD-10-CM | POA: Diagnosis not present

## 2020-04-08 DIAGNOSIS — Z780 Asymptomatic menopausal state: Secondary | ICD-10-CM | POA: Insufficient documentation

## 2020-04-08 DIAGNOSIS — Z0001 Encounter for general adult medical examination with abnormal findings: Secondary | ICD-10-CM

## 2020-04-20 DIAGNOSIS — I10 Essential (primary) hypertension: Secondary | ICD-10-CM | POA: Diagnosis not present

## 2020-04-20 DIAGNOSIS — E7849 Other hyperlipidemia: Secondary | ICD-10-CM | POA: Diagnosis not present

## 2020-04-20 DIAGNOSIS — J849 Interstitial pulmonary disease, unspecified: Secondary | ICD-10-CM | POA: Diagnosis not present

## 2020-04-20 DIAGNOSIS — Z720 Tobacco use: Secondary | ICD-10-CM | POA: Diagnosis not present

## 2020-04-22 DIAGNOSIS — H6092 Unspecified otitis externa, left ear: Secondary | ICD-10-CM | POA: Diagnosis not present

## 2020-04-22 DIAGNOSIS — Z681 Body mass index (BMI) 19 or less, adult: Secondary | ICD-10-CM | POA: Diagnosis not present

## 2020-04-22 DIAGNOSIS — M5136 Other intervertebral disc degeneration, lumbar region: Secondary | ICD-10-CM | POA: Diagnosis not present

## 2020-04-22 DIAGNOSIS — E039 Hypothyroidism, unspecified: Secondary | ICD-10-CM | POA: Diagnosis not present

## 2020-04-22 DIAGNOSIS — G43909 Migraine, unspecified, not intractable, without status migrainosus: Secondary | ICD-10-CM | POA: Diagnosis not present

## 2020-04-22 DIAGNOSIS — M1991 Primary osteoarthritis, unspecified site: Secondary | ICD-10-CM | POA: Diagnosis not present

## 2020-04-22 DIAGNOSIS — G894 Chronic pain syndrome: Secondary | ICD-10-CM | POA: Diagnosis not present

## 2020-04-27 ENCOUNTER — Ambulatory Visit (INDEPENDENT_AMBULATORY_CARE_PROVIDER_SITE_OTHER): Payer: Medicare Other | Admitting: Gastroenterology

## 2020-04-29 ENCOUNTER — Ambulatory Visit (HOSPITAL_COMMUNITY)
Admission: RE | Admit: 2020-04-29 | Discharge: 2020-04-29 | Disposition: A | Discharge status: Home / Self Care | Payer: Medicare Other | Source: Ambulatory Visit | Attending: Pulmonary Disease | Admitting: Pulmonary Disease

## 2020-04-29 ENCOUNTER — Other Ambulatory Visit: Payer: Self-pay

## 2020-04-29 DIAGNOSIS — J479 Bronchiectasis, uncomplicated: Secondary | ICD-10-CM | POA: Diagnosis not present

## 2020-04-29 DIAGNOSIS — J849 Interstitial pulmonary disease, unspecified: Secondary | ICD-10-CM | POA: Diagnosis not present

## 2020-04-29 DIAGNOSIS — J84112 Idiopathic pulmonary fibrosis: Secondary | ICD-10-CM | POA: Diagnosis not present

## 2020-04-29 DIAGNOSIS — J984 Other disorders of lung: Secondary | ICD-10-CM | POA: Diagnosis not present

## 2020-04-29 DIAGNOSIS — I2510 Atherosclerotic heart disease of native coronary artery without angina pectoris: Secondary | ICD-10-CM | POA: Diagnosis not present

## 2020-05-09 ENCOUNTER — Ambulatory Visit: Payer: Medicare Other | Admitting: Pulmonary Disease

## 2020-05-09 ENCOUNTER — Encounter: Payer: Self-pay | Admitting: Pulmonary Disease

## 2020-05-09 ENCOUNTER — Other Ambulatory Visit: Payer: Self-pay

## 2020-05-09 VITALS — BP 122/78 | HR 64 | Temp 97.2°F | Ht 61.0 in | Wt 120.6 lb

## 2020-05-09 DIAGNOSIS — J849 Interstitial pulmonary disease, unspecified: Secondary | ICD-10-CM | POA: Diagnosis not present

## 2020-05-09 NOTE — Progress Notes (Signed)
Samantha Osborne    [id number]    08/21/1955  Primary Care Physician:Fusco, Purcell Nails, MD  Referring Physician: Redmond School, [place],  [place] [address]  Chief complaint: Follow up interstitial lung disease, emphysema, active smoker  HPI: Active smoker with history of hypertension, osteo-arthritis, DVT Referred for evaluation of abnormal CT scan  CT scan done in June 2020 for atypical chest pain.  Noted to have diffuse interstitial lung disease as noted below, atypical for UIP. Has small joint pain of the hands, morning stiffness, transient rash at the bottom of the legs.  Denies any difficulty swallowing.  Has history of obstructive sleep apnea diagnosed and 2017 but is noncompliant with CPAP.  Evaluated by Dr. Trudie Reed, rheumatology in 2019 and 2022 for elevated ANA.  This was not felt to be autoimmune.  She was given a diagnosis of osteoarthritis.  She tried Symbicort in 2021 but stopped as it did not make any difference  Pets: Cat, no birds Occupation: Copywriter, advertising Exposures, interstitial lung disease questionarre  07/29/2018-Possible mold exposure at home.  No hot tubs, Jacuzzi.  She had received a dose of nitrofurantoin over 5 years ago Smoking history: 50-pack-year smoker.  Continues to smoke 1 pack/day Travel history: No significant problems history Relevant family history: No significant history of lung disease.  Sister has symptoms of rheumatoid arthritis as well.  Interim history: Here for follow-up of CT She is doing well with regard to breathing with no dyspnea.  She is not on any inhalers at present.   Continues to smoke half a pack to 1 pack every day  Received note from Dr. Trudie Reed 03/21/2020 She has a diagnosis of osteoarthritis of the hands in general osteoarthritis.  No evidence of connective tissue disease Joint symptoms are stable.  Rheumatology has signed off.   Outpatient Encounter Medications as of 05/09/2020  Medication  Sig  . amLODipine (NORVASC) 5 MG tablet Take 5 mg by mouth daily.  Marland Kitchen aspirin EC 81 MG tablet Take 81 mg by mouth daily.  Marland Kitchen buPROPion (WELLBUTRIN SR) 150 MG 12 hr tablet Take 1 tablet by mouth daily.  . butalbital-acetaminophen-caffeine (FIORICET, ESGIC) 50-325-40 MG tablet   . celecoxib (CELEBREX) 100 MG capsule Take 100 mg by mouth 2 (two) times daily.  . Estradiol (ESTRACE PO) Take by mouth.  Konrad Saha 2 MG vaginal ring SMARTSIG:Ring Vaginal Every 3 Months  . Evolocumab with Infusor ([place]) 420 MG/3.5ML SOCT Inject into the skin.  Marland Kitchen HYDROcodone-acetaminophen (NORCO) 10-325 MG per tablet Take 1 tablet by mouth every 4 (four) hours as needed. Reported on 07/05/2015  . levothyroxine (SYNTHROID) 125 MCG tablet Take 125 mcg by mouth daily.  . methocarbamol (ROBAXIN) 500 MG tablet Take 1 tablet (500 mg total) by mouth 4 (four) times daily.  . metoprolol tartrate (LOPRESSOR) 25 MG tablet Take 25 mg by mouth 2 (two) times daily.  Marland Kitchen omega-3 acid ethyl esters (LOVAZA) 1 g capsule Take 1 g by mouth daily.  Marland Kitchen oxyCODONE (OXY IR/ROXICODONE) 5 MG immediate release tablet 15 mg.   . valsartan (DIOVAN) 160 MG tablet Take 160 mg by mouth daily.   No facility-administered encounter medications on file as of 05/09/2020.   Physical Exam: Blood pressure 122/78, pulse 64, temperature (!) 97.2 F (36.2 C), temperature source Temporal, height 5\' 1"  (1.549 m), weight 120 lb 9.6 oz (54.7 kg), SpO2 97 %. Gen:      No acute distress HEENT:  EOMI,  sclera anicteric Neck:     No masses; no thyromegaly Lungs:    Clear to auscultation bilaterally; normal respiratory effort CV:         Regular rate and rhythm; no murmurs Abd:      + bowel sounds; soft, non-tender; no palpable masses, no distension Ext:    No edema; adequate peripheral perfusion Skin:      Warm and dry; no rash Neuro: alert and oriented x 3 Psych: normal mood and affect  Data Reviewed: Imaging: CT chest 06/25/2018- enlarged  mediastinal lymph nodes.  Diffuse groundglass opacities, mild traction bronchiectasis without basilar gradient.  Indeterminate for UIP.    CTA 04/12/2019-diffuse groundglass opacities with mild traction bronchiectasis.  Emphysema.  High-resolution CT 04/29/2020-stable groundglass with mild traction bronchiectasis, emphysema.  Unchanged compared to 2021.  Labs: ANA 07/29/2018-1:320, nuclear, homogeneous Negative myositis, hypersensitivity panel, SCL 70 and Sjogren's antibodies.  PFTs: 05/14/2019 FVC 2.25 [90%], FEV1 2.06 [92%], F/F 91, TLC 3.45 [73%], DLCO 10.19 [55%] Mild restriction with moderate diffusion defect.  Slightly worse compared to 2020.  6-minute walk test 05/19/2019-476 m, nadir O2 sat 96%.  Assessment:  Follow-up for interstitial lung disease CT reviewed with nonspecific pattern of fibrosis, groundglass attenuation.  Differential diagnosis is RB ILD versus IPAF given elevated ANA but had a rheumatology evaluation and was told there is no evidence of autoimmune condition and rheumatology has signed off. Has some mold exposure lin 2020 but house is clean and there is no ongoing issues.  Discussed further work-up including surgical lung biopsy versus bronchoscopy but patient does not want to proceed at this time. Told her that she really needs to quit smoking as this could be smoking-related ILD. Her symptoms and CT are stable and given her reluctance to undergo further work-up we will continue to monitor Repeat PFTs in 6 months  Active smoker Currently on Wellbutrin, nicotine Had headache with Chantix.  Emphysema No obstruction noted on PFTs Not on controller medication as they did not help in the past  Plan/Recommendations: - PFTs in 6 months  [name] MD [facility] 05/09/2020, 10:37 AM  CC: Redmond School, MD

## 2020-05-09 NOTE — Patient Instructions (Signed)
I am glad you are stable with regard to breathing His CT continues to show persistent inflammation and scarring.  This may be from ongoing smoking  Continue Wellbutrin, nicotine.  Work on quitting smoking Per our discussion we will hold off a biopsy at this point I will order PFTs in 6 months and follow-up in clinic after that for reevaluation

## 2020-05-21 DIAGNOSIS — I10 Essential (primary) hypertension: Secondary | ICD-10-CM | POA: Diagnosis not present

## 2020-05-21 DIAGNOSIS — J849 Interstitial pulmonary disease, unspecified: Secondary | ICD-10-CM | POA: Diagnosis not present

## 2020-05-21 DIAGNOSIS — E7849 Other hyperlipidemia: Secondary | ICD-10-CM | POA: Diagnosis not present

## 2020-05-23 DIAGNOSIS — G894 Chronic pain syndrome: Secondary | ICD-10-CM | POA: Diagnosis not present

## 2020-05-23 DIAGNOSIS — Z681 Body mass index (BMI) 19 or less, adult: Secondary | ICD-10-CM | POA: Diagnosis not present

## 2020-05-23 DIAGNOSIS — M5136 Other intervertebral disc degeneration, lumbar region: Secondary | ICD-10-CM | POA: Diagnosis not present

## 2020-05-23 DIAGNOSIS — M1991 Primary osteoarthritis, unspecified site: Secondary | ICD-10-CM | POA: Diagnosis not present

## 2020-06-21 DIAGNOSIS — J849 Interstitial pulmonary disease, unspecified: Secondary | ICD-10-CM | POA: Diagnosis not present

## 2020-06-21 DIAGNOSIS — I10 Essential (primary) hypertension: Secondary | ICD-10-CM | POA: Diagnosis not present

## 2020-06-21 DIAGNOSIS — E7849 Other hyperlipidemia: Secondary | ICD-10-CM | POA: Diagnosis not present

## 2020-06-23 DIAGNOSIS — G894 Chronic pain syndrome: Secondary | ICD-10-CM | POA: Diagnosis not present

## 2020-06-23 DIAGNOSIS — H6121 Impacted cerumen, right ear: Secondary | ICD-10-CM | POA: Diagnosis not present

## 2020-06-23 DIAGNOSIS — Z681 Body mass index (BMI) 19 or less, adult: Secondary | ICD-10-CM | POA: Diagnosis not present

## 2020-06-23 DIAGNOSIS — S46001A Unspecified injury of muscle(s) and tendon(s) of the rotator cuff of right shoulder, initial encounter: Secondary | ICD-10-CM | POA: Diagnosis not present

## 2020-07-21 DIAGNOSIS — I10 Essential (primary) hypertension: Secondary | ICD-10-CM | POA: Diagnosis not present

## 2020-07-21 DIAGNOSIS — J849 Interstitial pulmonary disease, unspecified: Secondary | ICD-10-CM | POA: Diagnosis not present

## 2020-07-21 DIAGNOSIS — E782 Mixed hyperlipidemia: Secondary | ICD-10-CM | POA: Diagnosis not present

## 2020-07-22 DIAGNOSIS — Z6822 Body mass index (BMI) 22.0-22.9, adult: Secondary | ICD-10-CM | POA: Diagnosis not present

## 2020-07-22 DIAGNOSIS — M75102 Unspecified rotator cuff tear or rupture of left shoulder, not specified as traumatic: Secondary | ICD-10-CM | POA: Diagnosis not present

## 2020-07-22 DIAGNOSIS — G894 Chronic pain syndrome: Secondary | ICD-10-CM | POA: Diagnosis not present

## 2020-07-22 DIAGNOSIS — M0689 Other specified rheumatoid arthritis, multiple sites: Secondary | ICD-10-CM | POA: Diagnosis not present

## 2020-07-22 DIAGNOSIS — I10 Essential (primary) hypertension: Secondary | ICD-10-CM | POA: Diagnosis not present

## 2020-08-19 DIAGNOSIS — Z6822 Body mass index (BMI) 22.0-22.9, adult: Secondary | ICD-10-CM | POA: Diagnosis not present

## 2020-08-19 DIAGNOSIS — L309 Dermatitis, unspecified: Secondary | ICD-10-CM | POA: Diagnosis not present

## 2020-08-19 DIAGNOSIS — G894 Chronic pain syndrome: Secondary | ICD-10-CM | POA: Diagnosis not present

## 2020-08-21 DIAGNOSIS — E7849 Other hyperlipidemia: Secondary | ICD-10-CM | POA: Diagnosis not present

## 2020-08-21 DIAGNOSIS — I10 Essential (primary) hypertension: Secondary | ICD-10-CM | POA: Diagnosis not present

## 2020-08-21 DIAGNOSIS — J849 Interstitial pulmonary disease, unspecified: Secondary | ICD-10-CM | POA: Diagnosis not present

## 2020-08-24 DIAGNOSIS — Z6821 Body mass index (BMI) 21.0-21.9, adult: Secondary | ICD-10-CM | POA: Diagnosis not present

## 2020-09-16 DIAGNOSIS — J849 Interstitial pulmonary disease, unspecified: Secondary | ICD-10-CM | POA: Diagnosis not present

## 2020-09-16 DIAGNOSIS — G894 Chronic pain syndrome: Secondary | ICD-10-CM | POA: Diagnosis not present

## 2020-09-16 DIAGNOSIS — I10 Essential (primary) hypertension: Secondary | ICD-10-CM | POA: Diagnosis not present

## 2020-09-16 DIAGNOSIS — Z6823 Body mass index (BMI) 23.0-23.9, adult: Secondary | ICD-10-CM | POA: Diagnosis not present

## 2020-09-16 DIAGNOSIS — M1991 Primary osteoarthritis, unspecified site: Secondary | ICD-10-CM | POA: Diagnosis not present

## 2020-09-22 DIAGNOSIS — Z1211 Encounter for screening for malignant neoplasm of colon: Secondary | ICD-10-CM | POA: Diagnosis not present

## 2020-10-14 DIAGNOSIS — G894 Chronic pain syndrome: Secondary | ICD-10-CM | POA: Diagnosis not present

## 2020-10-14 DIAGNOSIS — M159 Polyosteoarthritis, unspecified: Secondary | ICD-10-CM | POA: Diagnosis not present

## 2020-10-14 DIAGNOSIS — M5136 Other intervertebral disc degeneration, lumbar region: Secondary | ICD-10-CM | POA: Diagnosis not present

## 2020-11-03 ENCOUNTER — Other Ambulatory Visit: Payer: Self-pay

## 2020-11-03 ENCOUNTER — Ambulatory Visit (HOSPITAL_COMMUNITY)
Admission: RE | Admit: 2020-11-03 | Discharge: 2020-11-03 | Disposition: A | Discharge status: Home / Self Care | Payer: Medicare Other | Source: Ambulatory Visit | Attending: Vascular Surgery | Admitting: Vascular Surgery

## 2020-11-03 ENCOUNTER — Ambulatory Visit: Payer: Medicare Other | Admitting: Physician Assistant

## 2020-11-03 ENCOUNTER — Encounter: Payer: Self-pay | Admitting: Physician Assistant

## 2020-11-03 VITALS — BP 133/82 | HR 63 | Temp 98.1°F | Resp 20 | Ht 61.0 in | Wt 122.9 lb

## 2020-11-03 DIAGNOSIS — I6523 Occlusion and stenosis of bilateral carotid arteries: Secondary | ICD-10-CM

## 2020-11-03 NOTE — Progress Notes (Signed)
Office Note     CC:  follow up Requesting Provider:  Redmond School, MD  HPI: Samantha Osborne is a 65 y.o. (08/07/1955) female who presents for surveillance follow up of carotid artery stenosis. She has no history of TIA or stroke. She has had 40-59% right ICA stenosis and 60-79% left ICA on duplex follow up. She denies any amaurosis fugax or visual changes, slurred speech, facial drooping, unilateral upper or lower extremity numbness or weakness. She does report some numbness in the finger tips in both of her hands. This occurs mostly at night but sometimes during the day if she is holding objects. She denies any lower extremity claudication, rest pain or non healing wounds. She does continue to smoke about 1/2 ppd.   The pt is not on a statin for cholesterol management due to intolerance. She is on Repatha The pt is on a daily aspirin.   Other AC:  None The pt is on BB, ARB, CCB for hypertension.   The pt is not diabetic.   Tobacco hx:  current, 1/2 ppd  Past Medical History:  Diagnosis Date   Carotid artery occlusion    DDD (degenerative disc disease)    Depression    DVT (deep venous thrombosis) ([place]) 2008   right leg   Hypertension    Thyroid disease     Past Surgical History:  Procedure Laterality Date   CESAREAN SECTION     DILATION AND CURETTAGE OF UTERUS     SHOULDER ARTHROSCOPY Left 2005    Social History   Socioeconomic History   Marital status: Married    Spouse name: Not on file   Number of children: Not on file   Years of education: Not on file   Highest education level: Not on file  Occupational History   Not on file  Tobacco Use   Smoking status: Every Day    Packs/day: 0.75    Types: Cigarettes    Start date: 03/29/1970   Smokeless tobacco: Never   Tobacco comments:    Less than 1 pk per day  Vaping Use   Vaping Use: Every day  Substance and Sexual Activity   Alcohol use: No    Alcohol/week: 0.0 standard drinks   Drug use: No   Sexual activity:  Yes  Other Topics Concern   Not on file  Social History Narrative   Not on file   Social Determinants of Health   Financial Resource Strain: Not on file  Food Insecurity: Not on file  Transportation Needs: Not on file  Physical Activity: Not on file  Stress: Not on file  Social Connections: Not on file  Intimate Partner Violence: Not on file    Family History  Problem Relation Age of Onset   Heart disease Mother    Hyperlipidemia Mother    Heart attack Mother    AAA (abdominal aortic aneurysm) Mother    Cancer Father    Heart disease Brother    Hypertension Brother    Hypertension Son     Current Outpatient Medications  Medication Sig Dispense Refill   amLODipine (NORVASC) 5 MG tablet Take 5 mg by mouth daily.     aspirin EC 81 MG tablet Take 81 mg by mouth daily.     buPROPion (WELLBUTRIN SR) 150 MG 12 hr tablet Take 1 tablet by mouth daily.     butalbital-acetaminophen-caffeine (FIORICET, ESGIC) 50-325-40 MG tablet      celecoxib (CELEBREX) 100 MG capsule Take 100 mg  by mouth 2 (two) times daily.     Estradiol (ESTRACE PO) Take by mouth.     ESTRING 2 MG vaginal ring SMARTSIG:Ring Vaginal Every 3 Months     Evolocumab with Infusor ([place]) 420 MG/3.5ML SOCT Inject into the skin.     HYDROcodone-acetaminophen (NORCO) 10-325 MG per tablet Take 1 tablet by mouth every 4 (four) hours as needed. Reported on 07/05/2015     levothyroxine (SYNTHROID) 125 MCG tablet Take 125 mcg by mouth daily.     methocarbamol (ROBAXIN) 500 MG tablet Take 1 tablet (500 mg total) by mouth 4 (four) times daily. 28 tablet 0   metoprolol tartrate (LOPRESSOR) 25 MG tablet Take 25 mg by mouth 2 (two) times daily.     Omega-3 Fatty Acids (FISH OIL) 1000 MG CAPS Take by mouth.     oxyCODONE (OXY IR/ROXICODONE) 5 MG immediate release tablet 15 mg.      predniSONE (STERAPRED UNI-PAK 21 TAB) 10 MG (21) TBPK tablet Take by mouth as directed.     valsartan (DIOVAN) 160 MG tablet Take 160  mg by mouth daily.     No current facility-administered medications for this visit.    No Known Allergies   REVIEW OF SYSTEMS:  [X]  denotes positive finding, [ ]  denotes negative finding Cardiac  Comments:  Chest pain or chest pressure:    Shortness of breath upon exertion:    Short of breath when lying flat:    Irregular heart rhythm:        Vascular    Pain in calf, thigh, or hip brought on by ambulation:    Pain in feet at night that wakes you up from your sleep:     Blood clot in your veins:    Leg swelling:         Pulmonary    Oxygen at home:    Productive cough:     Wheezing:         Neurologic    Sudden weakness in arms or legs:     Sudden numbness in arms or legs:     Sudden onset of difficulty speaking or slurred speech:    Temporary loss of vision in one eye:     Problems with dizziness:         Gastrointestinal    Blood in stool:     Vomited blood:         Genitourinary    Burning when urinating:     Blood in urine:        Psychiatric    Major depression:         Hematologic    Bleeding problems:    Problems with blood clotting too easily:        Skin    Rashes or ulcers:        Constitutional    Fever or chills:      PHYSICAL EXAMINATION:  Vitals:   11/03/20 0938 11/03/20 0941  BP: (!) 143/81 133/82  Pulse: 63   Resp: 20   Temp: 98.1 F (36.7 C)   TempSrc: Temporal   SpO2: 95%   Weight: 122 lb 14.4 oz (55.7 kg)   Height: 5\' 1"  (1.549 m)     General:  WDWN in NAD; vital signs documented above Gait: Normal HENT: WNL, normocephalic Pulmonary: normal non-labored breathing , without wheezing Cardiac: regular HR, without  Murmurs without carotid bruit Abdomen: soft, NT, no masses Vascular Exam/Pulses: 2+ radial pulses bilaterally, 2+ femoral  and DP pulses bilaterally. Feet warm and well perfused Musculoskeletal: no muscle wasting or atrophy  Neurologic: A&O X 3;  No focal weakness or paresthesias are detected Psychiatric:  The pt  has Normal affect.   Non-Invasive Vascular Imaging:   Summary:  Right Carotid: Velocities in the right ICA are consistent with a 40-59% stenosis.   Left Carotid: Velocities in the left ICA are consistent with a 60-79% stenosis. The ECA appears >50% stenosed.   Vertebrals: Bilateral vertebral arteries demonstrate antegrade flow.   ASSESSMENT/PLAN:: 65 y.o. female here for follow up for carotid artery stenosis. She remains without any new neurological symptoms. Her duplex today shows essentially unchanged stenosis of bilateral carotid arteries. The right ICA has 40-59% stenosis, the left 60-79% stenosis. Bilateral vertebral arteries with antegrade flow and no stenosis of bilateral subclavian arteries - Continue Aspirin and Repatha - Discussed importance of adequate blood pressure control - Re discussed smoking cessation  - Reviewed signs and symptoms of TIA/ Stroke and she understands should these occur to call 911 or go to ER - She will follow up in 6 months with carotid duplex   Karoline Caldwell, PA-C [facility] [phone]  On call MD: Dr. Virl Cagey

## 2020-11-09 ENCOUNTER — Other Ambulatory Visit: Payer: Self-pay

## 2020-11-09 DIAGNOSIS — I6523 Occlusion and stenosis of bilateral carotid arteries: Secondary | ICD-10-CM

## 2020-11-10 DIAGNOSIS — G894 Chronic pain syndrome: Secondary | ICD-10-CM | POA: Diagnosis not present

## 2020-11-10 DIAGNOSIS — M159 Polyosteoarthritis, unspecified: Secondary | ICD-10-CM | POA: Diagnosis not present

## 2020-11-10 DIAGNOSIS — Z6823 Body mass index (BMI) 23.0-23.9, adult: Secondary | ICD-10-CM | POA: Diagnosis not present

## 2020-12-09 DIAGNOSIS — M5136 Other intervertebral disc degeneration, lumbar region: Secondary | ICD-10-CM | POA: Diagnosis not present

## 2020-12-09 DIAGNOSIS — Z6823 Body mass index (BMI) 23.0-23.9, adult: Secondary | ICD-10-CM | POA: Diagnosis not present

## 2020-12-09 DIAGNOSIS — G894 Chronic pain syndrome: Secondary | ICD-10-CM | POA: Diagnosis not present

## 2021-01-06 DIAGNOSIS — E271 Primary adrenocortical insufficiency: Secondary | ICD-10-CM | POA: Diagnosis not present

## 2021-01-06 DIAGNOSIS — E063 Autoimmune thyroiditis: Secondary | ICD-10-CM | POA: Diagnosis not present

## 2021-01-06 DIAGNOSIS — M5136 Other intervertebral disc degeneration, lumbar region: Secondary | ICD-10-CM | POA: Diagnosis not present

## 2021-01-06 DIAGNOSIS — G894 Chronic pain syndrome: Secondary | ICD-10-CM | POA: Diagnosis not present

## 2021-01-06 DIAGNOSIS — I10 Essential (primary) hypertension: Secondary | ICD-10-CM | POA: Diagnosis not present

## 2021-02-03 DIAGNOSIS — Z6823 Body mass index (BMI) 23.0-23.9, adult: Secondary | ICD-10-CM | POA: Diagnosis not present

## 2021-02-03 DIAGNOSIS — Z0001 Encounter for general adult medical examination with abnormal findings: Secondary | ICD-10-CM | POA: Diagnosis not present

## 2021-02-03 DIAGNOSIS — E271 Primary adrenocortical insufficiency: Secondary | ICD-10-CM | POA: Diagnosis not present

## 2021-02-03 DIAGNOSIS — B029 Zoster without complications: Secondary | ICD-10-CM | POA: Diagnosis not present

## 2021-02-03 DIAGNOSIS — G43909 Migraine, unspecified, not intractable, without status migrainosus: Secondary | ICD-10-CM | POA: Diagnosis not present

## 2021-02-03 DIAGNOSIS — M069 Rheumatoid arthritis, unspecified: Secondary | ICD-10-CM | POA: Diagnosis not present

## 2021-02-03 DIAGNOSIS — E782 Mixed hyperlipidemia: Secondary | ICD-10-CM | POA: Diagnosis not present

## 2021-02-03 DIAGNOSIS — E063 Autoimmune thyroiditis: Secondary | ICD-10-CM | POA: Diagnosis not present

## 2021-03-02 DIAGNOSIS — J849 Interstitial pulmonary disease, unspecified: Secondary | ICD-10-CM | POA: Diagnosis not present

## 2021-03-02 DIAGNOSIS — G894 Chronic pain syndrome: Secondary | ICD-10-CM | POA: Diagnosis not present

## 2021-03-02 DIAGNOSIS — Z0001 Encounter for general adult medical examination with abnormal findings: Secondary | ICD-10-CM | POA: Diagnosis not present

## 2021-03-02 DIAGNOSIS — M5136 Other intervertebral disc degeneration, lumbar region: Secondary | ICD-10-CM | POA: Diagnosis not present

## 2021-03-02 DIAGNOSIS — Z6823 Body mass index (BMI) 23.0-23.9, adult: Secondary | ICD-10-CM | POA: Diagnosis not present

## 2021-03-02 DIAGNOSIS — E063 Autoimmune thyroiditis: Secondary | ICD-10-CM | POA: Diagnosis not present

## 2021-03-15 ENCOUNTER — Encounter: Payer: Self-pay | Admitting: Pulmonary Disease

## 2021-03-15 ENCOUNTER — Ambulatory Visit: Payer: Medicare Other | Admitting: Pulmonary Disease

## 2021-03-15 ENCOUNTER — Other Ambulatory Visit: Payer: Self-pay

## 2021-03-15 VITALS — BP 130/68 | HR 67 | Temp 98.5°F | Ht 63.0 in | Wt 125.0 lb

## 2021-03-15 DIAGNOSIS — J849 Interstitial pulmonary disease, unspecified: Secondary | ICD-10-CM

## 2021-03-15 MED ORDER — FLUTICASONE FUROATE-VILANTEROL 200-25 MCG/ACT IN AEPB: 1.0000 | INHALATION_SPRAY | Freq: Every day | RESPIRATORY_TRACT | 2 refills | Status: AC

## 2021-03-15 MED ORDER — OMEPRAZOLE 40 MG PO CPDR: 40.0000 mg | DELAYED_RELEASE_CAPSULE | Freq: Every day | ORAL | 5 refills | Status: AC

## 2021-03-15 NOTE — Addendum Note (Signed)
Addended by: Elton Sin on: 03/15/2021 03:45 PM   Modules accepted: Orders

## 2021-03-15 NOTE — Patient Instructions (Signed)
We will send in a prescription for Breo 200.  He needs to use 1 puff every day for maximal effect We will schedule high-res CT and PFTs for better evaluation of the lung Follow-up in clinic in 2 to 3 months for reevaluation Continue to work on quitting smoking

## 2021-03-15 NOTE — Progress Notes (Signed)
Samantha Osborne    [id number]    [birth year]  Primary Care Physician:Fusco, Purcell Nails, MD  Referring Physician: [facility], [address] [place],  [place] [address]  Chief complaint: Follow up interstitial lung disease, emphysema, active smoker  HPI: Active smoker with history of hypertension, osteo-arthritis, DVT Referred for evaluation of abnormal CT scan  CT scan done in June 2020 for atypical chest pain.  Noted to have diffuse interstitial lung disease as noted below, atypical for UIP. Has small joint pain of the hands, morning stiffness, transient rash at the bottom of the legs.  Denies any difficulty swallowing.  Has history of obstructive sleep apnea diagnosed and 2017 but is noncompliant with CPAP.  Evaluated by Dr. Trudie Reed, rheumatology in 2019 and 2022 for elevated ANA.  This was not felt to be autoimmune.  She was given a diagnosis of osteoarthritis. Received note from Dr. Trudie Reed 03/21/2020 She has a diagnosis of osteoarthritis of the hands in general osteoarthritis.  No evidence of connective tissue disease Joint symptoms are stable.  Rheumatology has signed off.  She tried Symbicort in 2021 but stopped as it did not make any difference  Pets: Cat, no birds Occupation: Copywriter, advertising Exposures, interstitial lung disease questionarre  07/29/2018-Possible mold exposure at home.  No hot tubs, Jacuzzi.  She had received a dose of nitrofurantoin over 5 years ago Smoking history: 50-pack-year smoker.  Continues to smoke 1 pack/day Travel history: No significant problems history Relevant family history: No significant history of lung disease.  Sister has symptoms of rheumatoid arthritis as well.  Interim history: Complains of intermittent burning sensation in the chest with no obvious precipitating or relieving factors.  She has mild dyspnea on exertion with chest congestion. She plans to follow-up with cardiology to get checked out again  She was given  breo 100 by her primary care but is using it only intermittently Continues to smoke  Outpatient Encounter Medications as of 03/15/2021  Medication Sig   amLODipine (NORVASC) 5 MG tablet Take 5 mg by mouth daily.   aspirin EC 81 MG tablet Take 81 mg by mouth daily.   buPROPion (WELLBUTRIN SR) 150 MG 12 hr tablet Take 1 tablet by mouth daily.   butalbital-acetaminophen-caffeine (FIORICET, ESGIC) 50-325-40 MG tablet    celecoxib (CELEBREX) 100 MG capsule Take 100 mg by mouth 2 (two) times daily.   Estradiol (ESTRACE PO) Take by mouth.   ESTRING 2 MG vaginal ring SMARTSIG:Ring Vaginal Every 3 Months   Evolocumab with Infusor ([place]) 420 MG/3.5ML SOCT Inject into the skin.   fluticasone furoate-vilanterol (BREO ELLIPTA) 100-25 MCG/ACT AEPB Inhale 1 puff into the lungs daily. Takes as needed   HYDROcodone-acetaminophen (NORCO) 10-325 MG per tablet Take 1 tablet by mouth every 4 (four) hours as needed. Reported on 07/05/2015   levothyroxine (SYNTHROID) 125 MCG tablet Take 125 mcg by mouth daily.   methocarbamol (ROBAXIN) 500 MG tablet Take 1 tablet (500 mg total) by mouth 4 (four) times daily.   metoprolol tartrate (LOPRESSOR) 25 MG tablet Take 25 mg by mouth 2 (two) times daily.   Omega-3 Fatty Acids (FISH OIL) 1000 MG CAPS Take by mouth.   oxyCODONE (OXY IR/ROXICODONE) 5 MG immediate release tablet 15 mg.    valsartan (DIOVAN) 160 MG tablet Take 160 mg by mouth daily.   [DISCONTINUED] predniSONE (STERAPRED UNI-PAK 21 TAB) 10 MG (21) TBPK tablet Take by mouth as directed.   No facility-administered encounter medications on  file as of 03/15/2021.   Physical Exam: Blood pressure 130/68, pulse 67, temperature 98.5 F (36.9 C), temperature source Oral, height 5\' 3"  (1.6 m), weight 125 lb (56.7 kg), SpO2 97 %. Gen:      No acute distress HEENT:  EOMI, sclera anicteric Neck:     No masses; no thyromegaly Lungs:    Clear to auscultation bilaterally; normal respiratory effort CV:          Regular rate and rhythm; no murmurs Abd:      + bowel sounds; soft, non-tender; no palpable masses, no distension Ext:    No edema; adequate peripheral perfusion Skin:      Warm and dry; no rash Neuro: alert and oriented x 3 Psych: normal mood and affect   Data Reviewed: Imaging: CT chest 06/25/2018- enlarged mediastinal lymph nodes.  Diffuse groundglass opacities, mild traction bronchiectasis without basilar gradient.  Indeterminate for UIP.    CTA 04/12/2019-diffuse groundglass opacities with mild traction bronchiectasis.  Emphysema.  High-resolution CT 04/29/2020-stable groundglass with mild traction bronchiectasis, emphysema.  Unchanged compared to 2021.  Labs: ANA 07/29/2018-1:320, nuclear, homogeneous Negative myositis, hypersensitivity panel, SCL 70 and Sjogren's antibodies.  PFTs: 05/14/2019 FVC 2.25 [90%], FEV1 2.06 [92%], F/F 91, TLC 3.45 [73%], DLCO 10.19 [55%] Mild restriction with moderate diffusion defect.  Slightly worse compared to 2020.  6-minute walk test 05/19/2019-476 m, nadir O2 sat 96%.  Assessment:  Follow-up for interstitial lung disease CT reviewed with nonspecific pattern of fibrosis, groundglass attenuation.  Differential diagnosis is RB ILD versus IPAF given elevated ANA but had a rheumatology evaluation and was told there is no evidence of autoimmune condition and rheumatology has signed off. Has some mold exposure lin 2020 but house is clean and there is no ongoing issues.  Discussed further work-up including surgical lung biopsy versus bronchoscopy but patient does not want to proceed at this time. Told her that she really needs to quit smoking as this could be smoking-related ILD.  She will need reassessment with a high-res CT and PFTs  Burning sensation in the chest Not sure if symptoms are coming from the lungs She plans to follow-up with cardiology Review of CT and PFTs as above Try Prilosec 40 mg a day  Active smoker Currently on Wellbutrin,  nicotine Had headache with Chantix. Smoking cessation discussed again with patient.  Reassess at return visit.  Time spent counseling-5 minutes  Emphysema No obstruction noted on PFTs She is on Breo but is using it intermittently.  Told her to start using it daily for maximal effect  Plan/Recommendations: - High-res CT, PFTs - Prilosec - Smoking cessation  [name] MD [facility] 03/15/2021, 3:08 PM  CC: [facility], MD

## 2021-03-20 ENCOUNTER — Telehealth: Payer: Self-pay | Admitting: Cardiology

## 2021-03-20 NOTE — Telephone Encounter (Signed)
Pt c/o of Chest Pain: STAT if CP now or developed within 24 hours  1. Are you having CP right now? no  2. Are you experiencing any other symptoms (ex. SOB, nausea, vomiting, sweating)?   3. How long have you been experiencing CP? About a week and a half  4. Is your CP continuous or coming and going? Comes and goes   5. Have you taken Nitroglycerin? no ? Patient said she had some intermittent chest pain that is in the front of her chest on both sides and occasionally goes down her left arm or radiates into her neck. Her pain never lasts long. She said there is nothing in particular that triggers the symptoms

## 2021-03-20 NOTE — Telephone Encounter (Signed)
Are there any PA appointments this week. If severe progressing symptoms needs ER evaluation  Zandra Abts MD

## 2021-03-20 NOTE — Telephone Encounter (Signed)
Pt states that for the last 1-2 weeks she has been having chest burning that radiates to her left arm and neck. Pt does c/o nausea and vomiting only once. When these episodes happen they last 5-6 min, they happen when she is moving and better when she relaxes. Was given Omeprazole by pulmonary with no help. Please advise.

## 2021-03-20 NOTE — Telephone Encounter (Signed)
Pt notified and appt scheduled for Thur. With Dr. Harl Bowie.

## 2021-03-22 ENCOUNTER — Telehealth: Payer: Self-pay | Admitting: Pulmonary Disease

## 2021-03-22 NOTE — Telephone Encounter (Signed)
Spoke with pt and verified CT would be done at [facility]. Pt stated understanding. Nothing further needed at this time.  ?

## 2021-03-23 ENCOUNTER — Ambulatory Visit: Payer: Medicare Other | Admitting: Cardiology

## 2021-03-23 ENCOUNTER — Encounter: Payer: Self-pay | Admitting: *Deleted

## 2021-03-23 ENCOUNTER — Encounter: Payer: Self-pay | Admitting: Cardiology

## 2021-03-23 VITALS — BP 136/90 | HR 76 | Ht 63.0 in | Wt 128.0 lb

## 2021-03-23 DIAGNOSIS — Z01812 Encounter for preprocedural laboratory examination: Secondary | ICD-10-CM

## 2021-03-23 DIAGNOSIS — I10 Essential (primary) hypertension: Secondary | ICD-10-CM | POA: Diagnosis not present

## 2021-03-23 DIAGNOSIS — R0789 Other chest pain: Secondary | ICD-10-CM | POA: Diagnosis not present

## 2021-03-23 MED ORDER — NITROGLYCERIN 0.4 MG SL SUBL: 0.4000 mg | SUBLINGUAL_TABLET | SUBLINGUAL | 3 refills | Status: AC | PRN

## 2021-03-23 MED ORDER — METOPROLOL TARTRATE 100 MG PO TABS: 100.0000 mg | ORAL_TABLET | Freq: Once | ORAL | 0 refills | Status: DC

## 2021-03-23 NOTE — Addendum Note (Signed)
Addended by: Laurine Blazer on: 03/23/2021 10:18 AM ? ? Modules accepted: Orders ? ?

## 2021-03-23 NOTE — Progress Notes (Signed)
Clinical Summary Samantha Osborne is a 66 y.o.female seen today for follow up of the following medical problems.      1. Prior history of chest pain  -  admit [facility] 02/2015. Negative workup for ACS, echo showed LVEF 81-44%, grade I diastolic dysfunction, no WMAs - she was referred for outpatient stress test. Nuclear stress 03/2015 showed no ischemia, LVEF 30-44%   - no recent chest pains    - phone call 03/20/21 with chest pain - symptoms started about 4 weeks ago - burning/aching right and left chest, 8/10 in severity. Typically comes on with activity. No other associated symptoms. Radiates left neck into left face, down left arm. Not positional. Pain lasts up to 5 minutes.  - episodes sporadic, occurs about 2 times a week - just started prilosec, has not helped yet - has had some vomiting over the last month, not related to pain - lexiscan caused "panic attack"     Other medical problems not addressed this visit   2. Carotid stenosis - followed by vascular - 08/1854 RIC 31-49, LICA 70-26     3. Hyperlipidemia - statin intolerance. She reports intolerance to zetia as well   - taking repatha, tolerating well   4. Palpitations - high stress at home caring for her elderly father - started about 2-3 weeks ago. Fluttering midchest, she listened to her heart with a stethoscope and sounded like irregular beats - can last several hours. +fatigue - 1 cup of coffee in AM, noncaffeinated. No EtoH - symptoms occur few times a week.   - event monitor with just occasional PACs , short run NSVT   - no recent palpitations   5. ILD - followed by pulmonary     6. HTN - compliant with meds   7. Bicuspid AV - noted by 02/2018 echo, very mild stenosis - she reports her mother had issues with her aortic valve and aortic aneurysm in her 46s, unclear if she may have had a bicuspid valve.    - 03/2019 CTA normal aorta     SH: father passed recently at 75        Past Medical  History:  Diagnosis Date   Carotid artery occlusion    DDD (degenerative disc disease)    Depression    DVT (deep venous thrombosis) ([place]) 2008   right leg   Hypertension    Thyroid disease      No Known Allergies   Current Outpatient Medications  Medication Sig Dispense Refill   amLODipine (NORVASC) 5 MG tablet Take 5 mg by mouth daily.     aspirin EC 81 MG tablet Take 81 mg by mouth daily.     buPROPion (WELLBUTRIN SR) 150 MG 12 hr tablet Take 1 tablet by mouth daily.     butalbital-acetaminophen-caffeine (FIORICET, ESGIC) 50-325-40 MG tablet      celecoxib (CELEBREX) 100 MG capsule Take 100 mg by mouth 2 (two) times daily.     Estradiol (ESTRACE PO) Take by mouth.     ESTRING 2 MG vaginal ring SMARTSIG:Ring Vaginal Every 3 Months     Evolocumab with Infusor ([place]) 420 MG/3.5ML SOCT Inject into the skin.     fluticasone furoate-vilanterol (BREO ELLIPTA) 200-25 MCG/ACT AEPB Inhale 1 puff into the lungs daily. 60 each 2   HYDROcodone-acetaminophen (NORCO) 10-325 MG per tablet Take 1 tablet by mouth every 4 (four) hours as needed. Reported on 07/05/2015     levothyroxine (SYNTHROID) 125  MCG tablet Take 125 mcg by mouth daily.     methocarbamol (ROBAXIN) 500 MG tablet Take 1 tablet (500 mg total) by mouth 4 (four) times daily. 28 tablet 0   metoprolol tartrate (LOPRESSOR) 25 MG tablet Take 25 mg by mouth 2 (two) times daily.     Omega-3 Fatty Acids (FISH OIL) 1000 MG CAPS Take by mouth.     omeprazole (PRILOSEC) 40 MG capsule Take 1 capsule (40 mg total) by mouth daily. 30 capsule 5   oxyCODONE (OXY IR/ROXICODONE) 5 MG immediate release tablet 15 mg.      valsartan (DIOVAN) 160 MG tablet Take 160 mg by mouth daily.     No current facility-administered medications for this visit.     Past Surgical History:  Procedure Laterality Date   CESAREAN SECTION     DILATION AND CURETTAGE OF UTERUS     SHOULDER ARTHROSCOPY Left 2005     No Known  Allergies    Family History  Problem Relation Age of Onset   Heart disease Mother    Hyperlipidemia Mother    Heart attack Mother    AAA (abdominal aortic aneurysm) Mother    Cancer Father    Heart disease Brother    Hypertension Brother    Hypertension Son      Social History Ms. Gatchell reports that she has been smoking cigarettes. She started smoking about 51 years ago. She has been smoking an average of .75 packs per day. She has been exposed to tobacco smoke. She has never used smokeless tobacco. Ms. Cudney reports no history of alcohol use.   Review of Systems CONSTITUTIONAL: No weight loss, fever, chills, weakness or fatigue.  HEENT: Eyes: No visual loss, blurred vision, double vision or yellow sclerae.No hearing loss, sneezing, congestion, runny nose or sore throat.  SKIN: No rash or itching.  CARDIOVASCULAR: per hpi RESPIRATORY: No shortness of breath, cough or sputum.  GASTROINTESTINAL: No anorexia, nausea, vomiting or diarrhea. No abdominal pain or blood.  GENITOURINARY: No burning on urination, no polyuria NEUROLOGICAL: No headache, dizziness, syncope, paralysis, ataxia, numbness or tingling in the extremities. No change in bowel or bladder control.  MUSCULOSKELETAL: No muscle, back pain, joint pain or stiffness.  LYMPHATICS: No enlarged nodes. No history of splenectomy.  PSYCHIATRIC: No history of depression or anxiety.  ENDOCRINOLOGIC: No reports of sweating, cold or heat intolerance. No polyuria or polydipsia.  Marland Kitchen   Physical Examination Today's Vitals   03/23/21 0850  BP: 136/90  Pulse: 76  SpO2: 97%  Weight: 128 lb (58.1 kg)  Height: 5\' 3"  (1.6 m)   Body mass index is 22.67 kg/m.  Gen: resting comfortably, no acute distress HEENT: no scleral icterus, pupils equal round and reactive, no palptable cervical adenopathy,  CV: RRR, no m/r/g no jvd Resp: Clear to auscultation bilaterally GI: abdomen is soft, non-tender, non-distended, normal bowel sounds,  no hepatosplenomegaly MSK: extremities are warm, no edema.  Skin: warm, no rash Neuro:  no focal deficits Psych: appropriate affect   Diagnostic Studies  02/2015 echo Study Conclusions  - Left ventricle: The cavity size was normal. Wall thickness was   increased in a pattern of mild LVH. Systolic function was normal.   The estimated ejection fraction was in the range of 60% to 65%.   Wall motion was normal; there were no regional wall motion   abnormalities. Doppler parameters are consistent with abnormal   left ventricular relaxation (grade 1 diastolic dysfunction). - Aortic valve: Mildly to moderately calcified  annulus. Mildly   thickened leaflets. - Mitral valve: Calcified annulus. Normal thickness leaflets . - Tricuspid valve: There was mild regurgitation.   03/2015 Nuclear stress test There was no ST segment deviation noted during stress. The study is normal. There are no perfusion defects consistent with prior infarct or current ischemia. This is an intermediate risk study. Intermediate risk based on low calculated LVEF. Visually the LVEF looks normal, consider correlation with echo. There is no myocardium at jeopardy. The left ventricular ejection fraction is moderately decreased (30-44%).     02/2018 14 day event monitor 14 day event monitor Min HR 49, Max HR 170, Avg HR 74 Rare supraventricular ectopy (<1%). Isolated PACs, couplets, occasional runs of atach longest 28 beats Rare ventricular ectopy (<1%), one 6 beat run of NSVT Symptoms correlated with sinus rhythm     03/2019 CTA chest   IMPRESSION: Negative for aortic dissection or aneurysm. No acute abnormality chest, abdomen or pelvis.   Extensive calcific atherosclerosis including involvement of the coronary arteries.   No change in abnormal appearance of the lungs consistent with interstitial lung disease such as NSIP.   Assessment and Plan  1. Chest pain - recent chest pain symptoms mixed in description  as far as possible cardiac cause - she report lexiscan in the past causes feeling of a panic attack. She is not sure can get HR high enough with exercise alone. Will plan for coronary CTA to further evaluate - given SL NG prn - EKG today show SR, no ischeminc changes  2. Bicuspid AV - repeat echo later this year, prior echo very mild stenosis.       [name], M.D.

## 2021-03-23 NOTE — Patient Instructions (Addendum)
Medication Instructions:  ?Begin Nitroglycerin as needed for chest pain  ?Continue all current medications. ? ?Labwork: ?BMET - order given today ?Please do just prior to the CT ? ?Testing/Procedures: ?Coronary CTA ?Office will contact with results via phone or letter.    ? ?Follow-Up: ?4 months  ? ?Any Other Special Instructions Will Be Listed Below (If Applicable). ? ? ?If you need a refill on your cardiac medications before your next appointment, please call your pharmacy. ? ?

## 2021-03-30 DIAGNOSIS — I6523 Occlusion and stenosis of bilateral carotid arteries: Secondary | ICD-10-CM | POA: Diagnosis not present

## 2021-03-30 DIAGNOSIS — E063 Autoimmune thyroiditis: Secondary | ICD-10-CM | POA: Diagnosis not present

## 2021-03-30 DIAGNOSIS — I208 Other forms of angina pectoris: Secondary | ICD-10-CM | POA: Diagnosis not present

## 2021-03-30 DIAGNOSIS — M5136 Other intervertebral disc degeneration, lumbar region: Secondary | ICD-10-CM | POA: Diagnosis not present

## 2021-03-30 DIAGNOSIS — M069 Rheumatoid arthritis, unspecified: Secondary | ICD-10-CM | POA: Diagnosis not present

## 2021-03-30 DIAGNOSIS — Z6823 Body mass index (BMI) 23.0-23.9, adult: Secondary | ICD-10-CM | POA: Diagnosis not present

## 2021-03-30 DIAGNOSIS — K219 Gastro-esophageal reflux disease without esophagitis: Secondary | ICD-10-CM | POA: Diagnosis not present

## 2021-03-30 DIAGNOSIS — E271 Primary adrenocortical insufficiency: Secondary | ICD-10-CM | POA: Diagnosis not present

## 2021-03-30 DIAGNOSIS — Z01812 Encounter for preprocedural laboratory examination: Secondary | ICD-10-CM | POA: Diagnosis not present

## 2021-03-30 DIAGNOSIS — I10 Essential (primary) hypertension: Secondary | ICD-10-CM | POA: Diagnosis not present

## 2021-03-31 LAB — BASIC METABOLIC PANEL
BUN/Creatinine Ratio: 15 (ref 12–28)
BUN: 24 mg/dL (ref 8–27)
CO2: 22 mmol/L (ref 20–29)
Calcium: 9.5 mg/dL (ref 8.7–10.3)
Chloride: 102 mmol/L (ref 96–106)
Creatinine, Ser: 1.57 mg/dL — ABNORMAL HIGH (ref 0.57–1.00)
Glucose: 114 mg/dL — ABNORMAL HIGH (ref 70–99)
Potassium: 4.8 mmol/L (ref 3.5–5.2)
Sodium: 137 mmol/L (ref 134–144)
eGFR: 36 mL/min/{1.73_m2} — ABNORMAL LOW (ref >59)

## 2021-04-01 ENCOUNTER — Other Ambulatory Visit: Payer: Self-pay

## 2021-04-01 ENCOUNTER — Encounter (HOSPITAL_COMMUNITY): Payer: Self-pay | Admitting: Emergency Medicine

## 2021-04-01 ENCOUNTER — Emergency Department (HOSPITAL_COMMUNITY): Payer: Medicare Other

## 2021-04-01 ENCOUNTER — Inpatient Hospital Stay (HOSPITAL_COMMUNITY)
Admission: EM | Admit: 2021-04-01 | Discharge: 2021-04-10 | DRG: 234 | Disposition: A | Discharge status: Home / Self Care | Payer: Medicare Other | Attending: Cardiothoracic Surgery | Admitting: Cardiothoracic Surgery

## 2021-04-01 DIAGNOSIS — N184 Chronic kidney disease, stage 4 (severe): Secondary | ICD-10-CM | POA: Diagnosis not present

## 2021-04-01 DIAGNOSIS — J439 Emphysema, unspecified: Secondary | ICD-10-CM | POA: Diagnosis not present

## 2021-04-01 DIAGNOSIS — I350 Nonrheumatic aortic (valve) stenosis: Secondary | ICD-10-CM | POA: Diagnosis not present

## 2021-04-01 DIAGNOSIS — Z7951 Long term (current) use of inhaled steroids: Secondary | ICD-10-CM

## 2021-04-01 DIAGNOSIS — I249 Acute ischemic heart disease, unspecified: Secondary | ICD-10-CM | POA: Diagnosis not present

## 2021-04-01 DIAGNOSIS — F32A Depression, unspecified: Secondary | ICD-10-CM | POA: Diagnosis present

## 2021-04-01 DIAGNOSIS — R0789 Other chest pain: Secondary | ICD-10-CM | POA: Diagnosis not present

## 2021-04-01 DIAGNOSIS — Q231 Congenital insufficiency of aortic valve: Secondary | ICD-10-CM

## 2021-04-01 DIAGNOSIS — D631 Anemia in chronic kidney disease: Secondary | ICD-10-CM | POA: Diagnosis not present

## 2021-04-01 DIAGNOSIS — Z8249 Family history of ischemic heart disease and other diseases of the circulatory system: Secondary | ICD-10-CM

## 2021-04-01 DIAGNOSIS — D6959 Other secondary thrombocytopenia: Secondary | ICD-10-CM | POA: Diagnosis not present

## 2021-04-01 DIAGNOSIS — I081 Rheumatic disorders of both mitral and tricuspid valves: Secondary | ICD-10-CM | POA: Diagnosis not present

## 2021-04-01 DIAGNOSIS — Z7982 Long term (current) use of aspirin: Secondary | ICD-10-CM

## 2021-04-01 DIAGNOSIS — J9811 Atelectasis: Secondary | ICD-10-CM | POA: Diagnosis present

## 2021-04-01 DIAGNOSIS — D696 Thrombocytopenia, unspecified: Secondary | ICD-10-CM | POA: Diagnosis not present

## 2021-04-01 DIAGNOSIS — Z79620 Long term (current) use of immunosuppressive biologic: Secondary | ICD-10-CM

## 2021-04-01 DIAGNOSIS — I25110 Atherosclerotic heart disease of native coronary artery with unstable angina pectoris: Principal | ICD-10-CM | POA: Diagnosis present

## 2021-04-01 DIAGNOSIS — N289 Disorder of kidney and ureter, unspecified: Secondary | ICD-10-CM | POA: Diagnosis not present

## 2021-04-01 DIAGNOSIS — D62 Acute posthemorrhagic anemia: Secondary | ICD-10-CM | POA: Diagnosis not present

## 2021-04-01 DIAGNOSIS — M069 Rheumatoid arthritis, unspecified: Secondary | ICD-10-CM | POA: Diagnosis present

## 2021-04-01 DIAGNOSIS — D649 Anemia, unspecified: Secondary | ICD-10-CM | POA: Diagnosis not present

## 2021-04-01 DIAGNOSIS — I200 Unstable angina: Secondary | ICD-10-CM | POA: Diagnosis not present

## 2021-04-01 DIAGNOSIS — Z79899 Other long term (current) drug therapy: Secondary | ICD-10-CM

## 2021-04-01 DIAGNOSIS — Z9889 Other specified postprocedural states: Secondary | ICD-10-CM | POA: Diagnosis not present

## 2021-04-01 DIAGNOSIS — R079 Chest pain, unspecified: Secondary | ICD-10-CM | POA: Diagnosis not present

## 2021-04-01 DIAGNOSIS — R778 Other specified abnormalities of plasma proteins: Secondary | ICD-10-CM | POA: Diagnosis not present

## 2021-04-01 DIAGNOSIS — G4730 Sleep apnea, unspecified: Secondary | ICD-10-CM | POA: Diagnosis not present

## 2021-04-01 DIAGNOSIS — J90 Pleural effusion, not elsewhere classified: Secondary | ICD-10-CM | POA: Diagnosis not present

## 2021-04-01 DIAGNOSIS — M549 Dorsalgia, unspecified: Secondary | ICD-10-CM | POA: Diagnosis present

## 2021-04-01 DIAGNOSIS — Z01810 Encounter for preprocedural cardiovascular examination: Secondary | ICD-10-CM | POA: Diagnosis not present

## 2021-04-01 DIAGNOSIS — Z20822 Contact with and (suspected) exposure to covid-19: Secondary | ICD-10-CM | POA: Diagnosis present

## 2021-04-01 DIAGNOSIS — I129 Hypertensive chronic kidney disease with stage 1 through stage 4 chronic kidney disease, or unspecified chronic kidney disease: Secondary | ICD-10-CM | POA: Diagnosis not present

## 2021-04-01 DIAGNOSIS — Z86718 Personal history of other venous thrombosis and embolism: Secondary | ICD-10-CM

## 2021-04-01 DIAGNOSIS — G894 Chronic pain syndrome: Secondary | ICD-10-CM | POA: Diagnosis not present

## 2021-04-01 DIAGNOSIS — E8770 Fluid overload, unspecified: Secondary | ICD-10-CM | POA: Diagnosis not present

## 2021-04-01 DIAGNOSIS — I6523 Occlusion and stenosis of bilateral carotid arteries: Secondary | ICD-10-CM | POA: Diagnosis not present

## 2021-04-01 DIAGNOSIS — F17210 Nicotine dependence, cigarettes, uncomplicated: Secondary | ICD-10-CM | POA: Diagnosis not present

## 2021-04-01 DIAGNOSIS — J849 Interstitial pulmonary disease, unspecified: Secondary | ICD-10-CM | POA: Diagnosis not present

## 2021-04-01 DIAGNOSIS — M1990 Unspecified osteoarthritis, unspecified site: Secondary | ICD-10-CM | POA: Diagnosis not present

## 2021-04-01 DIAGNOSIS — K5900 Constipation, unspecified: Secondary | ICD-10-CM | POA: Diagnosis present

## 2021-04-01 DIAGNOSIS — I10 Essential (primary) hypertension: Secondary | ICD-10-CM | POA: Diagnosis not present

## 2021-04-01 DIAGNOSIS — N1832 Chronic kidney disease, stage 3b: Secondary | ICD-10-CM | POA: Diagnosis not present

## 2021-04-01 DIAGNOSIS — E785 Hyperlipidemia, unspecified: Secondary | ICD-10-CM | POA: Diagnosis not present

## 2021-04-01 DIAGNOSIS — I480 Paroxysmal atrial fibrillation: Secondary | ICD-10-CM | POA: Diagnosis present

## 2021-04-01 DIAGNOSIS — K219 Gastro-esophageal reflux disease without esophagitis: Secondary | ICD-10-CM

## 2021-04-01 DIAGNOSIS — E039 Hypothyroidism, unspecified: Secondary | ICD-10-CM

## 2021-04-01 DIAGNOSIS — I214 Non-ST elevation (NSTEMI) myocardial infarction: Secondary | ICD-10-CM | POA: Diagnosis not present

## 2021-04-01 DIAGNOSIS — R918 Other nonspecific abnormal finding of lung field: Secondary | ICD-10-CM | POA: Diagnosis not present

## 2021-04-01 DIAGNOSIS — F419 Anxiety disorder, unspecified: Secondary | ICD-10-CM | POA: Diagnosis present

## 2021-04-01 DIAGNOSIS — N183 Chronic kidney disease, stage 3 unspecified: Secondary | ICD-10-CM | POA: Diagnosis not present

## 2021-04-01 DIAGNOSIS — I2510 Atherosclerotic heart disease of native coronary artery without angina pectoris: Secondary | ICD-10-CM | POA: Diagnosis not present

## 2021-04-01 DIAGNOSIS — Z79891 Long term (current) use of opiate analgesic: Secondary | ICD-10-CM

## 2021-04-01 DIAGNOSIS — J811 Chronic pulmonary edema: Secondary | ICD-10-CM | POA: Diagnosis not present

## 2021-04-01 DIAGNOSIS — Z634 Disappearance and death of family member: Secondary | ICD-10-CM

## 2021-04-01 DIAGNOSIS — Z951 Presence of aortocoronary bypass graft: Secondary | ICD-10-CM | POA: Diagnosis not present

## 2021-04-01 DIAGNOSIS — D72829 Elevated white blood cell count, unspecified: Secondary | ICD-10-CM | POA: Diagnosis not present

## 2021-04-01 DIAGNOSIS — Z79890 Hormone replacement therapy (postmenopausal): Secondary | ICD-10-CM

## 2021-04-01 DIAGNOSIS — Z83438 Family history of other disorder of lipoprotein metabolism and other lipidemia: Secondary | ICD-10-CM

## 2021-04-01 LAB — CBC
HCT: 36.8 % (ref 36.0–46.0)
Hemoglobin: 11.9 g/dL — ABNORMAL LOW (ref 12.0–15.0)
MCH: 32.6 pg (ref 26.0–34.0)
MCHC: 32.3 g/dL (ref 30.0–36.0)
MCV: 100.8 fL — ABNORMAL HIGH (ref 80.0–100.0)
Platelets: 236 10*3/uL (ref 150–400)
RBC: 3.65 MIL/uL — ABNORMAL LOW (ref 3.87–5.11)
RDW: 13.2 % (ref 11.5–15.5)
WBC: 11.9 10*3/uL — ABNORMAL HIGH (ref 4.0–10.5)
nRBC: 0 % (ref 0.0–0.2)

## 2021-04-01 LAB — BASIC METABOLIC PANEL
Anion gap: 7 (ref 5–15)
BUN: 28 mg/dL — ABNORMAL HIGH (ref 8–23)
CO2: 24 mmol/L (ref 22–32)
Calcium: 8.5 mg/dL — ABNORMAL LOW (ref 8.9–10.3)
Chloride: 105 mmol/L (ref 98–111)
Creatinine, Ser: 1.66 mg/dL — ABNORMAL HIGH (ref 0.44–1.00)
GFR, Estimated: 34 mL/min — ABNORMAL LOW (ref >60)
Glucose, Bld: 118 mg/dL — ABNORMAL HIGH (ref 70–99)
Potassium: 4.7 mmol/L (ref 3.5–5.1)
Sodium: 136 mmol/L (ref 135–145)

## 2021-04-01 LAB — TROPONIN I (HIGH SENSITIVITY)
Troponin I (High Sensitivity): 21 ng/L — ABNORMAL HIGH (ref <18)
Troponin I (High Sensitivity): 26 ng/L — ABNORMAL HIGH (ref <18)

## 2021-04-01 MED ORDER — NITROGLYCERIN 0.4 MG SL SUBL
0.4000 mg | SUBLINGUAL_TABLET | SUBLINGUAL | Status: DC | PRN
Start: 2021-04-01 — End: 2021-04-05
  Administered 2021-04-04: 0.4 mg via SUBLINGUAL
  Filled 2021-04-01 (×2): qty 1

## 2021-04-01 MED ORDER — HYDROCODONE-ACETAMINOPHEN 5-325 MG PO TABS
2.0000 | ORAL_TABLET | Freq: Once | ORAL | Status: AC
  Administered 2021-04-01: 2 via ORAL
  Filled 2021-04-01: qty 2

## 2021-04-01 MED ORDER — OXYCODONE HCL 5 MG PO TABS
5.0000 mg | ORAL_TABLET | Freq: Once | ORAL | Status: AC
  Administered 2021-04-01: 5 mg via ORAL
  Filled 2021-04-01: qty 1

## 2021-04-01 NOTE — H&P (Addendum)
History and Physical    Patient: Samantha Osborne RTM:[id number] DOB: 28-Oct-1955 DOA: 04/01/2021 DOS: the patient was seen and examined on 04/02/2021 PCP: Redmond School, MD  Patient coming from: Home  Chief Complaint:  Chief Complaint  Patient presents with   Chest Pain   HPI: Samantha Osborne is a 66 y.o. female with medical history significant of essential hypertension, hyperlipidemia, hypothyroidism, GERD who presents to the emergency department due to 1 month of intermittent left-sided chest pain and she was already scheduled for a coronary CT on Tuesday by Dr. Harl Bowie (per ED medical record).  She complained of midsternal chest pain with radiation to collarbone, facial area, neck and left arm which was different from prior chest pain she has been having, pain was described as dull and achy and was rated as 8/10 on pain scale.  Patient endorsed taking up to 4 sublingual nitroglycerin prior to sensing any relief.  Pain worsens with ambulation and improves with rest.  She decided to go to the ED for further evaluation and management.  Patient denies fever, chills, shortness of breath, nausea, vomiting.  ED course: In the emergency department, she was hemodynamically stable, BP was soft at 108/59 and other vital signs were within normal range.  Work-up in the ED showed mild leukocytosis, MCV 100.8, BUN/creatinine 20/1.66 (baseline creatinine at 1.3-1.5).  Troponin x2 - 21 > 26.  Influenza A, B, SARS coronavirus 2 was negative. Chest x-ray showed no evidence of acute cardiopulmonary disease She was treated with Norco and oxycodone.  Cardiology fellow [facility] (Dr. Humphrey Rolls) was consulted and recommended admitting patient to [facility] with plan to follow-up with patient in the morning by the cardiology team per ED PA.  Hospitalist was asked to admit patient for further evaluation and management.   Review of Systems: As mentioned in the history of present illness. All other systems reviewed and are  negative.  Past Medical History:  Diagnosis Date   Carotid artery occlusion    DDD (degenerative disc disease)    Depression    DVT (deep venous thrombosis) ([place]) 2008   right leg   Hypertension    Thyroid disease    Past Surgical History:  Procedure Laterality Date   CESAREAN SECTION     DILATION AND CURETTAGE OF UTERUS     SHOULDER ARTHROSCOPY Left 2005   Social History:  reports that she has been smoking cigarettes. She started smoking about 51 years ago. She has been smoking an average of .75 packs per day. She has been exposed to tobacco smoke. She has never used smokeless tobacco. She reports that she does not drink alcohol and does not use drugs.  No Known Allergies  Family History  Problem Relation Age of Onset   Heart disease Mother    Hyperlipidemia Mother    Heart attack Mother    AAA (abdominal aortic aneurysm) Mother    Cancer Father    Heart disease Brother    Hypertension Brother    Hypertension Son     Prior to Admission medications   Medication Sig Start Date End Date Taking? Authorizing Provider  albuterol (VENTOLIN HFA) 108 (90 Base) MCG/ACT inhaler Inhale 2 puffs into the lungs daily as needed for shortness of breath. 01/06/21  Yes [provider]  amLODipine (NORVASC) 5 MG tablet Take 5 mg by mouth daily.   Yes [provider]  aspirin EC 81 MG tablet Take 81 mg by mouth daily.   Yes [provider]  buPROPion (  WELLBUTRIN SR) 150 MG 12 hr tablet Take 1 tablet by mouth daily. 02/15/15  Yes [provider]  butalbital-acetaminophen-caffeine (FIORICET, ESGIC) 50-325-40 MG tablet Take 1 tablet by mouth every 4 (four) hours as needed. 03/22/15  Yes [provider]  celecoxib (CELEBREX) 100 MG capsule Take 100 mg by mouth 2 (two) times daily as needed. 06/05/19  Yes [provider]  Evolocumab with Infusor ([place]) 420 MG/3.5ML SOCT Inject 420 mg into the skin every 14 (fourteen) days.   Yes  [provider]  fluticasone furoate-vilanterol (BREO ELLIPTA) 200-25 MCG/ACT AEPB Inhale 1 puff into the lungs daily. 03/15/21  Yes Mannam, Praveen, MD  HYDROcodone-acetaminophen (NORCO) 10-325 MG per tablet Take 1 tablet by mouth every 4 (four) hours as needed for moderate pain. Reported on 07/05/2015 09/07/13  Yes [provider]  levothyroxine (SYNTHROID) 150 MCG tablet Take 150 mcg by mouth daily. 03/06/21  Yes [provider]  methocarbamol (ROBAXIN) 500 MG tablet Take 1 tablet (500 mg total) by mouth 4 (four) times daily. 09/11/13  Yes [name], PA-C  metoprolol tartrate (LOPRESSOR) 25 MG tablet Take 25 mg by mouth 2 (two) times daily. 06/05/19  Yes [provider]  nitroGLYCERIN (NITROSTAT) 0.4 MG SL tablet Place 1 tablet (0.4 mg total) under the tongue every 5 (five) minutes as needed for chest pain. 03/23/21 06/21/21 Yes Branch, Alphonse Guild, MD  omeprazole (PRILOSEC) 40 MG capsule Take 1 capsule (40 mg total) by mouth daily. 03/15/21  Yes Mannam, Praveen, MD  oxyCODONE (OXY IR/ROXICODONE) 5 MG immediate release tablet Take 15 mg by mouth every 4 (four) hours as needed for severe pain. 06/15/15  Yes [provider]  valACYclovir (VALTREX) 1000 MG tablet Take 1,000 mg by mouth daily. 02/03/21  Yes [provider]  valsartan (DIOVAN) 160 MG tablet Take 160 mg by mouth daily. 02/05/20  Yes [provider]  metoprolol tartrate (LOPRESSOR) 100 MG tablet Take 1 tablet (100 mg total) by mouth once for 1 dose. Patient not taking: Reported on 04/01/2021 03/23/21 03/23/21  [name], MD    Physical Exam: Vitals:   04/01/21 2200 04/01/21 2230 04/02/21 0100 04/02/21 0330  BP: (!) 108/59 (!) 116/91 109/69 (!) 95/59  Pulse: 79 66 69 65  Resp: '20 18 15 20  '$ Temp:      TempSrc:      SpO2: 93% 96% 95% 95%  Weight:      Height:       General: Elderly patient was awake and alert and oriented x3. Not in any acute distress.  HEENT: NCAT.   PERRLA. EOMI. Sclerae anicteric.  Moist mucosal membranes. Neck: Neck supple without lymphadenopathy. No carotid bruits. No masses palpated.  Cardiovascular: Regular rate with normal S1-S2 sounds. No murmurs, rubs or gallops auscultated. No JVD.  Respiratory: Clear breath sounds.  No accessory muscle use. Abdomen: Soft, nontender, nondistended. Active bowel sounds. No masses or hepatosplenomegaly  Skin: No rashes, lesions, or ulcerations.  Dry, warm to touch. Musculoskeletal:  2+ dorsalis pedis and radial pulses. Good ROM.  No contractures  Psychiatric: Intact judgment and insight.  Mood appropriate to current condition. Neurologic: No focal neurological deficits. Strength is 5/5 x 4.  CN II - XII grossly intact.  Data Reviewed: EKG personally reviewed showed normal sinus rhythm at a rate of 80 bpm with nonspecific ST and T wave abnormality   Assessment and Plan: * Chest pain Continue telemetry  Troponins x2 - 21 > 26 EKG personally reviewed  showed normal sinus rhythm at a rate of 80 bpm with nonspecific ST and T wave abnormality Patient states that she was already scheduled to have a coronary CT scan Tuesday (3/14) Cardiology will be consulted to help decide if Stress test is needed in am Versus other  diagnostic modalities.    Continue aspirin, nitroglycerin prn        Elevated troponin Troponin x2 - 21 > 26 This may be due to type II demand ischemia Continue telemetry Continue to monitor troponin  Hyperlipidemia Patient takes Repatha every 2 weeks  Essential hypertension BP meds will be held at this time due to soft BP  Rheumatoid arthritis ([place]) Continue Norco per home regimen  ILD (interstitial lung disease) ([place]) Continue home breathing treatment  GERD (gastroesophageal reflux disease) Continue Protonix    Advance Care Planning:   Code Status: Full Code   Consults: Cardiology  Family Communication: None at bedside  Severity of Illness: The appropriate  patient status for this patient is INPATIENT. Inpatient status is judged to be reasonable and necessary in order to provide the required intensity of service to ensure the patient's safety. The patient's presenting symptoms, physical exam findings, and initial radiographic and laboratory data in the context of their chronic comorbidities is felt to place them at high risk for further clinical deterioration. Furthermore, it is not anticipated that the patient will be medically stable for discharge from the hospital within 2 midnights of admission.   * I certify that at the point of admission it is my clinical judgment that the patient will require inpatient hospital care spanning beyond 2 midnights from the point of admission due to high intensity of service, high risk for further deterioration and high frequency of surveillance required.*  Author: Bernadette Hoit, DO 04/02/2021 6:29 AM  For on call review [email or web address].

## 2021-04-01 NOTE — ED Provider Notes (Signed)
[facility] EMERGENCY DEPARTMENT Provider Note   CSN: [id number] Arrival date & time: 04/01/21  1706     History  Chief Complaint  Patient presents with   Chest Pain    Samantha Osborne is a 66 y.o. female.  Patient reports she has been having pain in the left side of her chest for the past month.  Patient reports she has been seen by Dr. Harl Bowie, cardiology and is scheduled to have a coronary CT on Tuesday.  Patient reports she took 4 nitroglycerin before coming in she did have relief with the nitroglycerin.  Patient has a past medical history of carotid stenosis, hyperlipidemia interstitial lung disease rheumatoid arthritis and coronary artery disease  The history is provided by the patient. No language interpreter was used.  Chest Pain Pain location:  L lateral chest Pain quality: aching   Pain radiates to:  L jaw Pain severity:  Moderate Onset quality:  Gradual Duration:  1 day Timing:  Constant Progression:  Worsening Chronicity:  New Context: not breathing   Relieved by:  Nothing Worsened by:  Nothing Ineffective treatments:  None tried Associated symptoms: no abdominal pain   Risk factors: coronary artery disease, high cholesterol and hypertension       Home Medications Prior to Admission medications   Medication Sig Start Date End Date Taking? Authorizing Provider  albuterol (VENTOLIN HFA) 108 (90 Base) MCG/ACT inhaler Inhale 2 puffs into the lungs daily as needed for shortness of breath. 01/06/21  Yes [provider]  amLODipine (NORVASC) 5 MG tablet Take 5 mg by mouth daily.   Yes [provider]  aspirin EC 81 MG tablet Take 81 mg by mouth daily.   Yes [provider]  buPROPion (WELLBUTRIN SR) 150 MG 12 hr tablet Take 1 tablet by mouth daily. 02/15/15  Yes [provider]  butalbital-acetaminophen-caffeine (FIORICET, ESGIC) 50-325-40 MG tablet Take 1 tablet by mouth every 4 (four) hours as needed. 03/22/15  Yes [provider]  celecoxib (CELEBREX) 100 MG capsule Take 100 mg by mouth 2 (two) times daily as needed. 06/05/19  Yes [provider]  Evolocumab with Infusor ([place]) 420 MG/3.5ML SOCT Inject 420 mg into the skin every 14 (fourteen) days.   Yes [provider]  fluticasone furoate-vilanterol (BREO ELLIPTA) 200-25 MCG/ACT AEPB Inhale 1 puff into the lungs daily. 03/15/21  Yes Mannam, Praveen, MD  HYDROcodone-acetaminophen (NORCO) 10-325 MG per tablet Take 1 tablet by mouth every 4 (four) hours as needed for moderate pain. Reported on 07/05/2015 09/07/13  Yes [provider]  levothyroxine (SYNTHROID) 150 MCG tablet Take 150 mcg by mouth daily. 03/06/21  Yes [provider]  methocarbamol (ROBAXIN) 500 MG tablet Take 1 tablet (500 mg total) by mouth 4 (four) times daily. 09/11/13  Yes [name], PA-C  metoprolol tartrate (LOPRESSOR) 25 MG tablet Take 25 mg by mouth 2 (two) times daily. 06/05/19  Yes [provider]  nitroGLYCERIN (NITROSTAT) 0.4 MG SL tablet Place 1 tablet (0.4 mg total) under the tongue every 5 (five) minutes as needed for chest pain. 03/23/21 06/21/21 Yes Branch, Alphonse Guild, MD  omeprazole (PRILOSEC) 40 MG capsule Take 1 capsule (40 mg total) by mouth daily. 03/15/21  Yes Mannam, Praveen, MD  oxyCODONE (OXY IR/ROXICODONE) 5 MG immediate release tablet Take 15 mg by mouth every 4 (four) hours as needed for severe pain. 06/15/15  Yes [provider]  valACYclovir (VALTREX) 1000 MG tablet Take 1,000 mg by mouth daily.  02/03/21  Yes [provider]  valsartan (DIOVAN) 160 MG tablet Take 160 mg by mouth daily. 02/05/20  Yes [provider]  metoprolol tartrate (LOPRESSOR) 100 MG tablet Take 1 tablet (100 mg total) by mouth once for 1 dose. Patient not taking: Reported on 04/01/2021 03/23/21 03/23/21  [name], MD      Allergies    Patient has no known allergies.    Review of Systems   Review of  Systems  Cardiovascular:  Positive for chest pain.  Gastrointestinal:  Negative for abdominal pain.  All other systems reviewed and are negative.  Physical Exam Updated Vital Signs BP 112/66    Pulse 70    Temp 98.4 F (36.9 C) (Oral)    Resp 18    Ht '5\' 3"'$  (1.6 m)    Wt 56.7 kg    SpO2 93%    BMI 22.14 kg/m  Physical Exam Vitals reviewed.  Constitutional:      Appearance: She is well-developed.  Eyes:     Extraocular Movements: Extraocular movements intact.     Pupils: Pupils are equal, round, and reactive to light.  Cardiovascular:     Rate and Rhythm: Normal rate and regular rhythm.     Heart sounds: Normal heart sounds.  Pulmonary:     Effort: Pulmonary effort is normal.     Breath sounds: Normal breath sounds.  Abdominal:     General: Bowel sounds are normal.     Palpations: Abdomen is soft.  Musculoskeletal:        General: Normal range of motion.     Cervical back: Normal range of motion.  Skin:    General: Skin is warm.  Neurological:     General: No focal deficit present.     Mental Status: She is alert.  Psychiatric:        Mood and Affect: Mood normal.    ED Results / Procedures / Treatments   Labs (all labs ordered are listed, but only abnormal results are displayed) Labs Reviewed  BASIC METABOLIC PANEL - Abnormal; Notable for the following components:      Result Value   Glucose, Bld 118 (*)    BUN 28 (*)    Creatinine, Ser 1.66 (*)    Calcium 8.5 (*)    GFR, Estimated 34 (*)    All other components within normal limits  CBC - Abnormal; Notable for the following components:   WBC 11.9 (*)    RBC 3.65 (*)    Hemoglobin 11.9 (*)    MCV 100.8 (*)    All other components within normal limits  TROPONIN I (HIGH SENSITIVITY) - Abnormal; Notable for the following components:   Troponin I (High Sensitivity) 21 (*)    All other components within normal limits  TROPONIN I (HIGH SENSITIVITY) - Abnormal; Notable for the following components:   Troponin I  (High Sensitivity) 26 (*)    All other components within normal limits    EKG EKG Interpretation  Date/Time:  Saturday April 01 2021 17:38:26 EST Ventricular Rate:  80 PR Interval:  146 QRS Duration: 82 QT Interval:  372 QTC Calculation: 429 R Axis:   11 Text Interpretation: Normal sinus rhythm Nonspecific ST and T wave abnormality Abnormal ECG When compared with ECG of 15-Mar-2015 21:36, T wave inversion now evident in AVL Confirmed by Dorie Rank [phone]) on 04/01/2021 5:41:33 PM  Radiology DG Chest 2 View  Result Date: 04/01/2021 CLINICAL DATA:  Acute chest pain for  1 month. EXAM: CHEST - 2 VIEW COMPARISON:  04/29/2020 chest CT, 03/15/2015 radiograph and prior studies FINDINGS: The cardiomediastinal silhouette is unremarkable. Chronic interstitial opacities are noted. There is no evidence of focal airspace disease, pulmonary edema, suspicious pulmonary nodule/mass, pleural effusion, or pneumothorax. No acute bony abnormalities are identified. IMPRESSION: 1. No evidence of acute cardiopulmonary disease. 2. Chronic interstitial opacities. Electronically Signed   By: Margarette Canada M.D.   On: 04/01/2021 17:57    Procedures Procedures    Medications Ordered in ED Medications - No data to display  ED Course/ Medical Decision Making/ A&P                           Medical Decision Making Pt complains of increased chest pain.   Amount and/or Complexity of Data Reviewed Independent Historian: spouse    Details: Pt's husband reports pt has pain with any exertion External Data Reviewed: notes.    Details: Cardiology notes reviewed.  Pt sees Dr. Harl Bowie Labs: ordered. Decision-making details documented in ED Course.    Details: Labs ordered, reviewed and interpreted. Pt has an elevation of troponin to 21 and 26. Radiology: ordered and independent interpretation performed. Decision-making details documented in ED Course.    Details: Chest xray ordered and reviewed.  Chest xray shows chronic  interstitial disease ECG/medicine tests: ordered and independent interpretation performed. Decision-making details documented in ED Course. Discussion of management or test interpretation with external provider(s): I spoke to Dr. Humphrey Rolls  Cardiology Fellow at [facility].  He advised hospitalist admission to [facility].  Cardiology will consult   Risk Prescription drug management. Decision regarding hospitalization. Risk Details: Pt to be admitted to [facility]            Final Clinical Impression(s) / ED Diagnoses Final diagnoses:  Exertional chest pain    Rx / DC Orders ED Discharge Orders     None         Sidney Ace 04/02/21 1257    Dorie Rank, MD 04/02/21 1505

## 2021-04-01 NOTE — ED Triage Notes (Signed)
Patient c/o midsternal chest pain that radiates into collarbone, neck and left arm. Per patient pain x1 month and is progressively getting worse. Patient took x4 Sl nitro today with no real relief. Patient has seen Dr Harl Bowie and Dr Gerarda Fraction. Patient has Cardiac appointment Tuesday. Denies any shortness of breath, dizziness, nausea, vomiting, or weakness. Per patient pain worse with slight activity.  ?

## 2021-04-02 DIAGNOSIS — I10 Essential (primary) hypertension: Secondary | ICD-10-CM | POA: Diagnosis not present

## 2021-04-02 DIAGNOSIS — G4730 Sleep apnea, unspecified: Secondary | ICD-10-CM

## 2021-04-02 DIAGNOSIS — R778 Other specified abnormalities of plasma proteins: Secondary | ICD-10-CM

## 2021-04-02 DIAGNOSIS — E785 Hyperlipidemia, unspecified: Secondary | ICD-10-CM

## 2021-04-02 DIAGNOSIS — D649 Anemia, unspecified: Secondary | ICD-10-CM

## 2021-04-02 DIAGNOSIS — K219 Gastro-esophageal reflux disease without esophagitis: Secondary | ICD-10-CM | POA: Diagnosis not present

## 2021-04-02 DIAGNOSIS — R7989 Other specified abnormal findings of blood chemistry: Secondary | ICD-10-CM

## 2021-04-02 DIAGNOSIS — N184 Chronic kidney disease, stage 4 (severe): Secondary | ICD-10-CM

## 2021-04-02 DIAGNOSIS — R079 Chest pain, unspecified: Secondary | ICD-10-CM | POA: Diagnosis not present

## 2021-04-02 LAB — RESP PANEL BY RT-PCR (FLU A&B, COVID) ARPGX2
Influenza A by PCR: NEGATIVE
Influenza B by PCR: NEGATIVE
SARS Coronavirus 2 by RT PCR: NEGATIVE

## 2021-04-02 LAB — HIV ANTIBODY (ROUTINE TESTING W REFLEX): HIV Screen 4th Generation wRfx: NONREACTIVE

## 2021-04-02 LAB — TROPONIN I (HIGH SENSITIVITY)
Troponin I (High Sensitivity): 43 ng/L — ABNORMAL HIGH (ref <18)
Troponin I (High Sensitivity): 51 ng/L — ABNORMAL HIGH (ref <18)

## 2021-04-02 LAB — MAGNESIUM: Magnesium: 1.9 mg/dL (ref 1.7–2.4)

## 2021-04-02 LAB — PHOSPHORUS: Phosphorus: 3.2 mg/dL (ref 2.5–4.6)

## 2021-04-02 MED ORDER — AMLODIPINE BESYLATE 2.5 MG PO TABS
2.5000 mg | ORAL_TABLET | Freq: Every day | ORAL | Status: DC
  Administered 2021-04-03 – 2021-04-04 (×2): 2.5 mg via ORAL
  Filled 2021-04-02 (×2): qty 1

## 2021-04-02 MED ORDER — TRAZODONE HCL 50 MG PO TABS
50.0000 mg | ORAL_TABLET | Freq: Every day | ORAL | Status: DC
  Administered 2021-04-02 – 2021-04-04 (×3): 50 mg via ORAL
  Filled 2021-04-02 (×3): qty 1

## 2021-04-02 MED ORDER — SODIUM CHLORIDE 0.9% FLUSH
3.0000 mL | Freq: Two times a day (BID) | INTRAVENOUS | Status: DC
Start: 2021-04-02 — End: 2021-04-05
  Administered 2021-04-03 (×2): 3 mL via INTRAVENOUS

## 2021-04-02 MED ORDER — BUPROPION HCL ER (SR) 150 MG PO TB12
150.0000 mg | ORAL_TABLET | Freq: Every day | ORAL | Status: DC
  Administered 2021-04-02 – 2021-04-04 (×3): 150 mg via ORAL
  Filled 2021-04-02 (×4): qty 1

## 2021-04-02 MED ORDER — SODIUM CHLORIDE 0.9 % IV SOLN: 250.0000 mL | INTRAVENOUS | Status: DC | PRN

## 2021-04-02 MED ORDER — ENOXAPARIN SODIUM 30 MG/0.3ML IJ SOSY
30.0000 mg | PREFILLED_SYRINGE | INTRAMUSCULAR | Status: DC
  Administered 2021-04-02: 30 mg via SUBCUTANEOUS
  Filled 2021-04-02: qty 0.3

## 2021-04-02 MED ORDER — ASPIRIN EC 81 MG PO TBEC
81.0000 mg | DELAYED_RELEASE_TABLET | Freq: Every day | ORAL | Status: DC
  Administered 2021-04-02 – 2021-04-04 (×2): 81 mg via ORAL
  Filled 2021-04-02 (×3): qty 1

## 2021-04-02 MED ORDER — HYDRALAZINE HCL 20 MG/ML IJ SOLN
10.0000 mg | Freq: Four times a day (QID) | INTRAMUSCULAR | Status: DC | PRN
Start: 2021-04-02 — End: 2021-04-05

## 2021-04-02 MED ORDER — LEVOTHYROXINE SODIUM 50 MCG PO TABS: 150.0000 ug | ORAL_TABLET | Freq: Every day | ORAL | Status: DC

## 2021-04-02 MED ORDER — ALBUTEROL SULFATE HFA 108 (90 BASE) MCG/ACT IN AERS: 2.0000 | INHALATION_SPRAY | Freq: Every day | RESPIRATORY_TRACT | Status: DC | PRN

## 2021-04-02 MED ORDER — SODIUM CHLORIDE 0.9 % WEIGHT BASED INFUSION: 1.0000 mL/kg/h | INTRAVENOUS | Status: DC

## 2021-04-02 MED ORDER — FLUTICASONE FUROATE-VILANTEROL 200-25 MCG/ACT IN AEPB
1.0000 | INHALATION_SPRAY | Freq: Every day | RESPIRATORY_TRACT | Status: DC
  Administered 2021-04-03 – 2021-04-04 (×2): 1 via RESPIRATORY_TRACT
  Filled 2021-04-02: qty 28

## 2021-04-02 MED ORDER — METOPROLOL TARTRATE 12.5 MG HALF TABLET
12.5000 mg | ORAL_TABLET | Freq: Two times a day (BID) | ORAL | Status: DC
  Administered 2021-04-02 – 2021-04-04 (×5): 12.5 mg via ORAL
  Filled 2021-04-02 (×5): qty 1

## 2021-04-02 MED ORDER — SODIUM CHLORIDE 0.9 % WEIGHT BASED INFUSION: 3.0000 mL/kg/h | INTRAVENOUS | Status: DC

## 2021-04-02 MED ORDER — ALBUTEROL SULFATE (2.5 MG/3ML) 0.083% IN NEBU: 2.5000 mg | INHALATION_SOLUTION | Freq: Four times a day (QID) | RESPIRATORY_TRACT | Status: DC | PRN

## 2021-04-02 MED ORDER — ASPIRIN 81 MG PO CHEW
81.0000 mg | CHEWABLE_TABLET | ORAL | Status: AC
  Administered 2021-04-03: 81 mg via ORAL
  Filled 2021-04-02: qty 1

## 2021-04-02 MED ORDER — SODIUM CHLORIDE 0.9% FLUSH: 3.0000 mL | INTRAVENOUS | Status: DC | PRN

## 2021-04-02 MED ORDER — VALACYCLOVIR HCL 500 MG PO TABS
1000.0000 mg | ORAL_TABLET | Freq: Every day | ORAL | Status: DC
  Administered 2021-04-02 – 2021-04-04 (×3): 1000 mg via ORAL
  Filled 2021-04-02 (×4): qty 2

## 2021-04-02 MED ORDER — LEVOTHYROXINE SODIUM 50 MCG PO TABS
150.0000 ug | ORAL_TABLET | Freq: Every day | ORAL | Status: DC
  Administered 2021-04-02: 150 ug via ORAL
  Filled 2021-04-02: qty 3

## 2021-04-02 MED ORDER — OXYCODONE HCL 5 MG PO TABS
15.0000 mg | ORAL_TABLET | Freq: Four times a day (QID) | ORAL | Status: DC | PRN
  Administered 2021-04-02 – 2021-04-04 (×4): 15 mg via ORAL
  Filled 2021-04-02 (×4): qty 3

## 2021-04-02 MED ORDER — AMLODIPINE BESYLATE 5 MG PO TABS
5.0000 mg | ORAL_TABLET | Freq: Every day | ORAL | Status: DC
  Administered 2021-04-02: 5 mg via ORAL
  Filled 2021-04-02: qty 1

## 2021-04-02 MED ORDER — LEVOTHYROXINE SODIUM 75 MCG PO TABS
150.0000 ug | ORAL_TABLET | Freq: Every day | ORAL | Status: DC
  Administered 2021-04-03 – 2021-04-05 (×3): 150 ug via ORAL
  Filled 2021-04-02 (×3): qty 2

## 2021-04-02 MED ORDER — SODIUM CHLORIDE 0.9 % IV SOLN
INTRAVENOUS | Status: DC
  Administered 2021-04-03: 50 mL via INTRAVENOUS

## 2021-04-02 MED ORDER — HYDROCODONE-ACETAMINOPHEN 10-325 MG PO TABS
1.0000 | ORAL_TABLET | ORAL | Status: DC | PRN
  Administered 2021-04-02 – 2021-04-04 (×4): 1 via ORAL
  Filled 2021-04-02 (×5): qty 1

## 2021-04-02 MED ORDER — PANTOPRAZOLE SODIUM 40 MG PO TBEC
40.0000 mg | DELAYED_RELEASE_TABLET | Freq: Every day | ORAL | Status: DC
  Administered 2021-04-02 – 2021-04-04 (×3): 40 mg via ORAL
  Filled 2021-04-02 (×3): qty 1

## 2021-04-02 MED ORDER — MUSCLE RUB 10-15 % EX CREA
TOPICAL_CREAM | CUTANEOUS | Status: DC | PRN
  Filled 2021-04-02: qty 85

## 2021-04-02 NOTE — Assessment & Plan Note (Signed)
Continue Norco per home regimen ?

## 2021-04-02 NOTE — ED Notes (Signed)
[facility] called to set up transportation at this time. 

## 2021-04-02 NOTE — Assessment & Plan Note (Signed)
BP meds will be held at this time due to soft BP ?

## 2021-04-02 NOTE — Progress Notes (Signed)
?PROGRESS NOTE ? ? ? ?Samantha Osborne  CZY:[id number] DOB: 1955-05-14 DOA: 04/01/2021 ?PCP: Redmond School, MD ? ? ?Brief Narrative:  ?For full history see HPI and progress note by satellite campus at [facility] with Dr. Josephine Cables and Dr. Denton Brick. ? 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  Will need close following postcontrast to ensure appropriate renal recovery. ? ? ?Assessment & Plan: ?  ?Principal Problem: ?  Chest pain ?Active Problems: ?  Elevated troponin ?  GERD (gastroesophageal reflux disease) ?  ILD (interstitial lung disease) ([place]) ?  Rheumatoid arthritis ([place]) ?  Essential hypertension ?  Hyperlipidemia ? ?Consultants:  ?Cardiology ? ?Procedures:  ?Heart cath 04/03/2021 ? ?Antimicrobials:  ?None ? ?Objective: ?Vitals:  ? 04/02/21 0400 04/02/21 0718 04/02/21 0837 04/02/21 1043  ?BP: (!) 96/50 135/70 127/72 118/72  ?Pulse: 65 75 84 75  ?Resp: '15 19 20 18  '$ ?Temp:    98.5 ?F (36.9 ?C)  ?TempSrc:      ?SpO2: 97% 98% 100% 97%  ?Weight:    55.6 kg  ?Height:    '5\' 3"'$  (1.6 m)  ? ?No intake or output data in the 24 hours ending 04/02/21 1328 ?Filed Weights  ? 04/01/21 1730 04/02/21 1043  ?Weight: 56.7 kg 55.6 kg  ? ? ?Data Reviewed: I have personally reviewed following labs and imaging studies ? ?CBC: ?Recent Labs  ?Lab 04/01/21 ?1826  ?WBC 11.9*  ?HGB 11.9*  ?HCT 36.8  ?MCV 100.8*  ?PLT 236  ? ?Basic Metabolic Panel: ?Recent Labs  ?Lab 03/30/21 ?0813 04/01/21 ?1826 04/02/21 ?0932  ?NA 137 136  --   ?K 4.8 4.7  --   ?CL 102 105  --   ?CO2 22 24  --   ?GLUCOSE 114* 118*  --   ?BUN 24 28*  --   ?CREATININE 1.57* 1.66*  --   ?CALCIUM 9.5 8.5*  --   ?MG  --   --  1.9  ?PHOS  --   --  3.2  ? ?GFR: ?Estimated Creatinine Clearance: 27.2 mL/min (A) (by C-G formula based on SCr of 1.66 mg/dL (H)). ?Liver  Function Tests: ?No results for input(s): AST, ALT, ALKPHOS, BILITOT, PROT, ALBUMIN in the last 168 hours. ?No results for input(s): LIPASE, AMYLASE in the last 168 hours. ?No results for input(s): AMMONIA in the last 168 hours. ?Coagulation Profile: ?No results for input(s): INR, PROTIME in the last 168 hours. ?Cardiac Enzymes: ?No results for input(s): CKTOTAL, CKMB, CKMBINDEX, TROPONINI in the last 168 hours. ?BNP (last 3 results) ?No results for input(s): PROBNP in the last 8760 hours. ?HbA1C: ?No results for input(s): HGBA1C in the last 72 hours. ?CBG: ?No results for input(s): GLUCAP in the last 168 hours. ?Lipid Profile: ?No results for input(s): CHOL, HDL, LDLCALC, TRIG, CHOLHDL, LDLDIRECT in the last 72 hours. ?Thyroid Function Tests: ?No results for input(s): TSH, T4TOTAL, FREET4, T3FREE, THYROIDAB in the last 72 hours. ?Anemia Panel: ?No results for input(s): VITAMINB12, FOLATE, FERRITIN, TIBC, IRON, RETICCTPCT in the last 72 hours. ?Sepsis Labs: ?No results for input(s): PROCALCITON, LATICACIDVEN in the last 168 hours. ? ?Recent Results (from the past 240 hour(s))  ?Resp Panel by RT-PCR (Flu A&B, Covid) Nasopharyngeal Swab     Status: None  ? Collection Time: 04/01/21 11:32 PM  ? Specimen: Nasopharyngeal Swab; Nasopharyngeal(NP) swabs in vial transport medium  ?  Result Value Ref Range Status  ? SARS Coronavirus 2 by RT PCR NEGATIVE NEGATIVE Final  ?  Comment: (NOTE) ?SARS-CoV-2 target nucleic acids are NOT DETECTED. ? ?The SARS-CoV-2 RNA is generally detectable in upper respiratory ?specimens during the acute phase of infection. The lowest ?concentration of SARS-CoV-2 viral copies this assay can detect is ?138 copies/mL. A negative result does not preclude SARS-Cov-2 ?infection and should not be used as the sole basis for treatment or ?other patient management decisions. A negative result may occur with  ?improper specimen collection/handling, submission of specimen other ?than nasopharyngeal swab,  presence of viral mutation(s) within the ?areas targeted by this assay, and inadequate number of viral ?copies(<138 copies/mL). A negative result must be combined with ?clinical observations, patient history, and epidemiological ?information. The expected result is Negative. ? ?Fact Sheet for Patients:  ?EntrepreneurPulse.com.au ? ?Fact Sheet for Healthcare Providers:  ?IncredibleEmployment.be ? ?This test is no t yet approved or cleared by the [place] FDA and  ?has been authorized for detection and/or diagnosis of SARS-CoV-2 by ?FDA under an Emergency Use Authorization (EUA). This EUA will remain  ?in effect (meaning this test can be used) for the duration of the ?COVID-19 declaration under Section 564(b)(1) of the Act, 21 ?U.S.C.section 360bbb-3(b)(1), unless the authorization is terminated  ?or revoked sooner.  ? ? ?  ? Influenza A by PCR NEGATIVE NEGATIVE Final  ? Influenza B by PCR NEGATIVE NEGATIVE Final  ?  Comment: (NOTE) ?The Xpert Xpress SARS-CoV-2/FLU/RSV plus assay is intended as an aid ?in the diagnosis of influenza from Nasopharyngeal swab specimens and ?should not be used as a sole basis for treatment. Nasal washings and ?aspirates are unacceptable for Xpert Xpress SARS-CoV-2/FLU/RSV ?testing. ? ?Fact Sheet for Patients: ?EntrepreneurPulse.com.au ? ?Fact Sheet for Healthcare Providers: ?IncredibleEmployment.be ? ?This test is not yet approved or cleared by the [place] FDA and ?has been authorized for detection and/or diagnosis of SARS-CoV-2 by ?FDA under an Emergency Use Authorization (EUA). This EUA will remain ?in effect (meaning this test can be used) for the duration of the ?COVID-19 declaration under Section 564(b)(1) of the Act, 21 U.S.C. ?section 360bbb-3(b)(1), unless the authorization is terminated or ?revoked. ? ?Performed at [facility], [address] [place], [place] [address] ?  ?  ? ? ? ? ? ?Radiology  Studies: ?DG Chest 2 View ? ?Result Date: 04/01/2021 ?CLINICAL DATA:  Acute chest pain for 1 month. EXAM: CHEST - 2 VIEW COMPARISON:  04/29/2020 chest CT, 03/15/2015 radiograph and prior studies FINDINGS: The cardiomediastinal silhouette is unremarkable. Chronic interstitial opacities are noted. There is no evidence of focal airspace disease, pulmonary edema, suspicious pulmonary nodule/mass, pleural effusion, or pneumothorax. No acute bony abnormalities are identified. IMPRESSION: 1. No evidence of acute cardiopulmonary disease. 2. Chronic interstitial opacities. Electronically Signed   By: Margarette Canada M.D.   On: 04/01/2021 17:57   ? ? ? ? ? ?Scheduled Meds: ? amLODipine  5 mg Oral Daily  ? aspirin EC  81 mg Oral Daily  ? buPROPion  150 mg Oral Daily  ? enoxaparin (LOVENOX) injection  30 mg Subcutaneous Q24H  ? fluticasone furoate-vilanterol  1 puff Inhalation Daily  ? [START ON 04/03/2021] levothyroxine  150 mcg Oral Q0600  ? pantoprazole  40 mg Oral Daily  ? valACYclovir  1,000 mg Oral Daily  ? ?Continuous Infusions: ? sodium chloride 50 mL/hr at 04/02/21 1311  ? ? ? LOS: 1 day  ? ?Time spent: 48mn ? ?WLittle Ishikawa DO ?[facility] ? ?If 7PM-7AM, please contact night-coverage ?[email or web address] ? ?04/02/2021, 1:28 PM   ? ? ? ?

## 2021-04-02 NOTE — Assessment & Plan Note (Deleted)
Continue Synthroid °

## 2021-04-02 NOTE — Consult Note (Addendum)
Cardiology Consultation:   Patient ID: Samantha Osborne MRN: [id number]; DOB: Dec 09, 1955  Admit date: 04/01/2021 Date of Consult: 04/02/2021  PCP:  Redmond School, MD   [place] Providers Cardiologist:  Carlyle Dolly, MD        Patient Profile:   Samantha Osborne is a 66 y.o. female with a hx of chest pain (coronary calcification by prior CT, no ischemia by NST in 03/2015), palpitations (PAC's and brief NSVT by prior monitor), bicuspid aortic valve (mild stenosis by echo in 02/2018), carotid artery stenosis, HLD (intolerant to statins and on Repatha), Stage 3 CKD, history of DVT, tobacco use and family history of CAD (patient's mom had CABG in her 5's) who is being seen 04/02/2021 for the evaluation of chest pain at the request of Dr. Avon Gully.  History of Present Illness:   Samantha Osborne was recently examined by Dr. Harl Bowie on 03/23/2021 and reported intermittent chest pain for the past 4 weeks which would typically come on with activity and radiate into her neck and left arm. Had recently been started on Prilosec with no improvement in her symptoms. A Coronary CT was recommended for further evaluation and is tentatively scheduled for 04/04/2021.  She presented to [facility] ED on 04/01/2021 for worsening chest pain that day which persisted despite SL NTG x4. Initial labs showed WBC 11.9, Hgb 11.9, platelets 236, Na+ 136, K+ 4.7 and creatinine 1.66 (at 1.5 earlier this month). Negative for COVID and Influenza. Initial Hs Troponin 21 with values slowly trending up to 26, 43 and 51. CXR showing chronic interstitial opacities with no acute findings. EKG shows NSR, HR 80 with slight ST depression along Leads I and AVL. Given no Cardiology coverage at [facility] over the weekend, she was transferred to [facility] for cardiac evaluation.   In talking with the patient today, she reports having chest pain intermittently for the past month but her symptoms are occurring more frequently. She reports  developing a discomfort/ache along her sternal region with activity and this radiates into her neck and down her left arm. Episodes on the day of admission were occurring with minimal activity such as walking to her kitchen and would improve with SL NTG but she kept having to use NTG throughout the day. Her pain has not been associated with positional changes or food consumption. No recent orthopnea, PND or pitting edema. She does continue to smoke 0.5 ppd and has been doing so since age 88.  She is resting in bed at this time but is pain-free currently.   Past Medical History:  Diagnosis Date   Carotid artery occlusion    DDD (degenerative disc disease)    Depression    DVT (deep venous thrombosis) ([place]) 2008   right leg   Hypertension    Thyroid disease     Past Surgical History:  Procedure Laterality Date   CESAREAN SECTION     DILATION AND CURETTAGE OF UTERUS     SHOULDER ARTHROSCOPY Left 2005     Home Medications:  Prior to Admission medications   Medication Sig Start Date End Date Taking? Authorizing Provider  albuterol (VENTOLIN HFA) 108 (90 Base) MCG/ACT inhaler Inhale 2 puffs into the lungs daily as needed for shortness of breath. 01/06/21  Yes [provider]  amLODipine (NORVASC) 5 MG tablet Take 5 mg by mouth daily.   Yes [provider]  aspirin EC 81 MG tablet Take 81 mg by mouth daily.   Yes [provider]  buPROPion (WELLBUTRIN SR) 150 MG 12 hr tablet Take 1 tablet by mouth daily. 02/15/15  Yes [provider]  butalbital-acetaminophen-caffeine (FIORICET, ESGIC) 50-325-40 MG tablet Take 1 tablet by mouth every 4 (four) hours as needed. 03/22/15  Yes [provider]  celecoxib (CELEBREX) 100 MG capsule Take 100 mg by mouth 2 (two) times daily as needed. 06/05/19  Yes [provider]  Evolocumab with Infusor ([place]) 420 MG/3.5ML SOCT Inject 420 mg into the skin every 14 (fourteen) days.   Yes  [provider]  fluticasone furoate-vilanterol (BREO ELLIPTA) 200-25 MCG/ACT AEPB Inhale 1 puff into the lungs daily. 03/15/21  Yes Mannam, Praveen, MD  HYDROcodone-acetaminophen (NORCO) 10-325 MG per tablet Take 1 tablet by mouth every 4 (four) hours as needed for moderate pain. Reported on 07/05/2015 09/07/13  Yes [provider]  levothyroxine (SYNTHROID) 150 MCG tablet Take 150 mcg by mouth daily. 03/06/21  Yes [provider]  methocarbamol (ROBAXIN) 500 MG tablet Take 1 tablet (500 mg total) by mouth 4 (four) times daily. 09/11/13  Yes [name], PA-C  metoprolol tartrate (LOPRESSOR) 25 MG tablet Take 25 mg by mouth 2 (two) times daily. 06/05/19  Yes [provider]  nitroGLYCERIN (NITROSTAT) 0.4 MG SL tablet Place 1 tablet (0.4 mg total) under the tongue every 5 (five) minutes as needed for chest pain. 03/23/21 06/21/21 Yes Branch, Alphonse Guild, MD  omeprazole (PRILOSEC) 40 MG capsule Take 1 capsule (40 mg total) by mouth daily. 03/15/21  Yes Mannam, Praveen, MD  oxyCODONE (OXY IR/ROXICODONE) 5 MG immediate release tablet Take 15 mg by mouth every 4 (four) hours as needed for severe pain. 06/15/15  Yes [provider]  valACYclovir (VALTREX) 1000 MG tablet Take 1,000 mg by mouth daily. 02/03/21  Yes [provider]  valsartan (DIOVAN) 160 MG tablet Take 160 mg by mouth daily. 02/05/20  Yes [provider]  metoprolol tartrate (LOPRESSOR) 100 MG tablet Take 1 tablet (100 mg total) by mouth once for 1 dose. Patient not taking: Reported on 04/01/2021 03/23/21 03/23/21  [name], MD    Inpatient Medications: Scheduled Meds:  amLODipine  5 mg Oral Daily   aspirin EC  81 mg Oral Daily   buPROPion  150 mg Oral Daily   enoxaparin (LOVENOX) injection  30 mg Subcutaneous Q24H   fluticasone furoate-vilanterol  1 puff Inhalation Daily   [START ON 04/03/2021] levothyroxine  150 mcg Oral Q0600   pantoprazole  40 mg Oral Daily   valACYclovir   1,000 mg Oral Daily   Continuous Infusions:  sodium chloride     PRN Meds: albuterol, hydrALAZINE, HYDROcodone-acetaminophen, nitroGLYCERIN, oxyCODONE  Allergies:   No Known Allergies  Social History:   Social History   Socioeconomic History   Marital status: Married    Spouse name: Not on file   Number of children: Not on file   Years of education: Not on file   Highest education level: Not on file  Occupational History   Not on file  Tobacco Use   Smoking status: Every Day    Packs/day: 0.75    Types: Cigarettes    Start date: 03/29/1970    Passive exposure: Current   Smokeless tobacco: Never   Tobacco comments:    Smokes 7 cigs per day 03/15/21-lt  Vaping Use   Vaping Use: Some days   Substances: Nicotine  Substance and Sexual Activity   Alcohol use: No    Alcohol/week: 0.0 standard drinks   Drug  use: No   Sexual activity: Yes  Other Topics Concern   Not on file  Social History Narrative   Not on file   Social Determinants of Health   Financial Resource Strain: Not on file  Food Insecurity: Not on file  Transportation Needs: Not on file  Physical Activity: Not on file  Stress: Not on file  Social Connections: Not on file  Intimate Partner Violence: Not on file    Family History:    Family History  Problem Relation Age of Onset   Heart disease Mother    Hyperlipidemia Mother    Heart attack Mother    AAA (abdominal aortic aneurysm) Mother    Cancer Father    Heart disease Brother    Hypertension Brother    Hypertension Son      ROS:  Please see the history of present illness.   All other ROS reviewed and negative.     Physical Exam/Data:   Vitals:   04/02/21 0400 04/02/21 0718 04/02/21 0837 04/02/21 1043  BP: (!) 96/50 135/70 127/72 118/72  Pulse: 65 75 84 75  Resp: '15 19 20 18  '$ Temp:    98.5 F (36.9 C)  TempSrc:      SpO2: 97% 98% 100% 97%  Weight:    55.6 kg  Height:    '5\' 3"'$  (1.6 m)   No intake or output data in the 24 hours  ending 04/02/21 1231 Last 3 Weights 04/02/2021 04/01/2021 03/23/2021  Weight (lbs) 122 lb 8 oz 125 lb 128 lb  Weight (kg) 55.566 kg 56.7 kg 58.06 kg     Body mass index is 21.7 kg/m.  General:  Pleasant female appearing in no acute distress. HEENT: normal Neck: no JVD Vascular: No carotid bruits; Distal pulses 2+ bilaterally Cardiac:  normal S1, S2; RRR; 2/6 SEM along RUSB.  Lungs:  clear to auscultation bilaterally, no wheezing, rhonchi or rales  Abd: soft, nontender, no hepatomegaly  Ext: no pitting edema Musculoskeletal:  No deformities, BUE and BLE strength normal and equal Skin: warm and dry  Neuro:  CNs 2-12 intact, no focal abnormalities noted Psych:  Normal affect   EKG:  The EKG was personally reviewed and demonstrates: NSR, HR 80 with slight ST depression along Leads I and AVL.  Telemetry:  Telemetry was personally reviewed and demonstrates:  NSR, HR in 70's to 80's.   Relevant CV Studies:  NST: 03/2015 There was no ST segment deviation noted during stress. The study is normal. There are no perfusion defects consistent with prior infarct or current ischemia. This is an intermediate risk study. Intermediate risk based on low calculated LVEF. Visually the LVEF looks normal, consider correlation with echo. There is no myocardium at jeopardy. The left ventricular ejection fraction is moderately decreased (30-44%).  Echocardiogram: 02/2018 IMPRESSIONS     1. The left ventricle has normal systolic function with an ejection  fraction of 60-65%. The cavity size was normal. Left ventricular diastolic  parameters were normal. No evidence of left ventricular regional wall  motion abnormalities.   2. The right ventricle has normal systolic function. The cavity was  normal. There is no increase in right ventricular wall thickness.   3. The mitral valve is degenerative. Mild calcification of the mitral  valve leaflet. There is mild mitral annular calcification present.   4. The  tricuspid valve is normal in structure.   5. The aortic valve is bicuspid. Mild stenosis of the aortic valve. Mean  gradient only 6  mmHg, valve area by planimetry of 1.4-1.5 cm2, LVOT/AV VTI  0.53.   6. The aortic root is normal in size and structure.   Carotid Dopplers: 10/2020 Summary:  Right Carotid: Velocities in the right ICA are consistent with a 40-59%                 stenosis.   Left Carotid: Velocities in the left ICA are consistent with a 60-79%  stenosis.                The ECA appears >50% stenosed.   Laboratory Data:  High Sensitivity Troponin:   Recent Labs  Lab 04/01/21 1826 04/01/21 2032 04/02/21 0347 04/02/21 0532  TROPONINIHS 21* 26* 43* 51*     Chemistry Recent Labs  Lab 03/30/21 0813 04/01/21 1826 04/02/21 0347  NA 137 136  --   K 4.8 4.7  --   CL 102 105  --   CO2 22 24  --   GLUCOSE 114* 118*  --   BUN 24 28*  --   CREATININE 1.57* 1.66*  --   CALCIUM 9.5 8.5*  --   MG  --   --  1.9  GFRNONAA  --  34*  --   ANIONGAP  --  7  --     No results for input(s): PROT, ALBUMIN, AST, ALT, ALKPHOS, BILITOT in the last 168 hours. Lipids No results for input(s): CHOL, TRIG, HDL, LABVLDL, LDLCALC, CHOLHDL in the last 168 hours.  Hematology Recent Labs  Lab 04/01/21 1826  WBC 11.9*  RBC 3.65*  HGB 11.9*  HCT 36.8  MCV 100.8*  MCH 32.6  MCHC 32.3  RDW 13.2  PLT 236   Thyroid No results for input(s): TSH, FREET4 in the last 168 hours.  BNPNo results for input(s): BNP, PROBNP in the last 168 hours.  DDimer No results for input(s): DDIMER in the last 168 hours.   Radiology/Studies:  DG Chest 2 View  Result Date: 04/01/2021 CLINICAL DATA:  Acute chest pain for 1 month. EXAM: CHEST - 2 VIEW COMPARISON:  04/29/2020 chest CT, 03/15/2015 radiograph and prior studies FINDINGS: The cardiomediastinal silhouette is unremarkable. Chronic interstitial opacities are noted. There is no evidence of focal airspace disease, pulmonary edema, suspicious pulmonary  nodule/mass, pleural effusion, or pneumothorax. No acute bony abnormalities are identified. IMPRESSION: 1. No evidence of acute cardiopulmonary disease. 2. Chronic interstitial opacities. Electronically Signed   By: Margarette Canada M.D.   On: 04/01/2021 17:57     Assessment and Plan:   1. Chest Pain concerning for Accelerating Angina - Presents with chest pain occurring with activity and radiating into her jaw and left arm which occurs with activity and improves with rest or SL NTG and episodes are increasing in frequency and severity. Hs Troponin values have been mildly elevated at 21, 26, 43 and 51 but are gradually trending up. Will recheck in AM. If she has recurrent chest pain, would start Heparin.  - Reviewed with Dr. Caryl Comes and given her concerning symptoms along with multiple cardiac risk factors (coronary calcifications, family history of her mother having CABG, tobacco use and HLD), would plan for a cardiac catheterization for definitive evaluation. The patient understands that risks include but are not limited to stroke (1 in 1000), death (1 in 7), kidney failure [usually temporary] (1 in 500), bleeding (1 in 200), allergic reaction [possibly serious] (1 in 200).   - Continue ASA '81mg'$  daily. Intolerant to statins and on Repatha as an outpatient.  May require additional medication changes pending echo and cath results.   2. Bicuspid Aortic Valve - Noted on echo in 02/2018 with only mild AS present. Will obtain an updated echocardiogram for further assessment.   3. Palpitations - She did have PAC's and brief NSVT by prior monitor but no recent palpitations and she has been in NSR this admission. Continue to follow on telemetry. Not currently on AV nodal blocking agents.   4. Carotid Artery Stenosis - Followed by Vascular as an outpatient with dopplers in 10/2020 showing 40-59% RICA stenosis and 93-81% LICA stenosis. Remains on ASA and Repatha. Intolerant to statins.   5. HLD - She has been  intolerant to statins and is on Repatha as an outpatient. Repeat FLP pending.   6. Stage 3 CKD - Baseline creatinine ~ 1.5 by recent labs. At 1.66 today. Will hydrate for catheterization.   7. Anemia - Hgb at 11.9 on admission and no recent values for comparison. MCV elevated at 100.8. Will check B12 and Folate along with Ferritin.    Risk Assessment/Risk Scores:     TIMI Risk Score for Unstable Angina or Non-ST Elevation MI:   The patient's TIMI risk score is 6, which indicates a 41% risk of all cause mortality, new or recurrent myocardial infarction or need for urgent revascularization in the next 14 days.  For questions or updates, please contact [place] Please consult [email or web address] for contact info under    Signed, Erma Heritage, PA-C  04/02/2021 12:31 PM \\ Chest pain-typical with crescendo symptoms  Vascular disease with known nonobstructive carotid stenosis  Hyperlipidemia on Repatha  CKD Estimated Creatinine Clearance: 27.2 mL/min (A) (by C-G formula based on SCr of 1.66 mg/dL (H)).  Stage IV acute/chronic  Anemia  Sleep disordered breathing with a remotely positive stress test  Depression  Patient has a concerning history with crescendo exertional chest discomfort relieved by rest and nitroglycerin.  Threshold for discomfort has become increasingly low i.e. making a bed.  With her high pretest probability I do not think that noninvasive testing is sufficiently sensitive.  Moreover, is not insignificant epicardial disease is identified, perhaps microvascular disease should be considered at that same time.  She has renal insufficiency.  We do not know what her baseline creatinine is.  We will hydrate her in anticipation for tomorrow.  Given her anemia and her renal insufficiency systemic diseases may also be considered   Needs outpatient sleep study and have encouraged her to follow-up with her PCP regarding treatment of her ongoing depression following  the death of her father.

## 2021-04-02 NOTE — Plan of Care (Signed)
?  Problem: Education: Goal: Knowledge of General Education information will improve Description: Including pain rating scale, medication(s)/side effects and non-pharmacologic comfort measures Outcome: Progressing   Problem: Clinical Measurements: Goal: Respiratory complications will improve Outcome: Progressing Goal: Cardiovascular complication will be avoided Outcome: Progressing   Problem: Activity: Goal: Risk for activity intolerance will decrease Outcome: Progressing   Problem: Coping: Goal: Level of anxiety will decrease Outcome: Progressing   

## 2021-04-02 NOTE — Assessment & Plan Note (Signed)
Patient takes Repatha every 2 weeks ?

## 2021-04-02 NOTE — Assessment & Plan Note (Signed)
Continue home breathing treatment ?

## 2021-04-02 NOTE — Assessment & Plan Note (Signed)
Continue telemetry  ?Troponins x2 - 21 > 26 ?EKG personally reviewed showed normal sinus rhythm at a rate of 80 bpm with nonspecific ST and T wave abnormality ?Patient states that she was already scheduled to have a coronary CT scan Tuesday (3/14) ?Cardiology will be consulted to help decide if Stress test is needed in am Versus other  diagnostic modalities.    ?Continue aspirin, nitroglycerin prn ? ? ? ? ? ? ?

## 2021-04-02 NOTE — Progress Notes (Signed)
PROGRESS NOTE     Samantha Osborne, is a 66 y.o. female, DOB - [birth year], QIO:[id number]  Admit date - 04/01/2021   Admitting Physician Bernadette Hoit, DO  Outpatient Primary MD for the patient is Redmond School, MD  LOS - 1  Chief Complaint  Patient presents with   Chest Pain        Brief Narrative:  66 year old smoker with history of HTN, depression, HLD, CKD 4, history of prior DVT , rheumatoid arthritis and COPD/ILD admitted on 04/01/2021 with concerns about recurrent chest pains with some typical features in a patient with cardiovascular risk factors requiring possible LHC on 04/03/21 -CKD requiring IV fluids prior to [facility]    -Assessment and Plan: 1) Chest pain -Chest pain on exertion with some typical features -EKG without acute ST changes, troponins are flat -Prior cardiovascular work-up revealed coronary calcification by prior CT, no ischemia by NST in 03/2015 - continue telemetry  -Continue aspirin and metoprolol -Cardiology consult appreciated plans for [facility] on 04/03/2021 - patient is intolerant to statins -She gets Repatha every 2 weeks  2)NSVT/bicuspid aortic valve--repeat echo pending  3) CKD stage - 3B -Since LHC is being considered to benefit from hydration prior to catheterization--- normal saline as ordered --Hold valsartan -renally adjust medications, avoid nephrotoxic agents / dehydration  / hypotension  4)HTN--avoid hypotension, hold valsartan as above #1 -Okay to restart amlodipine and metoprolol  5) tobacco abuse/COPD/ILD--- no acute exacerbation, continue bronchodilators -Patient is not ready to quit smoking  6) chronic arthralgia/chronic pain syndrome/rheumatoid arthritis----continue PTA opiates  7) depression/anxiety--- continue Wellbutrin , give trazodone nightly  8) anemia--- May be related to underlying CKD and rheumatoid arthritis - MCV elevated -No bleeding concerns -Patient underlying CKD, may benefit from ESA agent if anemia  worsens -B12, folate, serum iron and TIBC and ferritin pending,   9)GERD--continue PPI  Disposition/Need for in-Hospital Stay- patient unable to be discharged at this time due to --recurrent chest pains with some typical features in a patient with cardiovascular risk factors requiring possible LHC in the a.m. -CKD requiring IV fluids prior to LHC  Status is: Inpatient   Disposition: The patient is from: Home              Anticipated d/c is to: Home              Anticipated d/c date is: 2 days              Patient currently is not medically stable to d/c. Barriers: Not Clinically Stable-   Code Status :  -  Code Status: Full Code   Family Communication:   (patient is alert, awake and coherent) Discussed with patient's husband at bedside  DVT Prophylaxis  :   - SCDs   enoxaparin (LOVENOX) injection 30 mg Start: 04/02/21 0600 SCDs Start: 04/02/21 0441   Lab Results  Component Value Date   PLT 236 04/01/2021    Inpatient Medications  Scheduled Meds:  amLODipine  5 mg Oral Daily   aspirin EC  81 mg Oral Daily   buPROPion  150 mg Oral Daily   enoxaparin (LOVENOX) injection  30 mg Subcutaneous Q24H   fluticasone furoate-vilanterol  1 puff Inhalation Daily   [START ON 04/03/2021] levothyroxine  150 mcg Oral Q0600   pantoprazole  40 mg Oral Daily   valACYclovir  1,000 mg Oral Daily   Continuous Infusions: PRN Meds:.albuterol, hydrALAZINE, HYDROcodone-acetaminophen, nitroGLYCERIN, oxyCODONE   Anti-infectives (From admission, onward)    Start  Dose/Rate Route Frequency Ordered Stop   04/02/21 1000  valACYclovir (VALTREX) tablet 1,000 mg        1,000 mg Oral Daily 04/02/21 1610          Subjective: Samantha Osborne today has no fevers, no emesis,     -Reports intermittent chest pain with exertion -Some dyspnea on exertion as well -Palpitations as well -No significant dizziness -No leg pains or pleuritic symptoms -Additional history obtained from patient's husband at  bedside  Objective: Vitals:   04/02/21 0330 04/02/21 0400 04/02/21 0718 04/02/21 0837  BP: (!) 95/59 (!) 96/50 135/70 127/72  Pulse: 65 65 75 84  Resp: '20 15 19 20  '$ Temp:      TempSrc:      SpO2: 95% 97% 98% 100%  Weight:      Height:       No intake or output data in the 24 hours ending 04/02/21 1034 Filed Weights   04/01/21 1730  Weight: 56.7 kg    Physical Exam  Gen:- Awake Alert,  in no apparent distress  HEENT:- [place].AT, No sclera icterus Neck-Supple Neck,No JVD,.  Lungs-  CTAB , fair symmetrical air movement CV- S1, S2 normal, regular  Abd-  +ve B.Sounds, Abd Soft, No tenderness,    Extremity/Skin:- No  edema, pedal pulses present  Psych-affect is appropriate, oriented x3 Neuro-no new focal deficits, no tremors MSK-arthritic changes of both hands  Data Reviewed: I have personally reviewed following labs and imaging studies  CBC: Recent Labs  Lab 04/01/21 1826  WBC 11.9*  HGB 11.9*  HCT 36.8  MCV 100.8*  PLT 960   Basic Metabolic Panel: Recent Labs  Lab 03/30/21 0813 04/01/21 1826 04/02/21 0347  NA 137 136  --   K 4.8 4.7  --   CL 102 105  --   CO2 22 24  --   GLUCOSE 114* 118*  --   BUN 24 28*  --   CREATININE 1.57* 1.66*  --   CALCIUM 9.5 8.5*  --   MG  --   --  1.9  PHOS  --   --  3.2   GFR: Estimated Creatinine Clearance: 27.2 mL/min (A) (by C-G formula based on SCr of 1.66 mg/dL (H)). Liver Function Tests: No results for input(s): AST, ALT, ALKPHOS, BILITOT, PROT, ALBUMIN in the last 168 hours. Cardiac Enzymes: No results for input(s): CKTOTAL, CKMB, CKMBINDEX, TROPONINI in the last 168 hours. BNP (last 3 results) No results for input(s): PROBNP in the last 8760 hours. HbA1C: No results for input(s): HGBA1C in the last 72 hours. Sepsis Labs: '@LABRCNTIP'$ (procalcitonin:4,lacticidven:4) ) Recent Results (from the past 240 hour(s))  Resp Panel by RT-PCR (Flu A&B, Covid) Nasopharyngeal Swab     Status: None   Collection Time: 04/01/21  11:32 PM   Specimen: Nasopharyngeal Swab; Nasopharyngeal(NP) swabs in vial transport medium  Result Value Ref Range Status   SARS Coronavirus 2 by RT PCR NEGATIVE NEGATIVE Final    Comment: (NOTE) SARS-CoV-2 target nucleic acids are NOT DETECTED.  The SARS-CoV-2 RNA is generally detectable in upper respiratory specimens during the acute phase of infection. The lowest concentration of SARS-CoV-2 viral copies this assay can detect is 138 copies/mL. A negative result does not preclude SARS-Cov-2 infection and should not be used as the sole basis for treatment or other patient management decisions. A negative result may occur with  improper specimen collection/handling, submission of specimen other than nasopharyngeal swab, presence of viral mutation(s) within the areas targeted by  this assay, and inadequate number of viral copies(<138 copies/mL). A negative result must be combined with clinical observations, patient history, and epidemiological information. The expected result is Negative.  Fact Sheet for Patients:  EntrepreneurPulse.com.au  Fact Sheet for Healthcare Providers:  IncredibleEmployment.be  This test is no t yet approved or cleared by the [place] FDA and  has been authorized for detection and/or diagnosis of SARS-CoV-2 by FDA under an Emergency Use Authorization (EUA). This EUA will remain  in effect (meaning this test can be used) for the duration of the COVID-19 declaration under Section 564(b)(1) of the Act, 21 U.S.C.section 360bbb-3(b)(1), unless the authorization is terminated  or revoked sooner.       Influenza A by PCR NEGATIVE NEGATIVE Final   Influenza B by PCR NEGATIVE NEGATIVE Final    Comment: (NOTE) The Xpert Xpress SARS-CoV-2/FLU/RSV plus assay is intended as an aid in the diagnosis of influenza from Nasopharyngeal swab specimens and should not be used as a sole basis for treatment. Nasal washings and aspirates  are unacceptable for Xpert Xpress SARS-CoV-2/FLU/RSV testing.  Fact Sheet for Patients: EntrepreneurPulse.com.au  Fact Sheet for Healthcare Providers: IncredibleEmployment.be  This test is not yet approved or cleared by the [place] FDA and has been authorized for detection and/or diagnosis of SARS-CoV-2 by FDA under an Emergency Use Authorization (EUA). This EUA will remain in effect (meaning this test can be used) for the duration of the COVID-19 declaration under Section 564(b)(1) of the Act, 21 U.S.C. section 360bbb-3(b)(1), unless the authorization is terminated or revoked.  Performed at [facility], [address] [place], [place] [address]       Radiology Studies: DG Chest 2 View  Result Date: 04/01/2021 CLINICAL DATA:  Acute chest pain for 1 month. EXAM: CHEST - 2 VIEW COMPARISON:  04/29/2020 chest CT, 03/15/2015 radiograph and prior studies FINDINGS: The cardiomediastinal silhouette is unremarkable. Chronic interstitial opacities are noted. There is no evidence of focal airspace disease, pulmonary edema, suspicious pulmonary nodule/mass, pleural effusion, or pneumothorax. No acute bony abnormalities are identified. IMPRESSION: 1. No evidence of acute cardiopulmonary disease. 2. Chronic interstitial opacities. Electronically Signed   By: Margarette Canada M.D.   On: 04/01/2021 17:57     Scheduled Meds:  amLODipine  5 mg Oral Daily   aspirin EC  81 mg Oral Daily   buPROPion  150 mg Oral Daily   enoxaparin (LOVENOX) injection  30 mg Subcutaneous Q24H   fluticasone furoate-vilanterol  1 puff Inhalation Daily   [START ON 04/03/2021] levothyroxine  150 mcg Oral Q0600   pantoprazole  40 mg Oral Daily   valACYclovir  1,000 mg Oral Daily   Continuous Infusions:   LOS: 1 day    Roxan Hockey M.D on 04/02/2021 at 10:34 AM  Go to [email or web address] - for contact info  [facility] - Office  [phone]  If 7PM-7AM, please contact  night-coverage [email or web address] Password [facility] 04/02/2021, 10:34 AM

## 2021-04-02 NOTE — Assessment & Plan Note (Signed)
Continue Protonix °

## 2021-04-02 NOTE — Assessment & Plan Note (Signed)
Troponin x2 - 21 > 26 ?This may be due to type II demand ischemia ?Continue telemetry ?Continue to monitor troponin ?

## 2021-04-03 ENCOUNTER — Telehealth (HOSPITAL_COMMUNITY): Payer: Self-pay | Admitting: Emergency Medicine

## 2021-04-03 ENCOUNTER — Inpatient Hospital Stay (HOSPITAL_COMMUNITY): Payer: Medicare Other

## 2021-04-03 ENCOUNTER — Encounter (HOSPITAL_COMMUNITY): Payer: Self-pay | Admitting: Internal Medicine

## 2021-04-03 ENCOUNTER — Encounter (HOSPITAL_COMMUNITY)
Admission: EM | Disposition: A | Discharge status: Home / Self Care | Payer: Self-pay | Source: Home / Self Care | Attending: Cardiothoracic Surgery

## 2021-04-03 DIAGNOSIS — I200 Unstable angina: Secondary | ICD-10-CM | POA: Diagnosis not present

## 2021-04-03 DIAGNOSIS — I2510 Atherosclerotic heart disease of native coronary artery without angina pectoris: Secondary | ICD-10-CM | POA: Diagnosis not present

## 2021-04-03 DIAGNOSIS — I350 Nonrheumatic aortic (valve) stenosis: Secondary | ICD-10-CM | POA: Diagnosis not present

## 2021-04-03 DIAGNOSIS — I6523 Occlusion and stenosis of bilateral carotid arteries: Secondary | ICD-10-CM

## 2021-04-03 DIAGNOSIS — R079 Chest pain, unspecified: Secondary | ICD-10-CM

## 2021-04-03 DIAGNOSIS — N183 Chronic kidney disease, stage 3 unspecified: Secondary | ICD-10-CM

## 2021-04-03 LAB — LIPID PANEL
Cholesterol: 105 mg/dL (ref 0–200)
HDL: 33 mg/dL — ABNORMAL LOW (ref >40)
LDL Cholesterol: 47 mg/dL (ref 0–99)
Total CHOL/HDL Ratio: 3.2 RATIO
Triglycerides: 126 mg/dL (ref <150)
VLDL: 25 mg/dL (ref 0–40)

## 2021-04-03 LAB — CBC
HCT: 36 % (ref 36.0–46.0)
Hemoglobin: 11.9 g/dL — ABNORMAL LOW (ref 12.0–15.0)
MCH: 32.2 pg (ref 26.0–34.0)
MCHC: 33.1 g/dL (ref 30.0–36.0)
MCV: 97.3 fL (ref 80.0–100.0)
Platelets: 234 10*3/uL (ref 150–400)
RBC: 3.7 MIL/uL — ABNORMAL LOW (ref 3.87–5.11)
RDW: 13.2 % (ref 11.5–15.5)
WBC: 11.6 10*3/uL — ABNORMAL HIGH (ref 4.0–10.5)
nRBC: 0 % (ref 0.0–0.2)

## 2021-04-03 LAB — BASIC METABOLIC PANEL
Anion gap: 7 (ref 5–15)
Anion gap: 7 (ref 5–15)
BUN: 22 mg/dL (ref 8–23)
BUN: 21 mg/dL (ref 8–23)
CO2: 24 mmol/L (ref 22–32)
CO2: 23 mmol/L (ref 22–32)
Calcium: 8.7 mg/dL — ABNORMAL LOW (ref 8.9–10.3)
Calcium: 9 mg/dL (ref 8.9–10.3)
Chloride: 107 mmol/L (ref 98–111)
Chloride: 107 mmol/L (ref 98–111)
Creatinine, Ser: 1.83 mg/dL — ABNORMAL HIGH (ref 0.44–1.00)
Creatinine, Ser: 1.66 mg/dL — ABNORMAL HIGH (ref 0.44–1.00)
GFR, Estimated: 30 mL/min — ABNORMAL LOW (ref >60)
GFR, Estimated: 34 mL/min — ABNORMAL LOW (ref >60)
Glucose, Bld: 105 mg/dL — ABNORMAL HIGH (ref 70–99)
Glucose, Bld: 105 mg/dL — ABNORMAL HIGH (ref 70–99)
Potassium: 4.4 mmol/L (ref 3.5–5.1)
Potassium: 4.4 mmol/L (ref 3.5–5.1)
Sodium: 138 mmol/L (ref 135–145)
Sodium: 137 mmol/L (ref 135–145)

## 2021-04-03 LAB — URINALYSIS, COMPLETE (UACMP) WITH MICROSCOPIC
Bilirubin Urine: NEGATIVE
Glucose, UA: NEGATIVE mg/dL
Ketones, ur: NEGATIVE mg/dL
Leukocytes,Ua: NEGATIVE
Nitrite: NEGATIVE
Protein, ur: NEGATIVE mg/dL
Specific Gravity, Urine: 1.028 (ref 1.005–1.030)
pH: 5 (ref 5.0–8.0)

## 2021-04-03 LAB — ECHOCARDIOGRAM COMPLETE
AR max vel: 1.37 cm2
AV Area VTI: 1.45 cm2
AV Area mean vel: 1.46 cm2
AV Mean grad: 4.5 mmHg
AV Peak grad: 8.1 mmHg
Ao pk vel: 1.43 m/s
Area-P 1/2: 2.34 cm2
Calc EF: 58.1 %
Height: 63 in
MV VTI: 1.28 cm2
S' Lateral: 4.1 cm
Single Plane A2C EF: 58.7 %
Single Plane A4C EF: 57.7 %
Weight: 1960 oz

## 2021-04-03 LAB — FERRITIN: Ferritin: 65 ng/mL (ref 11–307)

## 2021-04-03 LAB — IRON AND TIBC
Iron: 40 ug/dL (ref 28–170)
Saturation Ratios: 14 % (ref 10.4–31.8)
TIBC: 283 ug/dL (ref 250–450)
UIBC: 243 ug/dL

## 2021-04-03 LAB — FOLATE: Folate: 13.9 ng/mL (ref >5.9)

## 2021-04-03 LAB — VITAMIN B12: Vitamin B-12: 176 pg/mL — ABNORMAL LOW (ref 180–914)

## 2021-04-03 LAB — SURGICAL PCR SCREEN
MRSA, PCR: NEGATIVE
Staphylococcus aureus: NEGATIVE

## 2021-04-03 LAB — TROPONIN I (HIGH SENSITIVITY): Troponin I (High Sensitivity): 50 ng/L — ABNORMAL HIGH (ref <18)

## 2021-04-03 SURGERY — LEFT HEART CATH AND CORONARY ANGIOGRAPHY
Anesthesia: LOCAL

## 2021-04-03 MED ORDER — HEPARIN (PORCINE) IN NACL 1000-0.9 UT/500ML-% IV SOLN
INTRAVENOUS | Status: DC | PRN
  Administered 2021-04-03 (×2): 500 mL

## 2021-04-03 MED ORDER — FENTANYL CITRATE (PF) 100 MCG/2ML IJ SOLN
INTRAMUSCULAR | Status: DC | PRN
  Administered 2021-04-03 (×2): 25 ug via INTRAVENOUS

## 2021-04-03 MED ORDER — HEPARIN (PORCINE) IN NACL 1000-0.9 UT/500ML-% IV SOLN
INTRAVENOUS | Status: AC
  Filled 2021-04-03: qty 1000

## 2021-04-03 MED ORDER — GUAIFENESIN ER 600 MG PO TB12
600.0000 mg | ORAL_TABLET | Freq: Two times a day (BID) | ORAL | Status: DC
  Administered 2021-04-03 – 2021-04-04 (×3): 600 mg via ORAL
  Filled 2021-04-03 (×3): qty 1

## 2021-04-03 MED ORDER — LIDOCAINE HCL (PF) 1 % IJ SOLN
INTRAMUSCULAR | Status: AC
  Filled 2021-04-03: qty 30

## 2021-04-03 MED ORDER — LIDOCAINE HCL (PF) 1 % IJ SOLN
INTRAMUSCULAR | Status: DC | PRN
  Administered 2021-04-03: 2 mL

## 2021-04-03 MED ORDER — FENTANYL CITRATE (PF) 100 MCG/2ML IJ SOLN
INTRAMUSCULAR | Status: AC
  Filled 2021-04-03: qty 2

## 2021-04-03 MED ORDER — HEPARIN (PORCINE) 25000 UT/250ML-% IV SOLN
1100.0000 [IU]/h | INTRAVENOUS | Status: DC
  Administered 2021-04-03 – 2021-04-05 (×2): 1100 [IU]/h via INTRAVENOUS
  Filled 2021-04-03 (×2): qty 250

## 2021-04-03 MED ORDER — HEPARIN SODIUM (PORCINE) 1000 UNIT/ML IJ SOLN
INTRAMUSCULAR | Status: AC
  Filled 2021-04-03: qty 10

## 2021-04-03 MED ORDER — MIDAZOLAM HCL 2 MG/2ML IJ SOLN
INTRAMUSCULAR | Status: DC | PRN
  Administered 2021-04-03: 1 mg via INTRAVENOUS
  Administered 2021-04-03: 2 mg via INTRAVENOUS

## 2021-04-03 MED ORDER — IOHEXOL 350 MG/ML SOLN
INTRAVENOUS | Status: DC | PRN
  Administered 2021-04-03: 35 mL

## 2021-04-03 MED ORDER — VERAPAMIL HCL 2.5 MG/ML IV SOLN
INTRAVENOUS | Status: DC | PRN
  Administered 2021-04-03: 10 mL via INTRA_ARTERIAL

## 2021-04-03 MED ORDER — HEPARIN SODIUM (PORCINE) 1000 UNIT/ML IJ SOLN
INTRAMUSCULAR | Status: DC | PRN
  Administered 2021-04-03: 3000 [IU] via INTRAVENOUS

## 2021-04-03 MED ORDER — MIDAZOLAM HCL 2 MG/2ML IJ SOLN
INTRAMUSCULAR | Status: AC
  Filled 2021-04-03: qty 2

## 2021-04-03 MED ORDER — VERAPAMIL HCL 2.5 MG/ML IV SOLN
INTRAVENOUS | Status: AC
  Filled 2021-04-03: qty 2

## 2021-04-03 SURGICAL SUPPLY — 9 items
CATH INFINITI 5 FR JL3.5 (CATHETERS) ×1 IMPLANT
CATH INFINITI JR4 5F (CATHETERS) ×1 IMPLANT
DEVICE RAD COMP TR BAND LRG (VASCULAR PRODUCTS) ×1 IMPLANT
GUIDEWIRE INQWIRE 1.5J.035X260 (WIRE) IMPLANT
INQWIRE 1.5J .035X260CM (WIRE) ×2
KIT HEART LEFT (KITS) ×2 IMPLANT
PACK CARDIAC CATHETERIZATION (CUSTOM PROCEDURE TRAY) ×2 IMPLANT
TRANSDUCER W/STOPCOCK (MISCELLANEOUS) ×2 IMPLANT
TUBING CIL FLEX 10 FLL-RA (TUBING) ×2 IMPLANT

## 2021-04-03 NOTE — Progress Notes (Signed)
ANTICOAGULATION CONSULT NOTE - Initial Consult ? ?Pharmacy Consult for IV heparin ?Indication: CAD awaiting TCTS consult ? ?No Known Allergies ? ?Patient Measurements: ?Height: '5\' 3"'$  (160 cm) ?Weight: 55.6 kg (122 lb 8 oz) ?IBW/kg (Calculated) : 52.4 ?Heparin Dosing Weight: 55.6 kg ? ?Vital Signs: ?Temp: 98.7 ?F (37.1 ?C) (03/13 1517) ?Temp Source: Oral (03/13 1517) ?BP: 126/75 (03/13 1517) ?Pulse Rate: 79 (03/13 1517) ? ?Labs: ?Recent Labs  ?  04/01/21 ?1826 04/01/21 ?2032 04/02/21 ?4332 04/02/21 ?0532 04/03/21 ?0132 04/03/21 ?9518 04/03/21 ?1033  ?HGB 11.9*  --   --   --  11.9*  --   --   ?HCT 36.8  --   --   --  36.0  --   --   ?PLT 236  --   --   --  234  --   --   ?CREATININE 1.66*  --   --   --   --  1.83* 1.66*  ?TROPONINIHS 21*   < > 43* 51*  --  50*  --   ? < > = values in this interval not displayed.  ? ? ?Estimated Creatinine Clearance: 27.2 mL/min (A) (by C-G formula based on SCr of 1.66 mg/dL (H)). ? ? ?Medical History: ?Past Medical History:  ?Diagnosis Date  ? Carotid artery occlusion   ? DDD (degenerative disc disease)   ? Depression   ? DVT (deep venous thrombosis) ([place]) 2008  ? right leg  ? Hypertension   ? Thyroid disease   ? ? ?Medications:  ?Infusions:  ? sodium chloride 55.6 mL/hr at 04/03/21 1237  ? heparin    ? ? ?Assessment: ?66 yo female s/p cath today with multivessel dx, TCTS consult pending.  Pharmacy asked to initiate anticoagulation with IV heparin 8 hrs after sheath removed.  Sheath out ~ 3 PM. ? ?Goal of Therapy:  ?Heparin level 0.3-0.7 units/ml ?Monitor platelets by anticoagulation protocol: Yes ?  ?Plan:  ?Start IV heparin at rate of 1100 units/hr at 11 PM tonight. ?Check heparin level 8 hrs after gtt starts. ?Daily heparin level and CBC. ?F/u plans for surgery. ? ?[place] Crane, Pharm D, BCPS, BCCP ?Clinical Pharmacist ? 04/03/2021 3:24 PM  ? ?[facility] pharmacy phone numbers are listed on [email or web address] ? ? ?

## 2021-04-03 NOTE — Anesthesia Preprocedure Evaluation (Deleted)
Anesthesia Evaluation  ? ? ?Reviewed: ?Allergy & Precautions, Patient's Chart, lab work & pertinent test results, Unable to perform ROS - Chart review only ? ?Airway ? ? ? ? ? ? ? Dental ?  ?Pulmonary ?Current Smoker,  ?  ? ? ? ? ? ? ? Cardiovascular ?hypertension, Pt. on medications and Pt. on home beta blockers ? ? ?Echo 03/2021 ? ??1. Left ventricular ejection fraction, by estimation, is 60 to 65%. The left ventricle has normal function. The left ventricle has no regional wall motion abnormalities. There is mild left ventricular hypertrophy of  ?the basal-septal segment. Left  ?ventricular diastolic parameters are consistent with Grade I diastolic  ?dysfunction (impaired relaxation).  ??2. Right ventricular systolic function is normal. The right ventricular size is normal. There is normal pulmonary artery systolic pressure. The estimated right ventricular systolic pressure is 10.0 mmHg.  ??3. The mitral valve is normal in structure. Trivial mitral valve regurgitation. No evidence of mitral stenosis. Moderate mitral annular calcification.  ??4. The aortic valve is tricuspid. Aortic valve regurgitation is not visualized. Mild aortic valve stenosis. Aortic valve area, by VTI measures 1.45 cm?Marland Kitchen Aortic valve mean gradient measures 4.5 mmHg. Aortic valve Vmax  ?measures 1.42 m/s. Despite a low mean AVG, the SVI is low (<34) and AVA is calculated at 1.45cm2. Suspect  ?paradoxical low flow low gradient mild AS.  ??5. The inferior vena cava is normal in size with greater than 50% respiratory variability, suggesting right atrial pressure of 3 mmHg.  ?  ?Neuro/Psych ?PSYCHIATRIC DISORDERS Depression negative neurological ROS ?   ? GI/Hepatic ?Neg liver ROS, GERD  ,  ?Endo/Other  ?negative endocrine ROS ? Renal/GU ?negative Renal ROS  ? ?  ?Musculoskeletal ? ?(+) Arthritis ,  ? Abdominal ?  ?Peds ? Hematology ?negative hematology ROS ?(+)   ?Anesthesia Other Findings ? ?  Reproductive/Obstetrics ? ?  ? ? ? ? ? ? ? ? ? ? ? ? ? ?  ?  ? ? ? ? ? ? ? ? ?Anesthesia Physical ?Anesthesia Plan ? ?ASA: 4 ? ?Anesthesia Plan: General  ? ?Post-op Pain Management: Minimal or no pain anticipated  ? ?Induction: Intravenous ? ?PONV Risk Score and Plan: 3 and Midazolam and Treatment may vary due to age or medical condition ? ?Airway Management Planned: Oral ETT ? ?Additional Equipment: Arterial line, CVP, PA Cath, TEE and Ultrasound Guidance Line Placement ? ?Intra-op Plan:  ? ?Post-operative Plan: Extubation in OR ? ?Informed Consent:  ? ?Plan Discussed with:  ? ?Anesthesia Plan Comments:   ? ? ? ? ? ? ?Anesthesia Quick Evaluation ? ?

## 2021-04-03 NOTE — Telephone Encounter (Signed)
Pt scheduled for CCTA tomorrow 04/04/21 however currently inpatient at [facility] pending heart cath today/tomorrow depending on labs (kidney function). ? ?Marchia Bond RN Navigator Cardiac Imaging ?[facility] ?[phone] Office  ?[phone] Cell ? ?

## 2021-04-03 NOTE — Interval H&P Note (Signed)
Cath Lab Visit (complete for each Cath Lab visit) ? ?Clinical Evaluation Leading to the Procedure:  ? ?ACS: Yes.   ? ?Non-ACS:   ? ?Anginal Classification: CCS IV ? ?Anti-ischemic medical therapy: Minimal Therapy (1 class of medications) ? ?Non-Invasive Test Results: No non-invasive testing performed ? ?Prior CABG: No previous CABG ? ? ? ? ? ?History and Physical Interval Note: ? ?04/03/2021 ?2:20 PM ? ?Samantha Osborne  has presented today for surgery, with the diagnosis of unstable angina.  The various methods of treatment have been discussed with the patient and family. After consideration of risks, benefits and other options for treatment, the patient has consented to  Procedure(s): ?LEFT HEART CATH AND CORONARY ANGIOGRAPHY (N/A) as a surgical intervention.  The patient's history has been reviewed, patient examined, no change in status, stable for surgery.  I have reviewed the patient's chart and labs.  Questions were answered to the patient's satisfaction.   ? ? ?Larae Grooms ? ? ?

## 2021-04-03 NOTE — Progress Notes (Addendum)
? ?Progress Note ? ?Patient Name: Samantha Osborne ?Date of Encounter: 04/03/2021 ? ?[facility] Cardiologist: Carlyle Dolly, MD  ? ?Subjective  ? ?Not currently having chest pain.  ? ?Inpatient Medications  ?  ?Scheduled Meds: ? amLODipine  2.5 mg Oral Daily  ? aspirin EC  81 mg Oral Daily  ? buPROPion  150 mg Oral Daily  ? enoxaparin (LOVENOX) injection  30 mg Subcutaneous Q24H  ? fluticasone furoate-vilanterol  1 puff Inhalation Daily  ? levothyroxine  150 mcg Oral Q0600  ? metoprolol tartrate  12.5 mg Oral BID  ? pantoprazole  40 mg Oral Daily  ? sodium chloride flush  3 mL Intravenous Q12H  ? traZODone  50 mg Oral QHS  ? valACYclovir  1,000 mg Oral Daily  ? ?Continuous Infusions: ? sodium chloride 166 mL/hr at 04/03/21 0425  ? sodium chloride    ? sodium chloride 1 mL/kg/hr (04/03/21 0529)  ? ?PRN Meds: ?sodium chloride, albuterol, hydrALAZINE, HYDROcodone-acetaminophen, Muscle Rub, nitroGLYCERIN, oxyCODONE, sodium chloride flush  ? ?Vital Signs  ?  ?Vitals:  ? 04/02/21 1444 04/02/21 2046 04/03/21 0016 04/03/21 0457  ?BP:  126/71 (!) 98/51 108/64  ?Pulse: 72 93 81 69  ?Resp:  '19 17 18  '$ ?Temp:  99.1 ?F (37.3 ?C) 97.9 ?F (36.6 ?C) 98.2 ?F (36.8 ?C)  ?TempSrc:  Oral Oral Oral  ?SpO2: 98% 96% 95% 95%  ?Weight:      ?Height:      ? ? ?Intake/Output Summary (Last 24 hours) at 04/03/2021 0718 ?Last data filed at 04/03/2021 6568 ?Gross per 24 hour  ?Intake 1025.28 ml  ?Output --  ?Net 1025.28 ml  ? ?Last 3 Weights 04/02/2021 04/01/2021 03/23/2021  ?Weight (lbs) 122 lb 8 oz 125 lb 128 lb  ?Weight (kg) 55.566 kg 56.7 kg 58.06 kg  ?   ? ?Telemetry  ?  ?Sinus in the 70s - Personally Reviewed ? ?ECG  ?  ?No new tracings - Personally Reviewed ? ?Physical Exam  ? ?GEN: No acute distress.   ?Neck: No JVD ?Cardiac: RRR, no murmurs, rubs, or gallops.  ?Respiratory: Clear to auscultation bilaterally. ?GI: Soft, nontender, non-distended  ?MS: No edema; No deformity. ?Neuro:  Nonfocal  ?Psych: Normal affect  ? ?Labs  ?  ?High  Sensitivity Troponin:   ?Recent Labs  ?Lab 04/01/21 ?1826 04/01/21 ?2032 04/02/21 ?1275 04/02/21 ?0532  ?TROPONINIHS 21* 26* 43* 51*  ?   ?Chemistry ?Recent Labs  ?Lab 03/30/21 ?0813 04/01/21 ?1826 04/02/21 ?1700  ?NA 137 136  --   ?K 4.8 4.7  --   ?CL 102 105  --   ?CO2 22 24  --   ?GLUCOSE 114* 118*  --   ?BUN 24 28*  --   ?CREATININE 1.57* 1.66*  --   ?CALCIUM 9.5 8.5*  --   ?MG  --   --  1.9  ?GFRNONAA  --  49*  --   ?ANIONGAP  --  7  --   ?  ?Lipids  ?Recent Labs  ?Lab 04/03/21 ?0132  ?CHOL 105  ?TRIG 126  ?HDL 33*  ?[place]ons 47  ?CHOLHDL 3.2  ?  ?Hematology ?Recent Labs  ?Lab 04/01/21 ?1826 04/03/21 ?0132  ?WBC 11.9* 11.6*  ?RBC 3.65* 3.70*  ?HGB 11.9* 11.9*  ?HCT 36.8 36.0  ?MCV 100.8* 97.3  ?MCH 32.6 32.2  ?MCHC 32.3 33.1  ?RDW 13.2 13.2  ?PLT 236 234  ? ?Thyroid No results for input(s): TSH, FREET4 in the last 168 hours.  ?BNPNo  results for input(s): BNP, PROBNP in the last 168 hours.  ?DDimer No results for input(s): DDIMER in the last 168 hours.  ? ?Radiology  ?  ?DG Chest 2 View ? ?Result Date: 04/01/2021 ?CLINICAL DATA:  Acute chest pain for 1 month. EXAM: CHEST - 2 VIEW COMPARISON:  04/29/2020 chest CT, 03/15/2015 radiograph and prior studies FINDINGS: The cardiomediastinal silhouette is unremarkable. Chronic interstitial opacities are noted. There is no evidence of focal airspace disease, pulmonary edema, suspicious pulmonary nodule/mass, pleural effusion, or pneumothorax. No acute bony abnormalities are identified. IMPRESSION: 1. No evidence of acute cardiopulmonary disease. 2. Chronic interstitial opacities. Electronically Signed   By: Margarette Canada M.D.   On: 04/01/2021 17:57   ? ?Cardiac Studies  ? ?Left heart cath pending ? ? ?Echo pending ? ? ?Carotid artery [place] 10/2020: ?Right Carotid: Velocities in the right ICA are consistent with a 40-59% stenosis.  ? ?Left Carotid: Velocities in the left ICA are consistent with a 60-79%  ?stenosis.  The ECA appears >50% stenosed.  ? ?Patient Profile  ?   ?66  y.o. female with a hx of chest pain (coronary calcification by prior CT, no ischemia by NST in 03/2015), palpitations (PAC's and brief NSVT by prior monitor), bicuspid aortic valve (mild stenosis by echo in 02/2018), carotid artery stenosis, HLD (intolerant to statins and on Repatha), Stage 3 CKD, history of DVT, tobacco use and family history of CAD (patient's mom had CABG in her 30's) who was seen 04/02/2021 for the evaluation of chest pain  ? ?Assessment & Plan  ?  ?Chest pain ?- concerning for angina ?- CE mildly elevated, trended to 51 ?- planned for definitive angiography today, but creatinine bumped ?- will allow fluids to run for 5 hrs and recheck BMP --> if baseline/trending down, may consider LHC today without LV gram ?- continue ASA, BB and repatha, ARB on hold ? ? ?Hyperlipidemia with LDL goal < 70 ?04/03/2021: Cholesterol 105; HDL 33; LDL Cholesterol 47; Triglycerides 126; VLDL 25 ?Intolerant to statins but doing well on repatha ? ? ?CKD stage III ?- sCr today trending up: 1.83 (1.66) ?- baseline seems to be 1.5-1.6 ?- pre-cath IVF started at 0530, will redraw BMP at 1100 ?- if sCr above 1.9, will defer heart cath to tomorrow ? ? ?Bicuspid aortic valve ?- on echo 02/2018 with mild AS ?- repeat echo pending ? ? ?Carotid artery stenosis ?- last study in 10/2020 with L > R nonobstructive disease ?- risk factor modification, no syncope ?   ? ?Anemia ?Hb stable at 11.9 ? ? ?For questions or updates, please contact [place] ?Please consult [email or web address] for contact info under  ? ? ?Signed, ?Ledora Bottcher, PA  ?04/03/2021, 7:18 AM   ? ?Patient seen, examined. Available data reviewed. Agree with findings, assessment, and plan as outlined by Doreene Adas, PA.  The patient is independently interviewed and examined.  She is alert, oriented, no distress.  Vital signs as above.  HEENT is normal, JVP is normal, carotid upstrokes are 2+ bilaterally, lungs are clear to auscultation bilaterally, heart is regular  rate and rhythm with a soft systolic ejection murmur best heard at the left lower sternal border, abdomen is soft and nontender, extremities have no edema, right radial pulses 2+. ? ?The patient presents with symptoms concerning for anginal chest pain.  Her troponin is mildly elevated.  Cardiac catheterization is indicated. I have reviewed the risks, indications, and alternatives to cardiac catheterization, possible angioplasty, and stenting with the  patient. Risks include but are not limited to bleeding, infection, vascular injury, stroke, myocardial infection, arrhythmia, kidney injury, radiation-related injury in the case of prolonged fluoroscopy use, emergency cardiac surgery, and death. The patient understands the risks of serious complication is 1-2 in 3007 with diagnostic cardiac cath and 1-2% or less with angioplasty/stenting.  Her creatinine is up slightly from baseline.  We will continue her IV fluids and repeat a creatinine a little bit later today.  I would anticipate moving forward with cardiac catheterization as long as her creatinine remained stable on recheck.  We will plan for a limited contrast study as able.  Other problems as outlined above I am in agreement. ? ?Sherren Mocha, M.D. ?04/03/2021 ?9:44 AM ? ? ?

## 2021-04-03 NOTE — Progress Notes (Addendum)
PROGRESS NOTE    Samantha Osborne  GUY:[id number] DOB: September 09, 1955 DOA: 04/01/2021 PCP: Redmond School, MD   Brief Narrative:  Briefly patient admitted for atypical chest pain with high test probability, cardiology consulted at [place], tentatively planned catheterization tomorrow pending clinical course, patient's creatinine is likely around 1.5, given outpatient labs suspect CKD 3B.  Assessment & Plan:   Principal Problem:   Exertional chest pain Active Problems:   Elevated troponin   GERD (gastroesophageal reflux disease)   ILD (interstitial lung disease) (HCC)   Rheumatoid arthritis (HCC)   Essential hypertension   Hyperlipidemia   Chest pain, rule out ACS -Cardiology following, appreciate insight recommendations -Plan for cath later this afternoon, preliminary results indicate patient may need further evaluation with CT surgery given multivessel disease  CKD 3B -baseline around 1.5, follow closely given need for contrast with today's study  Hypertension, essential -stable, continue home meds including amlodipine metoprolol  Tobacco abuse, COPD and ILD history, stable -continue home inhalers, no acute exacerbation, no indication for steroids or supplemental oxygen above baseline  Depression, anxiety-continue home Wellbutrin  Anemia of chronic disease in the setting of above CKD-stable, follow repeat labs  GERD -continue PPI  Consultants:  Cardiology  Procedures:  Heart cath 04/03/2021  Antimicrobials:  None  Objective: Vitals:   04/03/21 1447 04/03/21 1452 04/03/21 1457 04/03/21 1517  BP: 114/65 114/70 140/89 126/75  Pulse: 94 81 (!) 0 79  Resp:    18  Temp:    98.7 F (37.1 C)  TempSrc:    Oral  SpO2: 97% 98%  98%  Weight:      Height:        Intake/Output Summary (Last 24 hours) at 04/03/2021 1611 Last data filed at 04/03/2021 1500 Gross per 24 hour  Intake 1991.34 ml  Output --  Net 1991.34 ml   Filed Weights   04/01/21 1730 04/02/21 1043   Weight: 56.7 kg 55.6 kg   General:  Pleasantly resting in bed, No acute distress. HEENT:  Normocephalic atraumatic.  Sclerae nonicteric, noninjected.  Extraocular movements intact bilaterally. Neck:  Without mass or deformity.  Trachea is midline. Lungs:  Clear to auscultate bilaterally without rhonchi, wheeze, or rales. Heart:  Regular rate and rhythm.  Without murmurs, rubs, or gallops. Abdomen:  Soft, nontender, nondistended.  Without guarding or rebound. Extremities: Without cyanosis, clubbing, edema, or obvious deformity. Vascular:  Dorsalis pedis and posterior tibial pulses palpable bilaterally. Skin:  Warm and dry, no erythema, no ulcerations.  Data Reviewed: I have personally reviewed following labs and imaging studies  CBC: Recent Labs  Lab 04/01/21 1826 04/03/21 0132  WBC 11.9* 11.6*  HGB 11.9* 11.9*  HCT 36.8 36.0  MCV 100.8* 97.3  PLT 236 563    Basic Metabolic Panel: Recent Labs  Lab 03/30/21 0813 04/01/21 1826 04/02/21 0347 04/03/21 0711 04/03/21 1033  NA 137 136  --  138 137  K 4.8 4.7  --  4.4 4.4  CL 102 105  --  107 107  CO2 22 24  --  24 23  GLUCOSE 114* 118*  --  105* 105*  BUN 24 28*  --  22 21  CREATININE 1.57* 1.66*  --  1.83* 1.66*  CALCIUM 9.5 8.5*  --  8.7* 9.0  MG  --   --  1.9  --   --   PHOS  --   --  3.2  --   --     GFR: Estimated Creatinine Clearance: 27.2 mL/min (  A) (by C-G formula based on SCr of 1.66 mg/dL (H)). Liver Function Tests: No results for input(s): AST, ALT, ALKPHOS, BILITOT, PROT, ALBUMIN in the last 168 hours. No results for input(s): LIPASE, AMYLASE in the last 168 hours. No results for input(s): AMMONIA in the last 168 hours. Coagulation Profile: No results for input(s): INR, PROTIME in the last 168 hours. Cardiac Enzymes: No results for input(s): CKTOTAL, CKMB, CKMBINDEX, TROPONINI in the last 168 hours. BNP (last 3 results) No results for input(s): PROBNP in the last 8760 hours. HbA1C: No results for  input(s): HGBA1C in the last 72 hours. CBG: No results for input(s): GLUCAP in the last 168 hours. Lipid Profile: Recent Labs    04/03/21 0132  CHOL 105  HDL 33*  LDLCALC 47  TRIG 126  CHOLHDL 3.2   Thyroid Function Tests: No results for input(s): TSH, T4TOTAL, FREET4, T3FREE, THYROIDAB in the last 72 hours. Anemia Panel: Recent Labs    04/03/21 0132  VITAMINB12 176*  FOLATE 13.9  FERRITIN 65  TIBC 283  IRON 40   Sepsis Labs: No results for input(s): PROCALCITON, LATICACIDVEN in the last 168 hours.  Recent Results (from the past 240 hour(s))  Resp Panel by RT-PCR (Flu A&B, Covid) Nasopharyngeal Swab     Status: None   Collection Time: 04/01/21 11:32 PM   Specimen: Nasopharyngeal Swab; Nasopharyngeal(NP) swabs in vial transport medium  Result Value Ref Range Status   SARS Coronavirus 2 by RT PCR NEGATIVE NEGATIVE Final    Comment: (NOTE) SARS-CoV-2 target nucleic acids are NOT DETECTED.  The SARS-CoV-2 RNA is generally detectable in upper respiratory specimens during the acute phase of infection. The lowest concentration of SARS-CoV-2 viral copies this assay can detect is 138 copies/mL. A negative result does not preclude SARS-Cov-2 infection and should not be used as the sole basis for treatment or other patient management decisions. A negative result may occur with  improper specimen collection/handling, submission of specimen other than nasopharyngeal swab, presence of viral mutation(s) within the areas targeted by this assay, and inadequate number of viral copies(<138 copies/mL). A negative result must be combined with clinical observations, patient history, and epidemiological information. The expected result is Negative.  Fact Sheet for Patients:  EntrepreneurPulse.com.au  Fact Sheet for Healthcare Providers:  IncredibleEmployment.be  This test is no t yet approved or cleared by the [place] FDA and  has been  authorized for detection and/or diagnosis of SARS-CoV-2 by FDA under an Emergency Use Authorization (EUA). This EUA will remain  in effect (meaning this test can be used) for the duration of the COVID-19 declaration under Section 564(b)(1) of the Act, 21 U.S.C.section 360bbb-3(b)(1), unless the authorization is terminated  or revoked sooner.       Influenza A by PCR NEGATIVE NEGATIVE Final   Influenza B by PCR NEGATIVE NEGATIVE Final    Comment: (NOTE) The Xpert Xpress SARS-CoV-2/FLU/RSV plus assay is intended as an aid in the diagnosis of influenza from Nasopharyngeal swab specimens and should not be used as a sole basis for treatment. Nasal washings and aspirates are unacceptable for Xpert Xpress SARS-CoV-2/FLU/RSV testing.  Fact Sheet for Patients: EntrepreneurPulse.com.au  Fact Sheet for Healthcare Providers: IncredibleEmployment.be  This test is not yet approved or cleared by the [place] FDA and has been authorized for detection and/or diagnosis of SARS-CoV-2 by FDA under an Emergency Use Authorization (EUA). This EUA will remain in effect (meaning this test can be used) for the duration of the COVID-19 declaration under Section  564(b)(1) of the Act, 21 U.S.C. section 360bbb-3(b)(1), unless the authorization is terminated or revoked.  Performed at [facility], [address], [place], [place] [address]           Radiology Studies: DG Chest 2 View  Result Date: 04/01/2021 CLINICAL DATA:  Acute chest pain for 1 month. EXAM: CHEST - 2 VIEW COMPARISON:  04/29/2020 chest CT, 03/15/2015 radiograph and prior studies FINDINGS: The cardiomediastinal silhouette is unremarkable. Chronic interstitial opacities are noted. There is no evidence of focal airspace disease, pulmonary edema, suspicious pulmonary nodule/mass, pleural effusion, or pneumothorax. No acute bony abnormalities are identified. IMPRESSION: 1. No evidence of acute  cardiopulmonary disease. 2. Chronic interstitial opacities. Electronically Signed   By: Margarette Canada M.D.   On: 04/01/2021 17:57   CARDIAC CATHETERIZATION  Result Date: 04/03/2021   Dist LM lesion is 90% stenosed.   Mid RCA to Dist RCA lesion is 75% stenosed.   Prox RCA to Mid RCA lesion is 50% stenosed.   Ost Cx lesion is 75% stenosed.  Circumflex is small.  RIght to left collaterals to the distal circumflex.   Prox LAD to Mid LAD lesion is 25% stenosed.   LV end diastolic pressure is normal.   There is no aortic valve stenosis. Severe three vessel coroanry artery disease.  Cardiac surgery consult. Would restart heparin IV 8 hours post sheath pull.   ECHOCARDIOGRAM COMPLETE  Result Date: 04/03/2021    ECHOCARDIOGRAM REPORT   Patient Name:   Samantha Osborne Date of Exam: 04/03/2021 Medical Rec #:  [id number]     Height:       63.0 in Accession #:    [id number]    Weight:       122.5 lb Date of Birth:  23-Aug-1955     BSA:          1.570 m Patient Age:    66 years      BP:           108/64 mmHg Patient Gender: F             HR:           75 bpm. Exam Location:  Inpatient Procedure: 2D Echo, Cardiac Doppler, Color Doppler, Strain Analysis and 3D Echo Indications:    CP AV disorder  History:        Patient has prior history of Echocardiogram examinations, most                 recent 03/17/2018.  Sonographer:    [place] Referring Phys: [id number] [employer]  1. Left ventricular ejection fraction, by estimation, is 60 to 65%. The left ventricle has normal function. The left ventricle has no regional wall motion abnormalities. There is mild left ventricular hypertrophy of the basal-septal segment. Left ventricular diastolic parameters are consistent with Grade I diastolic dysfunction (impaired relaxation).  2. Right ventricular systolic function is normal. The right ventricular size is normal. There is normal pulmonary artery systolic pressure. The estimated right ventricular systolic pressure  is 75.9 mmHg.  3. The mitral valve is normal in structure. Trivial mitral valve regurgitation. No evidence of mitral stenosis. Moderate mitral annular calcification.  4. The aortic valve is tricuspid. Aortic valve regurgitation is not visualized. Mild aortic valve stenosis. Aortic valve area, by VTI measures 1.45 cm. Aortic valve mean gradient measures 4.5 mmHg. Aortic valve Vmax measures 1.42 m/s. Despite a low mean AVG, the SVI is low (<34) and AVA is  calculated at 1.45cm2. Suspect paradoxical low flow low gradient mild AS.  5. The inferior vena cava is normal in size with greater than 50% respiratory variability, suggesting right atrial pressure of 3 mmHg. FINDINGS  Left Ventricle: Left ventricular ejection fraction, by estimation, is 60 to 65%. The left ventricle has normal function. The left ventricle has no regional wall motion abnormalities. Global longitudinal strain performed but not reported based on interpreter judgement due to suboptimal tracking. The left ventricular internal cavity size was normal in size. There is mild left ventricular hypertrophy of the basal-septal segment. Left ventricular diastolic parameters are consistent with Grade I diastolic dysfunction (impaired relaxation). Normal left ventricular filling pressure. Right Ventricle: The right ventricular size is normal. No increase in right ventricular wall thickness. Right ventricular systolic function is normal. There is normal pulmonary artery systolic pressure. The tricuspid regurgitant velocity is 2.37 m/s, and  with an assumed right atrial pressure of 3 mmHg, the estimated right ventricular systolic pressure is 78.6 mmHg. Left Atrium: Left atrial size was normal in size. Right Atrium: Right atrial size was normal in size. Pericardium: There is no evidence of pericardial effusion. Mitral Valve: The mitral valve is normal in structure. Moderate mitral annular calcification. Trivial mitral valve regurgitation. No evidence of mitral valve  stenosis. MV peak gradient, 4.9 mmHg. The mean mitral valve gradient is 2.0 mmHg. Tricuspid Valve: The tricuspid valve is normal in structure. Tricuspid valve regurgitation is trivial. No evidence of tricuspid stenosis. Aortic Valve: The aortic valve is tricuspid. Aortic valve regurgitation is not visualized. Mild aortic stenosis is present. Aortic valve mean gradient measures 4.5 mmHg. Aortic valve peak gradient measures 8.1 mmHg. Aortic valve area, by VTI measures 1.45 cm. Pulmonic Valve: The pulmonic valve was normal in structure. Pulmonic valve regurgitation is trivial. No evidence of pulmonic stenosis. Aorta: The aortic root is normal in size and structure. Venous: The inferior vena cava is normal in size with greater than 50% respiratory variability, suggesting right atrial pressure of 3 mmHg. IAS/Shunts: No atrial level shunt detected by color flow Doppler.  LEFT VENTRICLE PLAX 2D LVIDd:         4.50 cm     Diastology LVIDs:         4.10 cm     LV e' medial:    4.37 cm/s LV PW:         0.90 cm     LV E/e' medial:  14.5 LV IVS:        1.20 cm     LV e' lateral:   6.06 cm/s LVOT diam:     1.90 cm     LV E/e' lateral: 10.5 LV SV:         39 LV SV Index:   25          2D Longitudinal Strain LVOT Area:     2.84 cm    2D Strain GLS (A2C):   -18.2 %                            2D Strain GLS (A3C):   -11.9 %                            2D Strain GLS (A4C):   -12.7 % LV Volumes (MOD)           2D Strain GLS Avg:     -14.3 % LV vol  d, MOD A2C: 38.5 ml LV vol d, MOD A4C: 55.8 ml LV vol s, MOD A2C: 15.9 ml LV vol s, MOD A4C: 23.6 ml LV SV MOD A2C:     22.6 ml LV SV MOD A4C:     55.8 ml LV SV MOD BP:      27.4 ml RIGHT VENTRICLE RV Basal diam:  3.30 cm RV Mid diam:    2.20 cm TAPSE (M-mode): 1.6 cm LEFT ATRIUM             Index        RIGHT ATRIUM           Index LA Vol (A2C):   37.3 ml 23.76 ml/m  RA Area:     10.60 cm LA Vol (A4C):   22.3 ml 14.20 ml/m  RA Volume:   21.60 ml  13.76 ml/m LA Biplane Vol: 31.2 ml 19.87  ml/m  AORTIC VALVE                    PULMONIC VALVE AV Area (Vmax):    1.37 cm     PV Vmax:          0.87 m/s AV Area (Vmean):   1.46 cm     PV Vmean:         61.100 cm/s AV Area (VTI):     1.45 cm     PV VTI:           0.180 m AV Vmax:           142.50 cm/s  PV Peak grad:     3.0 mmHg AV Vmean:          94.400 cm/s  PV Mean grad:     2.0 mmHg AV VTI:            0.268 m      PR End Diast Vel: 5.48 msec AV Peak Grad:      8.1 mmHg AV Mean Grad:      4.5 mmHg LVOT Vmax:         68.90 cm/s LVOT Vmean:        48.500 cm/s LVOT VTI:          0.137 m LVOT/AV VTI ratio: 0.51  AORTA Ao Root diam: 2.50 cm Ao Asc diam:  3.30 cm MITRAL VALVE               TRICUSPID VALVE MV Area (PHT): 2.34 cm    TR Peak grad:   22.5 mmHg MV Area VTI:   1.28 cm    TR Vmax:        237.00 cm/s MV Peak grad:  4.9 mmHg MV Mean grad:  2.0 mmHg    SHUNTS MV Vmax:       1.11 m/s    Systemic VTI:  0.14 m MV Vmean:      62.9 cm/s   Systemic Diam: 1.90 cm MV Decel Time: 324 msec MV E velocity: 63.50 cm/s MV A velocity: 92.60 cm/s MV E/A ratio:  0.69 Fransico Him MD Electronically signed by Fransico Him MD Signature Date/Time: 04/03/2021/10:05:18 AM    Final     Scheduled Meds:  amLODipine  2.5 mg Oral Daily   aspirin EC  81 mg Oral Daily   buPROPion  150 mg Oral Daily   fluticasone furoate-vilanterol  1 puff Inhalation Daily   levothyroxine  150 mcg Oral Q0600   metoprolol tartrate  12.5 mg Oral BID  pantoprazole  40 mg Oral Daily   sodium chloride flush  3 mL Intravenous Q12H   traZODone  50 mg Oral QHS   valACYclovir  1,000 mg Oral Daily   Continuous Infusions:  sodium chloride 55.6 mL/hr at 04/03/21 1308   heparin       LOS: 2 days   Time spent: 76mn  Lillith Mcneff C Myleah Cavendish, DO [facility]  If 7PM-7AM, please contact night-coverage [email or web address]  04/03/2021, 4:11 PM

## 2021-04-03 NOTE — Progress Notes (Signed)
RT attempted to obtain ABG, unable to.  Pt doesn't want to let another RT attempt to obtain ABG. RN made aware.

## 2021-04-03 NOTE — Plan of Care (Signed)
?  Problem: Education: ?Goal: Knowledge of General Education information will improve ?Description: Including pain rating scale, medication(s)/side effects and non-pharmacologic comfort measures ?Outcome: Progressing ?  ?Problem: Clinical Measurements: ?Goal: Ability to maintain clinical measurements within normal limits will improve ?Outcome: Progressing ?  ?Problem: Clinical Measurements: ?Goal: Cardiovascular complication will be avoided ?Outcome: Progressing ?  ?Problem: Cardiovascular: ?Goal: Ability to achieve and maintain adequate cardiovascular perfusion will improve ?Outcome: Progressing ?Goal: Vascular access site(s) Level 0-1 will be maintained ?Outcome: Progressing ?  ?Problem: Cardiovascular: ?Goal: Vascular access site(s) Level 0-1 will be maintained ?Outcome: Progressing ?  ?

## 2021-04-03 NOTE — Progress Notes (Signed)
?  Transition of Care (TOC) Screening Note ? ? ?Patient Details  ?Name: Samantha Osborne ?Date of Birth: [birth year] ? ? ?Transition of Care (TOC) CM/SW Contact:    ?Milas Gain, LCSWA ?Phone Number: ?04/03/2021, 5:35 PM ? ? ? ?Transition of Care Department [facility]) has reviewed patient and no TOC needs have been identified at this time. We will continue to monitor patient advancement through interdisciplinary progression rounds. If new patient transition needs arise, please place a TOC consult. ?  ?

## 2021-04-03 NOTE — H&P (View-Only) (Signed)
? ?Progress Note ? ?Patient Name: Samantha Osborne ?Date of Encounter: 04/03/2021 ? ?[facility] Cardiologist: Carlyle Dolly, MD  ? ?Subjective  ? ?Not currently having chest pain.  ? ?Inpatient Medications  ?  ?Scheduled Meds: ? amLODipine  2.5 mg Oral Daily  ? aspirin EC  81 mg Oral Daily  ? buPROPion  150 mg Oral Daily  ? enoxaparin (LOVENOX) injection  30 mg Subcutaneous Q24H  ? fluticasone furoate-vilanterol  1 puff Inhalation Daily  ? levothyroxine  150 mcg Oral Q0600  ? metoprolol tartrate  12.5 mg Oral BID  ? pantoprazole  40 mg Oral Daily  ? sodium chloride flush  3 mL Intravenous Q12H  ? traZODone  50 mg Oral QHS  ? valACYclovir  1,000 mg Oral Daily  ? ?Continuous Infusions: ? sodium chloride 166 mL/hr at 04/03/21 0425  ? sodium chloride    ? sodium chloride 1 mL/kg/hr (04/03/21 0529)  ? ?PRN Meds: ?sodium chloride, albuterol, hydrALAZINE, HYDROcodone-acetaminophen, Muscle Rub, nitroGLYCERIN, oxyCODONE, sodium chloride flush  ? ?Vital Signs  ?  ?Vitals:  ? 04/02/21 1444 04/02/21 2046 04/03/21 0016 04/03/21 0457  ?BP:  126/71 (!) 98/51 108/64  ?Pulse: 72 93 81 69  ?Resp:  '19 17 18  '$ ?Temp:  99.1 ?F (37.3 ?C) 97.9 ?F (36.6 ?C) 98.2 ?F (36.8 ?C)  ?TempSrc:  Oral Oral Oral  ?SpO2: 98% 96% 95% 95%  ?Weight:      ?Height:      ? ? ?Intake/Output Summary (Last 24 hours) at 04/03/2021 0718 ?Last data filed at 04/03/2021 0300 ?Gross per 24 hour  ?Intake 1025.28 ml  ?Output --  ?Net 1025.28 ml  ? ?Last 3 Weights 04/02/2021 04/01/2021 03/23/2021  ?Weight (lbs) 122 lb 8 oz 125 lb 128 lb  ?Weight (kg) 55.566 kg 56.7 kg 58.06 kg  ?   ? ?Telemetry  ?  ?Sinus in the 70s - Personally Reviewed ? ?ECG  ?  ?No new tracings - Personally Reviewed ? ?Physical Exam  ? ?GEN: No acute distress.   ?Neck: No JVD ?Cardiac: RRR, no murmurs, rubs, or gallops.  ?Respiratory: Clear to auscultation bilaterally. ?GI: Soft, nontender, non-distended  ?MS: No edema; No deformity. ?Neuro:  Nonfocal  ?Psych: Normal affect  ? ?Labs  ?  ?High  Sensitivity Troponin:   ?Recent Labs  ?Lab 04/01/21 ?1826 04/01/21 ?2032 04/02/21 ?9233 04/02/21 ?0532  ?TROPONINIHS 21* 26* 43* 51*  ?   ?Chemistry ?Recent Labs  ?Lab 03/30/21 ?0813 04/01/21 ?1826 04/02/21 ?0076  ?NA 137 136  --   ?K 4.8 4.7  --   ?CL 102 105  --   ?CO2 22 24  --   ?GLUCOSE 114* 118*  --   ?BUN 24 28*  --   ?CREATININE 1.57* 1.66*  --   ?CALCIUM 9.5 8.5*  --   ?MG  --   --  1.9  ?GFRNONAA  --  49*  --   ?ANIONGAP  --  7  --   ?  ?Lipids  ?Recent Labs  ?Lab 04/03/21 ?0132  ?CHOL 105  ?TRIG 126  ?HDL 33*  ?[place] 47  ?CHOLHDL 3.2  ?  ?Hematology ?Recent Labs  ?Lab 04/01/21 ?1826 04/03/21 ?0132  ?WBC 11.9* 11.6*  ?RBC 3.65* 3.70*  ?HGB 11.9* 11.9*  ?HCT 36.8 36.0  ?MCV 100.8* 97.3  ?MCH 32.6 32.2  ?MCHC 32.3 33.1  ?RDW 13.2 13.2  ?PLT 236 234  ? ?Thyroid No results for input(s): TSH, FREET4 in the last 168 hours.  ?BNPNo  results for input(s): BNP, PROBNP in the last 168 hours.  ?DDimer No results for input(s): DDIMER in the last 168 hours.  ? ?Radiology  ?  ?DG Chest 2 View ? ?Result Date: 04/01/2021 ?CLINICAL DATA:  Acute chest pain for 1 month. EXAM: CHEST - 2 VIEW COMPARISON:  04/29/2020 chest CT, 03/15/2015 radiograph and prior studies FINDINGS: The cardiomediastinal silhouette is unremarkable. Chronic interstitial opacities are noted. There is no evidence of focal airspace disease, pulmonary edema, suspicious pulmonary nodule/mass, pleural effusion, or pneumothorax. No acute bony abnormalities are identified. IMPRESSION: 1. No evidence of acute cardiopulmonary disease. 2. Chronic interstitial opacities. Electronically Signed   By: Margarette Canada M.D.   On: 04/01/2021 17:57   ? ?Cardiac Studies  ? ?Left heart cath pending ? ? ?Echo pending ? ? ?Carotid artery [place] 10/2020: ?Right Carotid: Velocities in the right ICA are consistent with a 40-59% stenosis.  ? ?Left Carotid: Velocities in the left ICA are consistent with a 60-79%  ?stenosis.  The ECA appears >50% stenosed.  ? ?Patient Profile  ?   ?66  y.o. female with a hx of chest pain (coronary calcification by prior CT, no ischemia by NST in 03/2015), palpitations (PAC's and brief NSVT by prior monitor), bicuspid aortic valve (mild stenosis by echo in 02/2018), carotid artery stenosis, HLD (intolerant to statins and on Repatha), Stage 3 CKD, history of DVT, tobacco use and family history of CAD (patient's mom had CABG in her 31's) who was seen 04/02/2021 for the evaluation of chest pain  ? ?Assessment & Plan  ?  ?Chest pain ?- concerning for angina ?- CE mildly elevated, trended to 51 ?- planned for definitive angiography today, but creatinine bumped ?- will allow fluids to run for 5 hrs and recheck BMP --> if baseline/trending down, may consider LHC today without LV gram ?- continue ASA, BB and repatha, ARB on hold ? ? ?Hyperlipidemia with LDL goal < 70 ?04/03/2021: Cholesterol 105; HDL 33; LDL Cholesterol 47; Triglycerides 126; VLDL 25 ?Intolerant to statins but doing well on repatha ? ? ?CKD stage III ?- sCr today trending up: 1.83 (1.66) ?- baseline seems to be 1.5-1.6 ?- pre-cath IVF started at 0530, will redraw BMP at 1100 ?- if sCr above 1.9, will defer heart cath to tomorrow ? ? ?Bicuspid aortic valve ?- on echo 02/2018 with mild AS ?- repeat echo pending ? ? ?Carotid artery stenosis ?- last study in 10/2020 with L > R nonobstructive disease ?- risk factor modification, no syncope ?   ? ?Anemia ?Hb stable at 11.9 ? ? ?For questions or updates, please contact [place] ?Please consult [email or web address] for contact info under  ? ? ?Signed, ?Ledora Bottcher, PA  ?04/03/2021, 7:18 AM   ? ?Patient seen, examined. Available data reviewed. Agree with findings, assessment, and plan as outlined by Doreene Adas, PA.  The patient is independently interviewed and examined.  She is alert, oriented, no distress.  Vital signs as above.  HEENT is normal, JVP is normal, carotid upstrokes are 2+ bilaterally, lungs are clear to auscultation bilaterally, heart is regular  rate and rhythm with a soft systolic ejection murmur best heard at the left lower sternal border, abdomen is soft and nontender, extremities have no edema, right radial pulses 2+. ? ?The patient presents with symptoms concerning for anginal chest pain.  Her troponin is mildly elevated.  Cardiac catheterization is indicated. I have reviewed the risks, indications, and alternatives to cardiac catheterization, possible angioplasty, and stenting with the  patient. Risks include but are not limited to bleeding, infection, vascular injury, stroke, myocardial infection, arrhythmia, kidney injury, radiation-related injury in the case of prolonged fluoroscopy use, emergency cardiac surgery, and death. The patient understands the risks of serious complication is 1-2 in 3704 with diagnostic cardiac cath and 1-2% or less with angioplasty/stenting.  Her creatinine is up slightly from baseline.  We will continue her IV fluids and repeat a creatinine a little bit later today.  I would anticipate moving forward with cardiac catheterization as long as her creatinine remained stable on recheck.  We will plan for a limited contrast study as able.  Other problems as outlined above I am in agreement. ? ?Sherren Mocha, M.D. ?04/03/2021 ?9:44 AM ? ? ?

## 2021-04-03 NOTE — Consult Note (Cosign Needed)
[address]       [place] [address]             [phone]        Samantha Osborne [facility] Medical Record #[id number] Date of Birth: 18-Sep-1955  Referring: Dr. 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 Patient describes chest burning and aching, pain in both sides of neck, and radiating to left arm. She sometimes has shortness of breath with episodes but denies nausea or LE edema. She has been having night sweats for a few months, which she does not think is related . Of note, this chest pain has been occurring for over one month and she ws scheduled for a coronary CT this week by Dr. Harl Bowie. EKG showed sinu rhythm, slight ST depression in Leads I and AVL. Troponin I (high sensitivity) initially was 21 and went up to 51.  She was transferred to [facility] for further evaluation and treatment.  Chest x ray showed chronic interstitial opacities. Echo showed LVEF 60-65%, no significant valvular abnormalities (trivial MR), and no pericardial effusion. Cardiac catheterization done today showed distal left main stenosis 90%, ostial Circumflex with a 75% stenosis, and mid to distal RCA 75% stenosis. A cardiothoracic consultation was requested with Dr. Prescott Gum for the consideration of coronary revascularization. At the time of my exam, patient is in no acute distress, BP is 129/76, HR 90, oxygenation is 100% on room air. She denies chest pain, pressure, or shortness of breath. Her husband  is at the bedside.  Current Activity/ Functional Status: Patient is independent with mobility/ambulation, transfers, ADL's, IADL's.   Zubrod Score: At the time of surgery this patients most appropriate activity status/level should be described as: '[]'$     0    Normal activity, no symptoms '[x]'$     1    Restricted in physical strenuous activity but ambulatory, able to do out light work '[]'$     2    Ambulatory and capable of self care, unable to do work activities, up and about more than 50%  Of the time                            '[]'$     3    Only limited self care, in bed greater than 50% of waking hours '[]'$     4    Completely disabled, no self care, confined to bed or chair '[]'$     5    Moribund  Past Medical History:  Diagnosis Date   Carotid artery occlusion-right 40-59% stenosis, left 60-79% stenosis    DDD (degenerative disc disease)    Depression    DVT (deep venous thrombosis) ([place]) 2008   right leg, per patient was on anticoagulation   Hypertension    Thyroid disease   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 They have 2 sons  Allergies: No Known Allergies  Current Facility-Administered Medications  Medication Dose Route Frequency Provider Last Rate Last Admin   0.9 %  sodium chloride infusion   Intravenous Continuous Jettie Booze, MD 55.6 mL/hr at 04/03/21 1308 Restarted at 04/03/21 1308   albuterol (PROVENTIL) (2.5 MG/3ML) 0.083%  nebulizer solution 2.5 mg  2.5 mg Nebulization Q6H PRN Jettie Booze, MD       amLODipine (NORVASC) tablet 2.5 mg  2.5 mg Oral Daily Larae Grooms S, MD   2.5 mg at 04/03/21 8657   aspirin EC tablet 81 mg  81 mg Oral Daily Jettie Booze, MD   81 mg at 04/02/21 1309   buPROPion (WELLBUTRIN SR) 12 hr tablet 150 mg  150 mg Oral Daily Jettie Booze, MD   150 mg at 04/03/21 0843   fluticasone furoate-vilanterol (BREO ELLIPTA) 200-25 MCG/ACT 1 puff  1 puff Inhalation Daily Jettie Booze, MD   1 puff at 04/03/21 0808   heparin ADULT infusion 100 units/mL (25000 units/272m)  1,100 Units/hr Intravenous Continuous Carney, Jessica C, RPH       hydrALAZINE (APRESOLINE) injection 10 mg  10 mg Intravenous Q6H PRN VJettie Booze MD       HYDROcodone-acetaminophen ([facility] 10-325 MG per tablet 1 tablet  1 tablet Oral Q4H PRN VJettie Booze MD   1 tablet at 04/03/21 1056   levothyroxine (SYNTHROID) tablet 150 mcg  150 mcg Oral Q0600 VJettie Booze MD   150 mcg at 04/03/21 0514   metoprolol tartrate (LOPRESSOR) tablet 12.5 mg  12.5 mg Oral BID VJettie Booze MD   12.5 mg at 04/03/21 08469  Muscle Rub CREA   Topical PRN VJettie Booze MD       nitroGLYCERIN (NITROSTAT) SL tablet 0.4 mg  0.4 mg Sublingual Q5 min PRN VJettie Booze MD       oxyCODONE (Oxy IR/ROXICODONE) immediate release tablet 15 mg  15 mg Oral Q6H PRN VJettie Booze MD   15 mg at 04/02/21 1443   pantoprazole (PROTONIX) EC tablet 40 mg  40 mg Oral Daily VJettie Booze MD   40 mg at 04/03/21 0838   sodium chloride flush (NS) 0.9 % injection 3 mL  3 mL Intravenous Q12H VJettie Booze MD   3 mL at 04/03/21 1004   traZODone (DESYREL) tablet 50 mg  50 mg Oral QHS VJettie Booze MD   50 mg at 04/02/21 2054   valACYclovir (VALTREX) tablet 1,000 mg  1,000 mg Oral Daily VJettie Booze MD   1,000 mg at 04/03/21 0840    Medications Prior to Admission   Medication Sig Dispense Refill Last Dose   albuterol (VENTOLIN HFA) 108 (90 Base) MCG/ACT inhaler Inhale 2 puffs into the lungs daily as needed for shortness of breath.   Past Week   amLODipine (NORVASC) 5 MG tablet Take 5 mg by mouth daily.   04/01/2021   aspirin EC 81 MG tablet  Take 81 mg by mouth daily.   04/01/2021   buPROPion (WELLBUTRIN SR) 150 MG 12 hr tablet Take 1 tablet by mouth daily.   04/01/2021   butalbital-acetaminophen-caffeine (FIORICET, ESGIC) 50-325-40 MG tablet Take 1 tablet by mouth every 4 (four) hours as needed.   unknown   celecoxib (CELEBREX) 100 MG capsule Take 100 mg by mouth 2 (two) times daily as needed.   unknown   Evolocumab with Infusor ([place]) 420 MG/3.5ML SOCT Inject 420 mg into the skin every 14 (fourteen) days.   unknown   fluticasone furoate-vilanterol (BREO ELLIPTA) 200-25 MCG/ACT AEPB Inhale 1 puff into the lungs daily. 60 each 2 04/01/2021   HYDROcodone-acetaminophen (NORCO) 10-325 MG per tablet Take 1 tablet by mouth every 4 (four) hours as needed for moderate pain. Reported on 07/05/2015   04/01/2021   levothyroxine (SYNTHROID) 150 MCG tablet Take 150 mcg by mouth daily.   04/01/2021   methocarbamol (ROBAXIN) 500 MG tablet Take 1 tablet (500 mg total) by mouth 4 (four) times daily. 28 tablet 0 unknown   metoprolol tartrate (LOPRESSOR) 25 MG tablet Take 25 mg by mouth 2 (two) times daily.   04/01/2021 at 01100   nitroGLYCERIN (NITROSTAT) 0.4 MG SL tablet Place 1 tablet (0.4 mg total) under the tongue every 5 (five) minutes as needed for chest pain. 25 tablet 3 04/01/2021   omeprazole (PRILOSEC) 40 MG capsule Take 1 capsule (40 mg total) by mouth daily. 30 capsule 5 04/01/2021   oxyCODONE (OXY IR/ROXICODONE) 5 MG immediate release tablet Take 15 mg by mouth every 4 (four) hours as needed for severe pain.   04/01/2021   valACYclovir (VALTREX) 1000 MG tablet Take 1,000 mg by mouth daily.   04/01/2021   valsartan (DIOVAN) 160 MG tablet Take 160 mg by  mouth daily.   04/01/2021   metoprolol tartrate (LOPRESSOR) 100 MG tablet Take 1 tablet (100 mg total) by mouth once for 1 dose. (Patient not taking: Reported on 04/01/2021) 1 tablet 0 Not Taking    Family History  Problem Relation Age of Onset   Heart disease Mother    Hyperlipidemia Mother    Heart attack Mother    AAA (abdominal aortic aneurysm) Mother    Cancer Father    Heart disease Brother    Hypertension Brother    Hypertension Son    Review of Systems:     Cardiac Review of Systems: Y or  [   N ]= no  Chest Pain [  Y-upon admission  ]   Exertional SOB  [ Y ]  Pedal Edema [  N ]    Syncope  [ N ]   Presyncope [ N  ]  General Review of Systems: [Y] = yes [ N ]=no Constitional: fatigue [  Y]; nausea [  N]; fever [  N]; or chills [  N]                                                                Eye :  Amaurosis fugax[N  ]; Resp: cough [  Y];  wheezing[  N];  hemoptysis[ N ];  GI: vomiting[ N ];  melena[N  ];  hematochezia [ N ];  GU: kidney stones [  ]; hematuria[  N];  Skin: rash, swelling[  N];,   Heme/Lymph: anemia[ N ];  Neuro: TIA[ N ];  headaches[  ];  stroke[ N ]; seizures[ N ];     Endocrine: diabetes[  N];  thyroid dysfunction[ Y ];               Physical Exam: BP 126/75 (BP Location: Left Arm)    Pulse 79    Temp 98.7 F (37.1 C) (Oral)    Resp 18    Ht '5\' 3"'$  (1.6 m)    Wt 55.6 kg    SpO2 98%    BMI 21.70 kg/m    General appearance: alert, cooperative, and no distress Head: Normocephalic, without obvious abnormality, atraumatic Neck: no carotid bruit, no JVD, and supple, symmetrical, trachea midline Resp: clear to auscultation bilaterally Cardio: RRR, no murmur GI: Soft, non tender, bowel sounds present Extremities: Palpable DP bilaterally. Very superficial veins (Saphenous included). No LE edema Neurologic: Grossly normal  Diagnostic Studies & Laboratory data:     Recent Radiology Findings:   DG Chest 2 View  Result Date:  04/01/2021 CLINICAL DATA:  Acute chest pain for 1 month. EXAM: CHEST - 2 VIEW COMPARISON:  04/29/2020 chest CT, 03/15/2015 radiograph and prior studies FINDINGS: The cardiomediastinal silhouette is unremarkable. Chronic interstitial opacities are noted. There is no evidence of focal airspace disease, pulmonary edema, suspicious pulmonary nodule/mass, pleural effusion, or pneumothorax. No acute bony abnormalities are identified. IMPRESSION: 1. No evidence of acute cardiopulmonary disease. 2. Chronic interstitial opacities. Electronically Signed   By: Margarette Canada M.D.   On: 04/01/2021 17:57   CARDIAC CATHETERIZATION  Result Date: 04/03/2021   Dist LM lesion is 90% stenosed.   Mid RCA to Dist RCA lesion is 75% stenosed.   Prox RCA to Mid RCA lesion is 50% stenosed.   Ost Cx lesion is 75% stenosed.  Circumflex is small.  RIght to left collaterals to the distal circumflex.   Prox LAD to Mid LAD lesion is 25% stenosed.   LV end diastolic pressure is normal.   There is no aortic valve stenosis. Severe three vessel coroanry artery disease.  Cardiac surgery consult. Would restart heparin IV 8 hours post sheath pull.     Coronary Diagrams  Diagnostic Dominance: Right Intervention  ECHOCARDIOGRAM COMPLETE  Result Date: 04/03/2021    ECHOCARDIOGRAM REPORT   Patient Name:   Samantha Osborne Date of Exam: 04/03/2021 Medical Rec #:  [id number]     Height:       63.0 in Accession #:    [id number]    Weight:       122.5 lb Date of Birth:  06-Jul-1955     BSA:          1.570 m Patient Age:    63 years      BP:           108/64 mmHg Patient Gender: F             HR:           75 bpm. Exam Location:  Inpatient Procedure: 2D Echo, Cardiac Doppler, Color Doppler, Strain Analysis and 3D Echo Indications:    CP AV disorder  History:        Patient has prior history of Echocardiogram examinations, most                 recent 03/17/2018.  Sonographer:    [place] Referring Phys: [id number] [place]  1.  Left  ventricular ejection fraction, by estimation, is 60 to 65%. The left ventricle has normal function. The left ventricle has no regional wall motion abnormalities. There is mild left ventricular hypertrophy of the basal-septal segment. Left ventricular diastolic parameters are consistent with Grade I diastolic dysfunction (impaired relaxation).  2. Right ventricular systolic function is normal. The right ventricular size is normal. There is normal pulmonary artery systolic pressure. The estimated right ventricular systolic pressure is 38.7 mmHg.  3. The mitral valve is normal in structure. Trivial mitral valve regurgitation. No evidence of mitral stenosis. Moderate mitral annular calcification.  4. The aortic valve is tricuspid. Aortic valve regurgitation is not visualized. Mild aortic valve stenosis. Aortic valve area, by VTI measures 1.45 cm. Aortic valve mean gradient measures 4.5 mmHg. Aortic valve Vmax measures 1.42 m/s. Despite a low mean AVG, the SVI is low (<34) and AVA is calculated at 1.45cm2. Suspect paradoxical low flow low gradient mild AS.  5. The inferior vena cava is normal in size with greater than 50% respiratory variability, suggesting right atrial pressure of 3 mmHg. FINDINGS  Left Ventricle: Left ventricular ejection fraction, by estimation, is 60 to 65%. The left ventricle has normal function. The left ventricle has no regional wall motion abnormalities. Global longitudinal strain performed but not reported based on interpreter judgement due to suboptimal tracking. The left ventricular internal cavity size was normal in size. There is mild left ventricular hypertrophy of the basal-septal segment. Left ventricular diastolic parameters are consistent with Grade I diastolic dysfunction (impaired relaxation). Normal left ventricular filling pressure. Right Ventricle: The right ventricular size is normal. No increase in right ventricular wall thickness. Right ventricular systolic function is  normal. There is normal pulmonary artery systolic pressure. The tricuspid regurgitant velocity is 2.37 m/s, and  with an assumed right atrial pressure of 3 mmHg, the estimated right ventricular systolic pressure is 56.4 mmHg. Left Atrium: Left atrial size was normal in size. Right Atrium: Right atrial size was normal in size. Pericardium: There is no evidence of pericardial effusion. Mitral Valve: The mitral valve is normal in structure. Moderate mitral annular calcification. Trivial mitral valve regurgitation. No evidence of mitral valve stenosis. MV peak gradient, 4.9 mmHg. The mean mitral valve gradient is 2.0 mmHg. Tricuspid Valve: The tricuspid valve is normal in structure. Tricuspid valve regurgitation is trivial. No evidence of tricuspid stenosis. Aortic Valve: The aortic valve is tricuspid. Aortic valve regurgitation is not visualized. Mild aortic stenosis is present. Aortic valve mean gradient measures 4.5 mmHg. Aortic valve peak gradient measures 8.1 mmHg. Aortic valve area, by VTI measures 1.45 cm. Pulmonic Valve: The pulmonic valve was normal in structure. Pulmonic valve regurgitation is trivial. No evidence of pulmonic stenosis. Aorta: The aortic root is normal in size and structure. Venous: The inferior vena cava is normal in size with greater than 50% respiratory variability, suggesting right atrial pressure of 3 mmHg. IAS/Shunts: No atrial level shunt detected by color flow Doppler.  LEFT VENTRICLE PLAX 2D LVIDd:         4.50 cm     Diastology LVIDs:         4.10 cm     LV e' medial:    4.37 cm/s LV PW:         0.90 cm     LV E/e' medial:  14.5 LV IVS:        1.20 cm     LV e' lateral:   6.06 cm/s LVOT diam:     1.90 cm  LV E/e' lateral: 10.5 LV SV:         39 LV SV Index:   25          2D Longitudinal Strain LVOT Area:     2.84 cm    2D Strain GLS (A2C):   -18.2 %                            2D Strain GLS (A3C):   -11.9 %                            2D Strain GLS (A4C):   -12.7 % LV Volumes (MOD)            2D Strain GLS Avg:     -14.3 % LV vol d, MOD A2C: 38.5 ml LV vol d, MOD A4C: 55.8 ml LV vol s, MOD A2C: 15.9 ml LV vol s, MOD A4C: 23.6 ml LV SV MOD A2C:     22.6 ml LV SV MOD A4C:     55.8 ml LV SV MOD BP:      27.4 ml RIGHT VENTRICLE RV Basal diam:  3.30 cm RV Mid diam:    2.20 cm TAPSE (M-mode): 1.6 cm LEFT ATRIUM             Index        RIGHT ATRIUM           Index LA Vol (A2C):   37.3 ml 23.76 ml/m  RA Area:     10.60 cm LA Vol (A4C):   22.3 ml 14.20 ml/m  RA Volume:   21.60 ml  13.76 ml/m LA Biplane Vol: 31.2 ml 19.87 ml/m  AORTIC VALVE                    PULMONIC VALVE AV Area (Vmax):    1.37 cm     PV Vmax:          0.87 m/s AV Area (Vmean):   1.46 cm     PV Vmean:         61.100 cm/s AV Area (VTI):     1.45 cm     PV VTI:           0.180 m AV Vmax:           142.50 cm/s  PV Peak grad:     3.0 mmHg AV Vmean:          94.400 cm/s  PV Mean grad:     2.0 mmHg AV VTI:            0.268 m      PR End Diast Vel: 5.48 msec AV Peak Grad:      8.1 mmHg AV Mean Grad:      4.5 mmHg LVOT Vmax:         68.90 cm/s LVOT Vmean:        48.500 cm/s LVOT VTI:          0.137 m LVOT/AV VTI ratio: 0.51  AORTA Ao Root diam: 2.50 cm Ao Asc diam:  3.30 cm MITRAL VALVE               TRICUSPID VALVE MV Area (PHT): 2.34 cm    TR Peak grad:   22.5 mmHg MV Area VTI:   1.28 cm    TR Vmax:  237.00 cm/s MV Peak grad:  4.9 mmHg MV Mean grad:  2.0 mmHg    SHUNTS MV Vmax:       1.11 m/s    Systemic VTI:  0.14 m MV Vmean:      62.9 cm/s   Systemic Diam: 1.90 cm MV Decel Time: 324 msec MV E velocity: 63.50 cm/s MV A velocity: 92.60 cm/s MV E/A ratio:  0.69 Fransico Him MD Electronically signed by Fransico Him MD Signature Date/Time: 04/03/2021/10:05:18 AM    Final      I have independently reviewed the above radiologic studies and discussed with the patient   Recent Lab Findings: Lab Results  Component Value Date   WBC 11.6 (H) 04/03/2021   HGB 11.9 (L) 04/03/2021   HCT 36.0 04/03/2021   PLT 234 04/03/2021    GLUCOSE 105 (H) 04/03/2021   CHOL 105 04/03/2021   TRIG 126 04/03/2021   HDL 33 (L) 04/03/2021   LDLCALC 47 04/03/2021   ALT 13 12/04/2019   AST 17 12/04/2019   NA 137 04/03/2021   K 4.4 04/03/2021   CL 107 04/03/2021   CREATININE 1.66 (H) 04/03/2021   BUN 21 04/03/2021   CO2 23 04/03/2021   TSH 0.520 03/15/2015    Assessment / Plan:   Coronary artery disease-family history as mother had CAD, CABG in her 110's. Would benefit from coronary artery bypass grafting surgery;continue pre op work up. Dr. Prescott Gum to provide his recommendation. History of hypertension-on Valsartan 160 mg daily and Lopressor 25 mg bid, and Amlodipine 5 mg daily prior to admission 3. CKD (stage III)-Creatinine this am decrease drom 1.83 to 1.66. Baseline as of 2021 appears to be 1.5 4. History of hyperlipidemia-statin intolerant and on Repatha 5. History of hypothyroidism-on Levothyroxine 150 mc daily 6. Carotid artery disease-duplex US October 2022 showed right internal carotid artery stenosis 40-59% and left internal carotid artery stenosis 60-79%. 7. History of tobacco abuse-encouraged cessation 8. 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 She has been seeing Dr. Vaughan Browner  I  spent 25 minutes counseling the patient face to face.   Lars Pinks PA-C 04/03/2021 3:53 PM

## 2021-04-04 ENCOUNTER — Other Ambulatory Visit (HOSPITAL_COMMUNITY): Payer: Medicare Other

## 2021-04-04 ENCOUNTER — Inpatient Hospital Stay (HOSPITAL_COMMUNITY): Payer: Medicare Other

## 2021-04-04 ENCOUNTER — Encounter (HOSPITAL_COMMUNITY): Payer: Self-pay | Admitting: Interventional Cardiology

## 2021-04-04 ENCOUNTER — Ambulatory Visit (HOSPITAL_COMMUNITY): Payer: Medicare Other

## 2021-04-04 DIAGNOSIS — I214 Non-ST elevation (NSTEMI) myocardial infarction: Secondary | ICD-10-CM

## 2021-04-04 DIAGNOSIS — Z01810 Encounter for preprocedural cardiovascular examination: Secondary | ICD-10-CM

## 2021-04-04 LAB — HEPATIC FUNCTION PANEL
ALT: 12 U/L (ref 0–44)
AST: 15 U/L (ref 15–41)
Albumin: 2.8 g/dL — ABNORMAL LOW (ref 3.5–5.0)
Alkaline Phosphatase: 65 U/L (ref 38–126)
Bilirubin, Direct: <0.1 mg/dL (ref 0.0–0.2)
Total Bilirubin: 0.2 mg/dL — ABNORMAL LOW (ref 0.3–1.2)
Total Protein: 6.5 g/dL (ref 6.5–8.1)

## 2021-04-04 LAB — BASIC METABOLIC PANEL
Anion gap: 6 (ref 5–15)
BUN: 24 mg/dL — ABNORMAL HIGH (ref 8–23)
CO2: 24 mmol/L (ref 22–32)
Calcium: 8.4 mg/dL — ABNORMAL LOW (ref 8.9–10.3)
Chloride: 107 mmol/L (ref 98–111)
Creatinine, Ser: 1.57 mg/dL — ABNORMAL HIGH (ref 0.44–1.00)
GFR, Estimated: 36 mL/min — ABNORMAL LOW (ref >60)
Glucose, Bld: 109 mg/dL — ABNORMAL HIGH (ref 70–99)
Potassium: 4.2 mmol/L (ref 3.5–5.1)
Sodium: 137 mmol/L (ref 135–145)

## 2021-04-04 LAB — CBC
HCT: 34.4 % — ABNORMAL LOW (ref 36.0–46.0)
Hemoglobin: 11.4 g/dL — ABNORMAL LOW (ref 12.0–15.0)
MCH: 32.3 pg (ref 26.0–34.0)
MCHC: 33.1 g/dL (ref 30.0–36.0)
MCV: 97.5 fL (ref 80.0–100.0)
Platelets: 215 10*3/uL (ref 150–400)
RBC: 3.53 MIL/uL — ABNORMAL LOW (ref 3.87–5.11)
RDW: 13.2 % (ref 11.5–15.5)
WBC: 11.2 10*3/uL — ABNORMAL HIGH (ref 4.0–10.5)
nRBC: 0 % (ref 0.0–0.2)

## 2021-04-04 LAB — HEMOGLOBIN A1C
Hgb A1c MFr Bld: 6 % — ABNORMAL HIGH (ref 4.8–5.6)
Mean Plasma Glucose: 126 mg/dL

## 2021-04-04 LAB — PREPARE RBC (CROSSMATCH)

## 2021-04-04 LAB — PROTIME-INR
INR: 1 (ref 0.8–1.2)
Prothrombin Time: 13.6 seconds (ref 11.4–15.2)

## 2021-04-04 LAB — HEPARIN LEVEL (UNFRACTIONATED): Heparin Unfractionated: 0.46 IU/mL (ref 0.30–0.70)

## 2021-04-04 LAB — CORTISOL-AM, BLOOD: Cortisol - AM: 10.1 ug/dL (ref 6.7–22.6)

## 2021-04-04 LAB — TSH: TSH: 0.822 u[IU]/mL (ref 0.350–4.500)

## 2021-04-04 LAB — APTT: aPTT: 102 seconds — ABNORMAL HIGH (ref 24–36)

## 2021-04-04 MED ORDER — MAGNESIUM SULFATE 50 % IJ SOLN
40.0000 meq | INTRAMUSCULAR | Status: DC
  Filled 2021-04-04: qty 9.85

## 2021-04-04 MED ORDER — NOREPINEPHRINE 4 MG/250ML-% IV SOLN
0.0000 ug/min | INTRAVENOUS | Status: DC
  Filled 2021-04-04: qty 250

## 2021-04-04 MED ORDER — CEFAZOLIN SODIUM-DEXTROSE 2-4 GM/100ML-% IV SOLN
2.0000 g | INTRAVENOUS | Status: AC
  Administered 2021-04-05 (×2): 2 g via INTRAVENOUS
  Filled 2021-04-04: qty 100

## 2021-04-04 MED ORDER — INSULIN REGULAR(HUMAN) IN NACL 100-0.9 UT/100ML-% IV SOLN
INTRAVENOUS | Status: AC
  Administered 2021-04-05: 1.1 [IU]/h via INTRAVENOUS
  Filled 2021-04-04: qty 100

## 2021-04-04 MED ORDER — TRANEXAMIC ACID (OHS) PUMP PRIME SOLUTION
2.0000 mg/kg | INTRAVENOUS | Status: DC
  Filled 2021-04-04: qty 1.11

## 2021-04-04 MED ORDER — CEFAZOLIN SODIUM-DEXTROSE 2-4 GM/100ML-% IV SOLN
2.0000 g | INTRAVENOUS | Status: DC
  Filled 2021-04-04: qty 100

## 2021-04-04 MED ORDER — BISACODYL 5 MG PO TBEC: 5.0000 mg | DELAYED_RELEASE_TABLET | Freq: Once | ORAL | Status: DC

## 2021-04-04 MED ORDER — HEPARIN 30,000 UNITS/1000 ML (OHS) CELLSAVER SOLUTION
Status: DC
  Filled 2021-04-04: qty 1000

## 2021-04-04 MED ORDER — METOPROLOL TARTRATE 12.5 MG HALF TABLET
12.5000 mg | ORAL_TABLET | Freq: Once | ORAL | Status: AC
  Administered 2021-04-05: 12.5 mg via ORAL
  Filled 2021-04-04: qty 1

## 2021-04-04 MED ORDER — VANCOMYCIN HCL 1250 MG/250ML IV SOLN
1250.0000 mg | INTRAVENOUS | Status: AC
  Administered 2021-04-05: 1250 mg via INTRAVENOUS
  Filled 2021-04-04: qty 250

## 2021-04-04 MED ORDER — TRANEXAMIC ACID (OHS) BOLUS VIA INFUSION
15.0000 mg/kg | INTRAVENOUS | Status: AC
Start: 2021-04-05 — End: 2021-04-05
  Administered 2021-04-05: 834 mg via INTRAVENOUS
  Filled 2021-04-04: qty 834

## 2021-04-04 MED ORDER — CHLORHEXIDINE GLUCONATE 0.12 % MT SOLN
15.0000 mL | Freq: Once | OROMUCOSAL | Status: AC
  Administered 2021-04-05: 15 mL via OROMUCOSAL
  Filled 2021-04-04: qty 15

## 2021-04-04 MED ORDER — DEXMEDETOMIDINE HCL IN NACL 400 MCG/100ML IV SOLN
0.1000 ug/kg/h | INTRAVENOUS | Status: AC
  Administered 2021-04-05: .3 ug/kg/h via INTRAVENOUS
  Filled 2021-04-04: qty 100

## 2021-04-04 MED ORDER — TEMAZEPAM 15 MG PO CAPS: 15.0000 mg | ORAL_CAPSULE | Freq: Once | ORAL | Status: DC | PRN

## 2021-04-04 MED ORDER — PLASMA-LYTE A IV SOLN
INTRAVENOUS | Status: DC
  Filled 2021-04-04: qty 2.5

## 2021-04-04 MED ORDER — ALPRAZOLAM 0.25 MG PO TABS
0.2500 mg | ORAL_TABLET | ORAL | Status: DC | PRN
  Administered 2021-04-04: 0.5 mg via ORAL
  Filled 2021-04-04: qty 2

## 2021-04-04 MED ORDER — DIAZEPAM 2 MG PO TABS
2.0000 mg | ORAL_TABLET | Freq: Once | ORAL | Status: AC
  Administered 2021-04-05: 2 mg via ORAL
  Filled 2021-04-04: qty 1

## 2021-04-04 MED ORDER — EPINEPHRINE HCL 5 MG/250ML IV SOLN IN NS
0.0000 ug/min | INTRAVENOUS | Status: DC
  Filled 2021-04-04: qty 250

## 2021-04-04 MED ORDER — CHLORHEXIDINE GLUCONATE 4 % EX LIQD
60.0000 mL | Freq: Once | CUTANEOUS | Status: AC
  Administered 2021-04-05: 4 via TOPICAL
  Filled 2021-04-04 (×2): qty 60

## 2021-04-04 MED ORDER — CHLORHEXIDINE GLUCONATE 4 % EX LIQD
60.0000 mL | Freq: Once | CUTANEOUS | Status: AC
  Administered 2021-04-04: 4 via TOPICAL
  Filled 2021-04-04: qty 60

## 2021-04-04 MED ORDER — TRANEXAMIC ACID 1000 MG/10ML IV SOLN
1.5000 mg/kg/h | INTRAVENOUS | Status: AC
  Administered 2021-04-05: 1.5 mg/kg/h via INTRAVENOUS
  Filled 2021-04-04: qty 25

## 2021-04-04 MED ORDER — PHENYLEPHRINE HCL-NACL 20-0.9 MG/250ML-% IV SOLN
30.0000 ug/min | INTRAVENOUS | Status: AC
  Administered 2021-04-05: 10 ug/min via INTRAVENOUS
  Filled 2021-04-04: qty 250

## 2021-04-04 MED ORDER — NITROGLYCERIN IN D5W 200-5 MCG/ML-% IV SOLN
2.0000 ug/min | INTRAVENOUS | Status: AC
  Administered 2021-04-05: 5 ug/min via INTRAVENOUS
  Filled 2021-04-04: qty 250

## 2021-04-04 MED ORDER — POTASSIUM CHLORIDE 2 MEQ/ML IV SOLN
80.0000 meq | INTRAVENOUS | Status: DC
Start: 2021-04-05 — End: 2021-04-05
  Filled 2021-04-04: qty 40

## 2021-04-04 MED ORDER — MILRINONE LACTATE IN DEXTROSE 20-5 MG/100ML-% IV SOLN
0.3000 ug/kg/min | INTRAVENOUS | Status: AC
  Administered 2021-04-05: .25 ug/kg/min via INTRAVENOUS
  Filled 2021-04-04: qty 100

## 2021-04-04 MED ORDER — NITROGLYCERIN IN D5W 200-5 MCG/ML-% IV SOLN
0.0000 ug/min | INTRAVENOUS | Status: DC
  Administered 2021-04-04: 5 ug/min via INTRAVENOUS
  Filled 2021-04-04: qty 250

## 2021-04-04 NOTE — Progress Notes (Signed)
1 Day Post-Op Procedure(s) (LRB): ?LEFT HEART CATH AND CORONARY ANGIOGRAPHY (N/A) ?Subjective: ?Now on iv heparin and iv NTG with less chest pain ?Pre Cabg dopplers show stable asymptomatic moderate L carotid disease and mild R carotid disease ?PFTs with diffusion capacity of 55%  in 2021- repeat study not yet reported, ABG this am not performed. Recent CXR with mild COPD changes ? ?Objective: ?Vital signs in last 24 hours: ?Temp:  [98.1 ?F (36.7 ?C)-99.2 ?F (37.3 ?C)] 98.4 ?F (36.9 ?C) (03/14 1406) ?Pulse Rate:  [75-88] 80 (03/14 1254) ?Cardiac Rhythm: Normal sinus rhythm (03/14 0814) ?Resp:  [19-20] 20 (03/14 1406) ?BP: (100-140)/(58-78) 100/58 (03/14 1254) ?SpO2:  [95 %-99 %] 96 % (03/14 1254) ? ?Hemodynamic parameters for last 24 hours: ? nsr ? ?Intake/Output from previous day: ?03/13 0701 - 03/14 0700 ?In: 1798.4 [P.O.:360; I.V.:1438.4] ?Out: 300 [Urine:300] ?Intake/Output this shift: ?Total I/O ?In: -  ?Out: 900 [Urine:900] ? ?Lungs clear ?No hematoma at cath site ? ?Lab Results: ?Recent Labs  ?  04/03/21 ?0132 04/04/21 ?9528  ?WBC 11.6* 11.2*  ?HGB 11.9* 11.4*  ?HCT 36.0 34.4*  ?PLT 234 215  ? ?BMET:  ?Recent Labs  ?  04/03/21 ?1033 04/04/21 ?4132  ?NA 137 137  ?K 4.4 4.2  ?CL 107 107  ?CO2 23 24  ?GLUCOSE 105* 109*  ?BUN 21 24*  ?CREATININE 1.66* 1.57*  ?CALCIUM 9.0 8.4*  ?  ?PT/INR:  ?Recent Labs  ?  04/04/21 ?0801  ?LABPROT 13.6  ?INR 1.0  ? ?ABG ?No results found for: PHART, HCO3, TCO2, ACIDBASEDEF, O2SAT ?CBG (last 3)  ?No results for input(s): GLUCAP in the last 72 hours. ? ?Assessment/Plan: ?S/P Procedure(s) (LRB): ?LEFT HEART CATH AND CORONARY ANGIOGRAPHY (N/A) ?Acute coronary syndrome ?Hx active smoking, mild-mod COPD ?Plan CABG in am - procedure d/w patient ? LOS: 3 days  ? ? ?Dahlia Byes ?04/04/2021 ?  ?

## 2021-04-04 NOTE — Progress Notes (Signed)
CARDIAC REHAB PHASE I  ? ?Preop education completed with pt and spouse. Pt given IS, able to demonstrate ~2000. Pt educated on importance of IS use, ambulation, and sternal precautions post-op. Pt given in-the-tube sheet along with cardiac surgery booklet and [facility] care guide. Pt denies further questions or concerns at this time. Will continue to follow pt throughout her hospital stay. ? ? ?[phone] ?Rufina Falco, RN BSN ?04/04/2021 ?11:21 AM ? ?

## 2021-04-04 NOTE — Progress Notes (Signed)
Patient refused ABG this morning. Patient stated she already let RT attempt twice last night and it was unsuccessful.  ?

## 2021-04-04 NOTE — Care Management Important Message (Signed)
Important Message ? ?Patient Details  ?Name: Samantha Osborne ?MRN: [id number] ?Date of Birth: 06-05-1955 ? ? ?Medicare Important Message Given:  Yes ? ? ? ? ?Shelda Altes ?04/04/2021, 9:44 AM ?

## 2021-04-04 NOTE — Progress Notes (Signed)
Received report from the RT that patient refused ABG. Education given. Will inform the MD (CS) tomorrow. Rescheduled ABG tomorrow at 1000H. ?

## 2021-04-04 NOTE — Progress Notes (Signed)
Patient complained of chest pressure 5/10 after ambulating to the bathroom and relieved by rest. Patient said it started from the back going to chest. EKG done (No changes from previous EKG). Vital signs are stable. Refused Nitro SL. Will monitor. ?

## 2021-04-04 NOTE — Plan of Care (Signed)
?  Problem: Education: ?Goal: Knowledge of General Education information will improve ?Description: Including pain rating scale, medication(s)/side effects and non-pharmacologic comfort measures ?Outcome: Progressing ?  ?Problem: Clinical Measurements: ?Goal: Ability to maintain clinical measurements within normal limits will improve ?Outcome: Progressing ?  ?Problem: Clinical Measurements: ?Goal: Respiratory complications will improve ?Outcome: Progressing ?  ?Problem: Clinical Measurements: ?Goal: Cardiovascular complication will be avoided ?Outcome: Progressing ?  ?Problem: Activity: ?Goal: Risk for activity intolerance will decrease ?Outcome: Progressing ?  ?Problem: Coping: ?Goal: Level of anxiety will decrease ?Outcome: Progressing ?  ?Problem: Pain Managment: ?Goal: General experience of comfort will improve ?Outcome: Progressing ?  ?Problem: Safety: ?Goal: Ability to remain free from injury will improve ?Outcome: Progressing ?  ?Problem: Cardiovascular: ?Goal: Ability to achieve and maintain adequate cardiovascular perfusion will improve ?Outcome: Progressing ?  ?Problem: Cardiovascular: ?Goal: Vascular access site(s) Level 0-1 will be maintained ?Outcome: Progressing ?  ?

## 2021-04-04 NOTE — Progress Notes (Signed)
Pre-CABG Dopplers completed. ?Refer to "CV Proc" under chart review to view preliminary results. ? ?04/04/2021 10:08 AM ?Kelby Aline., MHA, RVT, RDCS, RDMS   ?

## 2021-04-04 NOTE — Progress Notes (Signed)
Pt c/o 10/10 CP after getting OOB to [facility]. BP 151/91. Ntg gtt titrated x2,(see MAR) currently infusing at 46mg. O2 applied via [place]..Marland KitchenPt currently CP pain free. BP 144/84. Pt given PRN Xanax for anxiety and Oxy IR '15mg'$  for c/o leg pain. Will continue to monitor. HJessie Foot RN  ?

## 2021-04-04 NOTE — Progress Notes (Signed)
?PROGRESS NOTE ? ? ? ?[name]  OXB:[id number] DOB: July 12, 1955 DOA: 04/01/2021 ?PCP: Redmond School, MD ? ? ?Brief Narrative:  ?Briefly patient admitted for atypical chest pain with high test probability, cardiology consulted at [place], tentatively planned catheterization tomorrow pending clinical course, patient's creatinine is likely around 1.5, given outpatient labs suspect CKD 3B. ? ?Assessment & Plan: ?  ?Principal Problem: ?  Exertional chest pain ?Active Problems: ?  Elevated troponin ?  GERD (gastroesophageal reflux disease) ?  ILD (interstitial lung disease) ([place]) ?  Rheumatoid arthritis ([place]) ?  Essential hypertension ?  Hyperlipidemia ? ? 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 tentative plan for CABG, three-vessel, 04/05/2021 ? ?CKD 3B -baseline around 1.5, follow closely given recent contrast ? ?Hypertension, essential -stable, continue home meds including amlodipine metoprolol ? ?Tobacco abuse, COPD and ILD history, stable -continue home inhalers, no acute exacerbation, no indication for steroids or supplemental oxygen above baseline ? ?Depression, anxiety-continue home Wellbutrin ? ?Anemia of chronic disease in the setting of above CKD-stable, follow repeat labs ? ?GERD -continue PPI ? ?Consultants:  ?Cardiology ? ?Procedures:  ?Heart cath 04/03/2021 ?CABG tentatively planned 04/05/2021 ? ?Antimicrobials:  ?None ? ?Objective: ?Vitals:  ? 04/03/21 1517 04/03/21 2007 04/04/21 0422 04/04/21 0713  ?BP: 126/75 129/70 140/78 134/69  ?Pulse: 79 88 75   ?Resp: 18  19   ?Temp: 98.7 ?F (37.1 ?C) 99.2 ?F (37.3 ?C) 98.2 ?F (36.8 ?C)   ?TempSrc: Oral Oral Oral   ?SpO2: 98% 96% 99%   ?Weight:      ?Height:      ? ? ?Intake/Output Summary (Last 24 hours) at 04/04/2021 0758 ?Last data filed at 04/04/2021 6834 ?Gross per 24 hour  ?Intake 1798.38 ml  ?Output 800 ml   ?Net 998.38 ml  ? ? ?Filed Weights  ? 04/01/21 1730 04/02/21 1043  ?Weight: 56.7 kg 55.6 kg  ? ?General:  Pleasantly resting in bed, No acute distress. ?HEENT:  Normocephalic atraumatic.  Sclerae nonicteric, noninjected.  Extraocular movements intact bilaterally. ?Neck:  Without mass or deformity.  Trachea is midline. ?Lungs:  Clear to auscultate bilaterally without rhonchi, wheeze, or rales. ?Heart:  Regular rate and rhythm.  Without murmurs, rubs, or gallops. ?Abdomen:  Soft, nontender, nondistended.  Without guarding or rebound. ?Extremities: Without cyanosis, clubbing, edema, or obvious deformity. ?Vascular:  Dorsalis pedis and posterior tibial pulses palpable bilaterally. ?Skin:  Warm and dry, no erythema, no ulcerations. ? ?Data Reviewed: I have personally reviewed following labs and imaging studies ? ?CBC: ?Recent Labs  ?Lab 04/01/21 ?1826 04/03/21 ?0132 04/04/21 ?1962  ?WBC 11.9* 11.6* 11.2*  ?HGB 11.9* 11.9* 11.4*  ?HCT 36.8 36.0 34.4*  ?MCV 100.8* 97.3 97.5  ?PLT 236 234 215  ? ? ?Basic Metabolic Panel: ?Recent Labs  ?Lab 03/30/21 ?0813 04/01/21 ?1826 04/02/21 ?2297 04/03/21 ?9892 04/03/21 ?1033 04/04/21 ?1194  ?NA 137 136  --  138 137 137  ?K 4.8 4.7  --  4.4 4.4 4.2  ?CL 102 105  --  107 107 107  ?CO2 22 24  --  '24 23 24  '$ ?GLUCOSE 114* 118*  --  105* 105* 109*  ?BUN 24 28*  --  22 21 24*  ?CREATININE 1.57* 1.66*  --  1.83* 1.66* 1.57*  ?CALCIUM 9.5 8.5*  --  8.7* 9.0 8.4*  ?MG  --   --  1.9  --   --   --   ?  PHOS  --   --  3.2  --   --   --   ? ? ?GFR: ?Estimated Creatinine Clearance: 28.8 mL/min (A) (by C-G formula based on SCr of 1.57 mg/dL (H)). ?Liver Function Tests: ?Recent Labs  ?Lab 04/04/21 ?4315  ?AST 15  ?ALT 12  ?ALKPHOS 65  ?BILITOT 0.2*  ?PROT 6.5  ?ALBUMIN 2.8*  ? ?No results for input(s): LIPASE, AMYLASE in the last 168 hours. ?No results for input(s): AMMONIA in the last 168 hours. ?Coagulation Profile: ?No results for input(s): INR, PROTIME in the last 168 hours. ?Cardiac Enzymes: ?No  results for input(s): CKTOTAL, CKMB, CKMBINDEX, TROPONINI in the last 168 hours. ?BNP (last 3 results) ?No results for input(s): PROBNP in the last 8760 hours. ?HbA1C: ?No results for input(s): HGBA1C in the last 72 hours. ?CBG: ?No results for input(s): GLUCAP in the last 168 hours. ?Lipid Profile: ?Recent Labs  ?  04/03/21 ?0132  ?CHOL 105  ?HDL 33*  ?[place] 47  ?TRIG 126  ?CHOLHDL 3.2  ? ? ?Thyroid Function Tests: ?Recent Labs  ?  04/04/21 ?4008  ?TSH 0.822  ? ?Anemia Panel: ?Recent Labs  ?  04/03/21 ?0132  ?VITAMINB12 176*  ?FOLATE 13.9  ?FERRITIN 65  ?TIBC 283  ?IRON 40  ? ? ?Sepsis Labs: ?No results for input(s): PROCALCITON, LATICACIDVEN in the last 168 hours. ? ?Recent Results (from the past 240 hour(s))  ?Resp Panel by RT-PCR (Flu A&B, Covid) Nasopharyngeal Swab     Status: None  ? Collection Time: 04/01/21 11:32 PM  ? Specimen: Nasopharyngeal Swab; Nasopharyngeal(NP) swabs in vial transport medium  ?Result Value Ref Range Status  ? SARS Coronavirus 2 by RT PCR NEGATIVE NEGATIVE Final  ?  Comment: (NOTE) ?SARS-CoV-2 target nucleic acids are NOT DETECTED. ? ?The SARS-CoV-2 RNA is generally detectable in upper respiratory ?specimens during the acute phase of infection. The lowest ?concentration of SARS-CoV-2 viral copies this assay can detect is ?138 copies/mL. A negative result does not preclude SARS-Cov-2 ?infection and should not be used Samantha the sole basis for treatment or ?other patient management decisions. A negative result may occur with  ?improper specimen collection/handling, submission of specimen other ?than nasopharyngeal swab, presence of viral mutation(s) within the ?areas targeted by this assay, and inadequate number of viral ?copies(<138 copies/mL). A negative result must be combined with ?clinical observations, patient history, and epidemiological ?information. The expected result is Negative. ? ?Fact Sheet for Patients:  ?EntrepreneurPulse.com.au ? ?Fact Sheet for Healthcare  Providers:  ?IncredibleEmployment.be ? ?This test is no t yet approved or cleared by the [place] FDA and  ?has been authorized for detection and/or diagnosis of SARS-CoV-2 by ?FDA under an Emergency Use Authorization (EUA). This EUA will remain  ?in effect (meaning this test can be used) for the duration of the ?COVID-19 declaration under Section 564(b)(1) of the Act, 21 ?U.S.C.section 360bbb-3(b)(1), unless the authorization is terminated  ?or revoked sooner.  ? ? ?  ? Influenza A by PCR NEGATIVE NEGATIVE Final  ? Influenza B by PCR NEGATIVE NEGATIVE Final  ?  Comment: (NOTE) ?The Xpert Xpress SARS-CoV-2/FLU/RSV plus assay is intended Samantha an aid ?in the diagnosis of influenza from Nasopharyngeal swab specimens and ?should not be used Samantha a sole basis for treatment. Nasal washings and ?aspirates are unacceptable for Xpert Xpress SARS-CoV-2/FLU/RSV ?testing. ? ?Fact Sheet for Patients: ?EntrepreneurPulse.com.au ? ?Fact Sheet for Healthcare Providers: ?IncredibleEmployment.be ? ?This test is not yet approved or cleared by the [place] and ?has  been authorized for detection and/or diagnosis of SARS-CoV-2 by ?FDA under an Emergency Use Authorization (EUA). This EUA will remain ?in effect (meaning this test can be used) for the duration of the ?COVID-19 declaration under Section 564(b)(1) of the Act, 21 U.S.C. ?section 360bbb-3(b)(1), unless the authorization is terminated or ?revoked. ? ?Performed at [facility], [address] [place], [place] [address] ?  ?Surgical pcr screen     Status: None  ? Collection Time: 04/03/21  7:21 PM  ? Specimen: Nasal Mucosa; Nasal Swab  ?Result Value Ref Range Status  ? MRSA, PCR NEGATIVE NEGATIVE Final  ? Staphylococcus aureus NEGATIVE NEGATIVE Final  ?  Comment: (NOTE) ?The Xpert SA Assay (FDA approved for NASAL specimens in patients 40 ?years of age and older), is one component of a comprehensive ?surveillance  program. It is not intended to diagnose infection nor to ?guide or monitor treatment. ?Performed at [facility], [place] [address], [place], [place] ?[address] ?  ? ?Radiology Studies: ?CARDIAC CATHETERIZATI

## 2021-04-04 NOTE — Progress Notes (Signed)
ANTICOAGULATION CONSULT NOTE - Initial Consult ? ?Pharmacy Consult for IV heparin ?Indication: CAD planning CABG 3/15 ? ?Allergies  ?Allergen Reactions  ? Pravastatin Other (See Comments)  ?  Statin intolerance  ? Zetia [Ezetimibe] Other (See Comments)  ? ? ?Patient Measurements: ?Height: '5\' 3"'$  (160 cm) ?Weight: 55.6 kg (122 lb 8 oz) ?IBW/kg (Calculated) : 52.4 ?Heparin Dosing Weight: 55.6 kg ? ?Vital Signs: ?Temp: 98.2 ?F (36.8 ?C) (03/14 0422) ?Temp Source: Oral (03/14 0422) ?BP: 134/69 (03/14 0713) ?Pulse Rate: 75 (03/14 0422) ? ?Labs: ?Recent Labs  ?  04/01/21 ?1826 04/01/21 ?2032 04/02/21 ?7654 04/02/21 ?0532 04/03/21 ?0132 04/03/21 ?6503 04/03/21 ?1033 04/04/21 ?5465 04/04/21 ?0801  ?HGB 11.9*  --   --   --  11.9*  --   --  11.4*  --   ?HCT 36.8  --   --   --  36.0  --   --  34.4*  --   ?PLT 236  --   --   --  234  --   --  215  --   ?APTT  --   --   --   --   --   --   --   --  102*  ?LABPROT  --   --   --   --   --   --   --   --  13.6  ?INR  --   --   --   --   --   --   --   --  1.0  ?HEPARINUNFRC  --   --   --   --   --   --   --   --  0.46  ?CREATININE 1.66*  --   --   --   --  1.83* 1.66* 1.57*  --   ?TROPONINIHS 21*   < > 43* 51*  --  50*  --   --   --   ? < > = values in this interval not displayed.  ? ? ? ?Estimated Creatinine Clearance: 28.8 mL/min (A) (by C-G formula based on SCr of 1.57 mg/dL (H)). ? ? ?Medical History: ?Past Medical History:  ?Diagnosis Date  ? Carotid artery occlusion   ? DDD (degenerative disc disease)   ? Depression   ? DVT (deep venous thrombosis) ([place]) 2008  ? right leg  ? Hypertension   ? Thyroid disease   ? ? ?Medications:  ?Infusions:  ? sodium chloride 50 mL/hr at 04/04/21 0600  ? heparin 1,100 Units/hr (04/04/21 0600)  ? nitroGLYCERIN    ? ? ?Assessment: ?66 yo female s/p cath 3/13 with multivessel dx, TCTS consult >planning CABG 3/15  Pharmacy asked to initiate anticoagulation with IV heparin 8 hrs after sheath removed.  Sheath out ~ 3 PM 3/13 ?Heparin drip 1100 uts/hr  Heparin level 0.4 at goal CBC stable and no bleeding noted  ? ?Goal of Therapy:  ?Heparin level 0.3-0.7 units/ml ?Monitor platelets by anticoagulation protocol: Yes ?  ?Plan:  ?Continue heparin drip rate of 1100 units/hr   ?Daily heparin level and CBC. ? ? ?Bonnita Nasuti Pharm.D. CPP, BCPS ?Clinical Pharmacist ?[phone] ?04/04/2021 8:51 AM  ? ? ?[facility] pharmacy phone numbers are listed on [email or web address] ? ? ?

## 2021-04-04 NOTE — Progress Notes (Addendum)
? ?Progress Note ? ?Patient Name: Samantha Osborne ?Date of Encounter: 04/04/2021 ? ?[facility] Cardiologist: Carlyle Dolly, MD  ? ?Subjective  ? ?Did have several episodes of chest pain with activity overnight. None while at rest.  ? ?Inpatient Medications  ?  ?Scheduled Meds: ? amLODipine  2.5 mg Oral Daily  ? aspirin EC  81 mg Oral Daily  ? buPROPion  150 mg Oral Daily  ? fluticasone furoate-vilanterol  1 puff Inhalation Daily  ? guaiFENesin  600 mg Oral BID  ? levothyroxine  150 mcg Oral Q0600  ? metoprolol tartrate  12.5 mg Oral BID  ? pantoprazole  40 mg Oral Daily  ? sodium chloride flush  3 mL Intravenous Q12H  ? traZODone  50 mg Oral QHS  ? valACYclovir  1,000 mg Oral Daily  ? ?Continuous Infusions: ? sodium chloride 50 mL/hr at 04/04/21 0600  ? heparin 1,100 Units/hr (04/04/21 0600)  ? ?PRN Meds: ?albuterol, hydrALAZINE, HYDROcodone-acetaminophen, Muscle Rub, nitroGLYCERIN, oxyCODONE  ? ?Vital Signs  ?  ?Vitals:  ? 04/03/21 2007 04/04/21 0422 04/04/21 6004 04/04/21 5997  ?BP: 129/70 140/78 134/69   ?Pulse: 88 75    ?Resp:  19    ?Temp: 99.2 ?F (37.3 ?C) 98.2 ?F (36.8 ?C)    ?TempSrc: Oral Oral    ?SpO2: 96% 99%  95%  ?Weight:      ?Height:      ? ? ?Intake/Output Summary (Last 24 hours) at 04/04/2021 0812 ?Last data filed at 04/04/2021 7414 ?Gross per 24 hour  ?Intake 1798.38 ml  ?Output 800 ml  ?Net 998.38 ml  ? ?Last 3 Weights 04/02/2021 04/01/2021 03/23/2021  ?Weight (lbs) 122 lb 8 oz 125 lb 128 lb  ?Weight (kg) 55.566 kg 56.7 kg 58.06 kg  ?   ? ?Telemetry  ?  ?SR - Personally Reviewed ? ?ECG  ?  ?SR, slight ST depression in lateral leads - Personally Reviewed ? ?Physical Exam  ? ?GEN: No acute distress.   ?Neck: No JVD ?Cardiac: RRR, no murmurs, rubs, or gallops.  ?Respiratory: Clear to auscultation bilaterally. ?GI: Soft, nontender, non-distended  ?MS: No edema; No deformity. ?Neuro:  Nonfocal  ?Psych: Normal affect  ? ?Labs  ?  ?High Sensitivity Troponin:   ?Recent Labs  ?Lab 04/01/21 ?1826  04/01/21 ?2032 04/02/21 ?2395 04/02/21 ?0532 04/03/21 ?3202  ?TROPONINIHS 21* 26* 43* 51* 50*  ?   ?Chemistry ?Recent Labs  ?Lab 04/02/21 ?3343 04/03/21 ?5686 04/03/21 ?1033 04/04/21 ?1683  ?NA  --  138 137 137  ?K  --  4.4 4.4 4.2  ?CL  --  107 107 107  ?CO2  --  '24 23 24  '$ ?GLUCOSE  --  105* 105* 109*  ?BUN  --  22 21 24*  ?CREATININE  --  1.83* 1.66* 1.57*  ?CALCIUM  --  8.7* 9.0 8.4*  ?MG 1.9  --   --   --   ?PROT  --   --   --  6.5  ?ALBUMIN  --   --   --  2.8*  ?AST  --   --   --  15  ?ALT  --   --   --  12  ?ALKPHOS  --   --   --  65  ?BILITOT  --   --   --  0.2*  ?GFRNONAA  --  30* 34* 36*  ?ANIONGAP  --  '7 7 6  '$ ?  ?Lipids  ?Recent Labs  ?Lab 04/03/21 ?0132  ?  CHOL 105  ?TRIG 126  ?HDL 33*  ?[place] 47  ?CHOLHDL 3.2  ?  ?Hematology ?Recent Labs  ?Lab 04/01/21 ?1826 04/03/21 ?0132 04/04/21 ?8527  ?WBC 11.9* 11.6* 11.2*  ?RBC 3.65* 3.70* 3.53*  ?HGB 11.9* 11.9* 11.4*  ?HCT 36.8 36.0 34.4*  ?MCV 100.8* 97.3 97.5  ?MCH 32.6 32.2 32.3  ?MCHC 32.3 33.1 33.1  ?RDW 13.2 13.2 13.2  ?PLT 236 234 215  ? ?Thyroid  ?Recent Labs  ?Lab 04/04/21 ?7824  ?TSH 0.822  ?  ?BNPNo results for input(s): BNP, PROBNP in the last 168 hours.  ?DDimer No results for input(s): DDIMER in the last 168 hours.  ? ?Radiology  ?  ?CARDIAC CATHETERIZATION ? ?Result Date: 04/03/2021 ?  Dist LM lesion is 90% stenosed.   Mid RCA to Dist RCA lesion is 75% stenosed.   Prox RCA to Mid RCA lesion is 50% stenosed.   Ost Cx lesion is 75% stenosed.  Circumflex is small.  RIght to left collaterals to the distal circumflex.   Prox LAD to Mid LAD lesion is 25% stenosed.   LV end diastolic pressure is normal.   There is no aortic valve stenosis. Severe three vessel coroanry artery disease.  Cardiac surgery consult. Would restart heparin IV 8 hours post sheath pull.  ? ?ECHOCARDIOGRAM COMPLETE ? ?Result Date: 04/03/2021 ?   ECHOCARDIOGRAM REPORT   Patient Name:   Samantha Osborne Date of Exam: 04/03/2021 Medical Rec #:  [id number]     Height:       63.0 in Accession  #:    [id number]    Weight:       122.5 lb Date of Birth:  06-05-1955     BSA:          1.570 m? Patient Age:    66 years      BP:           108/64 mmHg Patient Gender: F             HR:           75 bpm. Exam Location:  Inpatient Procedure: 2D Echo, Cardiac Doppler, Color Doppler, Strain Analysis and 3D Echo Indications:    CP AV disorder  History:        Patient has prior history of Echocardiogram examinations, most                 recent 03/17/2018.  Sonographer:    [place] Referring Phys: [id number] [place]  1. Left ventricular ejection fraction, by estimation, is 60 to 65%. The left ventricle has normal function. The left ventricle has no regional wall motion abnormalities. There is mild left ventricular hypertrophy of the basal-septal segment. Left ventricular diastolic parameters are consistent with Grade I diastolic dysfunction (impaired relaxation).  2. Right ventricular systolic function is normal. The right ventricular size is normal. There is normal pulmonary artery systolic pressure. The estimated right ventricular systolic pressure is 71.2 mmHg.  3. The mitral valve is normal in structure. Trivial mitral valve regurgitation. No evidence of mitral stenosis. Moderate mitral annular calcification.  4. The aortic valve is tricuspid. Aortic valve regurgitation is not visualized. Mild aortic valve stenosis. Aortic valve area, by VTI measures 1.45 cm?Marland Kitchen Aortic valve mean gradient measures 4.5 mmHg. Aortic valve Vmax measures 1.42 m/s. Despite a low mean AVG, the SVI is low (<34) and AVA is calculated at 1.45cm2. Suspect paradoxical low flow low gradient mild AS.  5. The inferior vena  cava is normal in size with greater than 50% respiratory variability, suggesting right atrial pressure of 3 mmHg. FINDINGS  Left Ventricle: Left ventricular ejection fraction, by estimation, is 60 to 65%. The left ventricle has normal function. The left ventricle has no regional wall motion  abnormalities. Global longitudinal strain performed but not reported based on interpreter judgement due to suboptimal tracking. The left ventricular internal cavity size was normal in size. There is mild left ventricular hypertrophy of the basal-septal segment. Left ventricular diastolic parameters are consistent with Grade I diastolic dysfunction (impaired relaxation). Normal left ventricular filling pressure. Right Ventricle: The right ventricular size is normal. No increase in right ventricular wall thickness. Right ventricular systolic function is normal. There is normal pulmonary artery systolic pressure. The tricuspid regurgitant velocity is 2.37 m/s, and  with an assumed right atrial pressure of 3 mmHg, the estimated right ventricular systolic pressure is 10.9 mmHg. Left Atrium: Left atrial size was normal in size. Right Atrium: Right atrial size was normal in size. Pericardium: There is no evidence of pericardial effusion. Mitral Valve: The mitral valve is normal in structure. Moderate mitral annular calcification. Trivial mitral valve regurgitation. No evidence of mitral valve stenosis. MV peak gradient, 4.9 mmHg. The mean mitral valve gradient is 2.0 mmHg. Tricuspid Valve: The tricuspid valve is normal in structure. Tricuspid valve regurgitation is trivial. No evidence of tricuspid stenosis. Aortic Valve: The aortic valve is tricuspid. Aortic valve regurgitation is not visualized. Mild aortic stenosis is present. Aortic valve mean gradient measures 4.5 mmHg. Aortic valve peak gradient measures 8.1 mmHg. Aortic valve area, by VTI measures 1.45 cm?. Pulmonic Valve: The pulmonic valve was normal in structure. Pulmonic valve regurgitation is trivial. No evidence of pulmonic stenosis. Aorta: The aortic root is normal in size and structure. Venous: The inferior vena cava is normal in size with greater than 50% respiratory variability, suggesting right atrial pressure of 3 mmHg. IAS/Shunts: No atrial level shunt  detected by color flow Doppler.  LEFT VENTRICLE PLAX 2D LVIDd:         4.50 cm     Diastology LVIDs:         4.10 cm     LV e' medial:    4.37 cm/s LV PW:         0.90 cm     LV E/e' medial:  14.5 LV IVS:        1.20 cm

## 2021-04-05 ENCOUNTER — Inpatient Hospital Stay (HOSPITAL_COMMUNITY): Payer: Medicare Other | Admitting: Anesthesiology

## 2021-04-05 ENCOUNTER — Other Ambulatory Visit: Payer: Self-pay

## 2021-04-05 ENCOUNTER — Inpatient Hospital Stay (HOSPITAL_COMMUNITY): Payer: Medicare Other

## 2021-04-05 ENCOUNTER — Encounter (HOSPITAL_COMMUNITY): Payer: Self-pay | Admitting: Internal Medicine

## 2021-04-05 ENCOUNTER — Inpatient Hospital Stay (HOSPITAL_COMMUNITY)
Admission: EM | Disposition: A | Discharge status: Home / Self Care | Payer: Self-pay | Source: Home / Self Care | Attending: Cardiothoracic Surgery

## 2021-04-05 DIAGNOSIS — N289 Disorder of kidney and ureter, unspecified: Secondary | ICD-10-CM

## 2021-04-05 DIAGNOSIS — M1990 Unspecified osteoarthritis, unspecified site: Secondary | ICD-10-CM

## 2021-04-05 DIAGNOSIS — Z951 Presence of aortocoronary bypass graft: Secondary | ICD-10-CM

## 2021-04-05 DIAGNOSIS — I2510 Atherosclerotic heart disease of native coronary artery without angina pectoris: Secondary | ICD-10-CM

## 2021-04-05 DIAGNOSIS — I10 Essential (primary) hypertension: Secondary | ICD-10-CM

## 2021-04-05 HISTORY — PX: CORONARY ARTERY BYPASS GRAFT: SHX141

## 2021-04-05 HISTORY — PX: TEE WITHOUT CARDIOVERSION: SHX5443

## 2021-04-05 LAB — POCT I-STAT 7, (LYTES, BLD GAS, ICA,H+H)
Acid-Base Excess: 0 mmol/L (ref 0.0–2.0)
Acid-base deficit: 1 mmol/L (ref 0.0–2.0)
Acid-base deficit: 1 mmol/L (ref 0.0–2.0)
Acid-base deficit: 2 mmol/L (ref 0.0–2.0)
Acid-base deficit: 3 mmol/L — ABNORMAL HIGH (ref 0.0–2.0)
Acid-base deficit: 3 mmol/L — ABNORMAL HIGH (ref 0.0–2.0)
Acid-base deficit: 4 mmol/L — ABNORMAL HIGH (ref 0.0–2.0)
Acid-base deficit: 5 mmol/L — ABNORMAL HIGH (ref 0.0–2.0)
Acid-base deficit: 5 mmol/L — ABNORMAL HIGH (ref 0.0–2.0)
Bicarbonate: 20.3 mmol/L (ref 20.0–28.0)
Bicarbonate: 21.6 mmol/L (ref 20.0–28.0)
Bicarbonate: 22.6 mmol/L (ref 20.0–28.0)
Bicarbonate: 22.6 mmol/L (ref 20.0–28.0)
Bicarbonate: 23.1 mmol/L (ref 20.0–28.0)
Bicarbonate: 23.3 mmol/L (ref 20.0–28.0)
Bicarbonate: 23.5 mmol/L (ref 20.0–28.0)
Bicarbonate: 23.8 mmol/L (ref 20.0–28.0)
Bicarbonate: 24 mmol/L (ref 20.0–28.0)
Calcium, Ion: 0.89 mmol/L — CL (ref 1.15–1.40)
Calcium, Ion: 0.96 mmol/L — ABNORMAL LOW (ref 1.15–1.40)
Calcium, Ion: 0.98 mmol/L — ABNORMAL LOW (ref 1.15–1.40)
Calcium, Ion: 1 mmol/L — ABNORMAL LOW (ref 1.15–1.40)
Calcium, Ion: 1.07 mmol/L — ABNORMAL LOW (ref 1.15–1.40)
Calcium, Ion: 1.08 mmol/L — ABNORMAL LOW (ref 1.15–1.40)
Calcium, Ion: 1.09 mmol/L — ABNORMAL LOW (ref 1.15–1.40)
Calcium, Ion: 1.1 mmol/L — ABNORMAL LOW (ref 1.15–1.40)
Calcium, Ion: 1.29 mmol/L (ref 1.15–1.40)
HCT: 20 % — ABNORMAL LOW (ref 36.0–46.0)
HCT: 25 % — ABNORMAL LOW (ref 36.0–46.0)
HCT: 27 % — ABNORMAL LOW (ref 36.0–46.0)
HCT: 27 % — ABNORMAL LOW (ref 36.0–46.0)
HCT: 29 % — ABNORMAL LOW (ref 36.0–46.0)
HCT: 31 % — ABNORMAL LOW (ref 36.0–46.0)
HCT: 32 % — ABNORMAL LOW (ref 36.0–46.0)
HCT: 33 % — ABNORMAL LOW (ref 36.0–46.0)
HCT: 34 % — ABNORMAL LOW (ref 36.0–46.0)
Hemoglobin: 10.5 g/dL — ABNORMAL LOW (ref 12.0–15.0)
Hemoglobin: 10.9 g/dL — ABNORMAL LOW (ref 12.0–15.0)
Hemoglobin: 11.2 g/dL — ABNORMAL LOW (ref 12.0–15.0)
Hemoglobin: 11.6 g/dL — ABNORMAL LOW (ref 12.0–15.0)
Hemoglobin: 6.8 g/dL — CL (ref 12.0–15.0)
Hemoglobin: 8.5 g/dL — ABNORMAL LOW (ref 12.0–15.0)
Hemoglobin: 9.2 g/dL — ABNORMAL LOW (ref 12.0–15.0)
Hemoglobin: 9.2 g/dL — ABNORMAL LOW (ref 12.0–15.0)
Hemoglobin: 9.9 g/dL — ABNORMAL LOW (ref 12.0–15.0)
O2 Saturation: 100 %
O2 Saturation: 100 %
O2 Saturation: 100 %
O2 Saturation: 100 %
O2 Saturation: 92 %
O2 Saturation: 96 %
O2 Saturation: 97 %
O2 Saturation: 99 %
O2 Saturation: 99 %
Patient temperature: 35.6
Patient temperature: 36
Patient temperature: 98
Patient temperature: 98
Potassium: 4 mmol/L (ref 3.5–5.1)
Potassium: 4.1 mmol/L (ref 3.5–5.1)
Potassium: 4.2 mmol/L (ref 3.5–5.1)
Potassium: 4.3 mmol/L (ref 3.5–5.1)
Potassium: 4.3 mmol/L (ref 3.5–5.1)
Potassium: 4.3 mmol/L (ref 3.5–5.1)
Potassium: 4.9 mmol/L (ref 3.5–5.1)
Potassium: 5.6 mmol/L — ABNORMAL HIGH (ref 3.5–5.1)
Potassium: 5.7 mmol/L — ABNORMAL HIGH (ref 3.5–5.1)
Sodium: 132 mmol/L — ABNORMAL LOW (ref 135–145)
Sodium: 134 mmol/L — ABNORMAL LOW (ref 135–145)
Sodium: 135 mmol/L (ref 135–145)
Sodium: 136 mmol/L (ref 135–145)
Sodium: 137 mmol/L (ref 135–145)
Sodium: 138 mmol/L (ref 135–145)
Sodium: 138 mmol/L (ref 135–145)
Sodium: 139 mmol/L (ref 135–145)
Sodium: 139 mmol/L (ref 135–145)
TCO2: 21 mmol/L — ABNORMAL LOW (ref 22–32)
TCO2: 23 mmol/L (ref 22–32)
TCO2: 24 mmol/L (ref 22–32)
TCO2: 24 mmol/L (ref 22–32)
TCO2: 24 mmol/L (ref 22–32)
TCO2: 25 mmol/L (ref 22–32)
TCO2: 25 mmol/L (ref 22–32)
TCO2: 25 mmol/L (ref 22–32)
TCO2: 25 mmol/L (ref 22–32)
pCO2 arterial: 34.3 mmHg (ref 32–48)
pCO2 arterial: 36.2 mmHg (ref 32–48)
pCO2 arterial: 37.6 mmHg (ref 32–48)
pCO2 arterial: 38.5 mmHg (ref 32–48)
pCO2 arterial: 41.9 mmHg (ref 32–48)
pCO2 arterial: 43.4 mmHg (ref 32–48)
pCO2 arterial: 43.8 mmHg (ref 32–48)
pCO2 arterial: 45.3 mmHg (ref 32–48)
pCO2 arterial: 51.9 mmHg — ABNORMAL HIGH (ref 32–48)
pH, Arterial: 7.24 — ABNORMAL LOW (ref 7.35–7.45)
pH, Arterial: 7.297 — ABNORMAL LOW (ref 7.35–7.45)
pH, Arterial: 7.315 — ABNORMAL LOW (ref 7.35–7.45)
pH, Arterial: 7.324 — ABNORMAL LOW (ref 7.35–7.45)
pH, Arterial: 7.353 (ref 7.35–7.45)
pH, Arterial: 7.381 (ref 7.35–7.45)
pH, Arterial: 7.402 (ref 7.35–7.45)
pH, Arterial: 7.403 (ref 7.35–7.45)
pH, Arterial: 7.426 (ref 7.35–7.45)
pO2, Arterial: 128 mmHg — ABNORMAL HIGH (ref 83–108)
pO2, Arterial: 167 mmHg — ABNORMAL HIGH (ref 83–108)
pO2, Arterial: 381 mmHg — ABNORMAL HIGH (ref 83–108)
pO2, Arterial: 415 mmHg — ABNORMAL HIGH (ref 83–108)
pO2, Arterial: 450 mmHg — ABNORMAL HIGH (ref 83–108)
pO2, Arterial: 459 mmHg — ABNORMAL HIGH (ref 83–108)
pO2, Arterial: 70 mmHg — ABNORMAL LOW (ref 83–108)
pO2, Arterial: 81 mmHg — ABNORMAL LOW (ref 83–108)
pO2, Arterial: 91 mmHg (ref 83–108)

## 2021-04-05 LAB — HEMOGLOBIN AND HEMATOCRIT, BLOOD
HCT: 25.9 % — ABNORMAL LOW (ref 36.0–46.0)
Hemoglobin: 9.2 g/dL — ABNORMAL LOW (ref 12.0–15.0)

## 2021-04-05 LAB — POCT I-STAT EG7
Acid-base deficit: 6 mmol/L — ABNORMAL HIGH (ref 0.0–2.0)
Bicarbonate: 19.5 mmol/L — ABNORMAL LOW (ref 20.0–28.0)
Calcium, Ion: 1.01 mmol/L — ABNORMAL LOW (ref 1.15–1.40)
HCT: 21 % — ABNORMAL LOW (ref 36.0–46.0)
Hemoglobin: 7.1 g/dL — ABNORMAL LOW (ref 12.0–15.0)
O2 Saturation: 69 %
Potassium: 4 mmol/L (ref 3.5–5.1)
Sodium: 139 mmol/L (ref 135–145)
TCO2: 21 mmol/L — ABNORMAL LOW (ref 22–32)
pCO2, Ven: 35.7 mmHg — ABNORMAL LOW (ref 44–60)
pH, Ven: 7.346 (ref 7.25–7.43)
pO2, Ven: 37 mmHg (ref 32–45)

## 2021-04-05 LAB — BASIC METABOLIC PANEL
Anion gap: 8 (ref 5–15)
BUN: 19 mg/dL (ref 8–23)
CO2: 25 mmol/L (ref 22–32)
Calcium: 9 mg/dL (ref 8.9–10.3)
Chloride: 106 mmol/L (ref 98–111)
Creatinine, Ser: 1.5 mg/dL — ABNORMAL HIGH (ref 0.44–1.00)
GFR, Estimated: 38 mL/min — ABNORMAL LOW (ref >60)
Glucose, Bld: 115 mg/dL — ABNORMAL HIGH (ref 70–99)
Potassium: 4.3 mmol/L (ref 3.5–5.1)
Sodium: 139 mmol/L (ref 135–145)

## 2021-04-05 LAB — POCT I-STAT, CHEM 8
BUN: 19 mg/dL (ref 8–23)
BUN: 17 mg/dL (ref 8–23)
BUN: 18 mg/dL (ref 8–23)
BUN: 16 mg/dL (ref 8–23)
BUN: 16 mg/dL (ref 8–23)
Calcium, Ion: 1.28 mmol/L (ref 1.15–1.40)
Calcium, Ion: 0.97 mmol/L — ABNORMAL LOW (ref 1.15–1.40)
Calcium, Ion: 1.25 mmol/L (ref 1.15–1.40)
Calcium, Ion: 0.93 mmol/L — ABNORMAL LOW (ref 1.15–1.40)
Calcium, Ion: 1.01 mmol/L — ABNORMAL LOW (ref 1.15–1.40)
Chloride: 104 mmol/L (ref 98–111)
Chloride: 102 mmol/L (ref 98–111)
Chloride: 106 mmol/L (ref 98–111)
Chloride: 99 mmol/L (ref 98–111)
Chloride: 99 mmol/L (ref 98–111)
Creatinine, Ser: 1.3 mg/dL — ABNORMAL HIGH (ref 0.44–1.00)
Creatinine, Ser: 1.1 mg/dL — ABNORMAL HIGH (ref 0.44–1.00)
Creatinine, Ser: 1.4 mg/dL — ABNORMAL HIGH (ref 0.44–1.00)
Creatinine, Ser: 1 mg/dL (ref 0.44–1.00)
Creatinine, Ser: 1 mg/dL (ref 0.44–1.00)
Glucose, Bld: 117 mg/dL — ABNORMAL HIGH (ref 70–99)
Glucose, Bld: 109 mg/dL — ABNORMAL HIGH (ref 70–99)
Glucose, Bld: 116 mg/dL — ABNORMAL HIGH (ref 70–99)
Glucose, Bld: 156 mg/dL — ABNORMAL HIGH (ref 70–99)
Glucose, Bld: 186 mg/dL — ABNORMAL HIGH (ref 70–99)
HCT: 32 % — ABNORMAL LOW (ref 36.0–46.0)
HCT: 20 % — ABNORMAL LOW (ref 36.0–46.0)
HCT: 29 % — ABNORMAL LOW (ref 36.0–46.0)
HCT: 26 % — ABNORMAL LOW (ref 36.0–46.0)
HCT: 27 % — ABNORMAL LOW (ref 36.0–46.0)
Hemoglobin: 10.9 g/dL — ABNORMAL LOW (ref 12.0–15.0)
Hemoglobin: 6.8 g/dL — CL (ref 12.0–15.0)
Hemoglobin: 9.9 g/dL — ABNORMAL LOW (ref 12.0–15.0)
Hemoglobin: 8.8 g/dL — ABNORMAL LOW (ref 12.0–15.0)
Hemoglobin: 9.2 g/dL — ABNORMAL LOW (ref 12.0–15.0)
Potassium: 4.2 mmol/L (ref 3.5–5.1)
Potassium: 4.6 mmol/L (ref 3.5–5.1)
Potassium: 5.2 mmol/L — ABNORMAL HIGH (ref 3.5–5.1)
Potassium: 5.6 mmol/L — ABNORMAL HIGH (ref 3.5–5.1)
Potassium: 4.9 mmol/L (ref 3.5–5.1)
Sodium: 139 mmol/L (ref 135–145)
Sodium: 136 mmol/L (ref 135–145)
Sodium: 137 mmol/L (ref 135–145)
Sodium: 133 mmol/L — ABNORMAL LOW (ref 135–145)
Sodium: 135 mmol/L (ref 135–145)
TCO2: 27 mmol/L (ref 22–32)
TCO2: 25 mmol/L (ref 22–32)
TCO2: 26 mmol/L (ref 22–32)
TCO2: 25 mmol/L (ref 22–32)
TCO2: 24 mmol/L (ref 22–32)

## 2021-04-05 LAB — GLUCOSE, CAPILLARY
Glucose-Capillary: 130 mg/dL — ABNORMAL HIGH (ref 70–99)
Glucose-Capillary: 130 mg/dL — ABNORMAL HIGH (ref 70–99)
Glucose-Capillary: 137 mg/dL — ABNORMAL HIGH (ref 70–99)
Glucose-Capillary: 147 mg/dL — ABNORMAL HIGH (ref 70–99)
Glucose-Capillary: 158 mg/dL — ABNORMAL HIGH (ref 70–99)
Glucose-Capillary: 163 mg/dL — ABNORMAL HIGH (ref 70–99)
Glucose-Capillary: 180 mg/dL — ABNORMAL HIGH (ref 70–99)
Glucose-Capillary: 203 mg/dL — ABNORMAL HIGH (ref 70–99)

## 2021-04-05 LAB — CBC
HCT: 33.7 % — ABNORMAL LOW (ref 36.0–46.0)
HCT: 33.8 % — ABNORMAL LOW (ref 36.0–46.0)
Hemoglobin: 11.3 g/dL — ABNORMAL LOW (ref 12.0–15.0)
Hemoglobin: 11.1 g/dL — ABNORMAL LOW (ref 12.0–15.0)
MCH: 32.8 pg (ref 26.0–34.0)
MCH: 30.8 pg (ref 26.0–34.0)
MCHC: 33.5 g/dL (ref 30.0–36.0)
MCHC: 32.8 g/dL (ref 30.0–36.0)
MCV: 98 fL (ref 80.0–100.0)
MCV: 93.9 fL (ref 80.0–100.0)
Platelets: 245 10*3/uL (ref 150–400)
Platelets: 137 10*3/uL — ABNORMAL LOW (ref 150–400)
RBC: 3.44 MIL/uL — ABNORMAL LOW (ref 3.87–5.11)
RBC: 3.6 MIL/uL — ABNORMAL LOW (ref 3.87–5.11)
RDW: 13.1 % (ref 11.5–15.5)
RDW: 16.3 % — ABNORMAL HIGH (ref 11.5–15.5)
WBC: 12.3 10*3/uL — ABNORMAL HIGH (ref 4.0–10.5)
WBC: 29.3 10*3/uL — ABNORMAL HIGH (ref 4.0–10.5)
nRBC: 0 % (ref 0.0–0.2)
nRBC: 0 % (ref 0.0–0.2)

## 2021-04-05 LAB — PLATELET COUNT: Platelets: 133 10*3/uL — ABNORMAL LOW (ref 150–400)

## 2021-04-05 LAB — HEPARIN LEVEL (UNFRACTIONATED): Heparin Unfractionated: 0.76 IU/mL — ABNORMAL HIGH (ref 0.30–0.70)

## 2021-04-05 LAB — PROTIME-INR
INR: 1.4 — ABNORMAL HIGH (ref 0.8–1.2)
Prothrombin Time: 17.1 seconds — ABNORMAL HIGH (ref 11.4–15.2)

## 2021-04-05 LAB — PREPARE RBC (CROSSMATCH)

## 2021-04-05 LAB — APTT: aPTT: 33 seconds (ref 24–36)

## 2021-04-05 SURGERY — CORONARY ARTERY BYPASS GRAFTING (CABG)
Anesthesia: General | Site: Chest

## 2021-04-05 MED ORDER — DOCUSATE SODIUM 100 MG PO CAPS
200.0000 mg | ORAL_CAPSULE | Freq: Every day | ORAL | Status: DC
  Administered 2021-04-06 – 2021-04-09 (×4): 200 mg via ORAL
  Filled 2021-04-05 (×5): qty 2

## 2021-04-05 MED ORDER — CHLORHEXIDINE GLUCONATE 0.12 % MT SOLN
15.0000 mL | OROMUCOSAL | Status: AC
  Administered 2021-04-05: 15 mL via OROMUCOSAL

## 2021-04-05 MED ORDER — PLASMA-LYTE A IV SOLN
INTRAVENOUS | Status: DC | PRN
  Administered 2021-04-05: 500 mL via INTRAVASCULAR

## 2021-04-05 MED ORDER — ALBUMIN HUMAN 5 % IV SOLN: INTRAVENOUS | Status: DC | PRN

## 2021-04-05 MED ORDER — SODIUM CHLORIDE 0.9% FLUSH: 10.0000 mL | INTRAVENOUS | Status: DC | PRN

## 2021-04-05 MED ORDER — METOPROLOL TARTRATE 25 MG/10 ML ORAL SUSPENSION: 12.5000 mg | Freq: Two times a day (BID) | ORAL | Status: DC

## 2021-04-05 MED ORDER — ORAL CARE MOUTH RINSE
15.0000 mL | OROMUCOSAL | Status: DC
  Administered 2021-04-05 – 2021-04-06 (×6): 15 mL via OROMUCOSAL

## 2021-04-05 MED ORDER — POTASSIUM CHLORIDE 10 MEQ/50ML IV SOLN: 10.0000 meq | INTRAVENOUS | Status: AC

## 2021-04-05 MED ORDER — FENTANYL CITRATE (PF) 250 MCG/5ML IJ SOLN
INTRAMUSCULAR | Status: DC | PRN
  Administered 2021-04-05: 100 ug via INTRAVENOUS
  Administered 2021-04-05 (×3): 50 ug via INTRAVENOUS
  Administered 2021-04-05 (×2): 150 ug via INTRAVENOUS
  Administered 2021-04-05: 50 ug via INTRAVENOUS
  Administered 2021-04-05: 100 ug via INTRAVENOUS
  Administered 2021-04-05: 250 ug via INTRAVENOUS
  Administered 2021-04-05: 200 ug via INTRAVENOUS

## 2021-04-05 MED ORDER — SODIUM CHLORIDE 0.9 % IV SOLN: 250.0000 mL | INTRAVENOUS | Status: DC

## 2021-04-05 MED ORDER — INSULIN REGULAR(HUMAN) IN NACL 100-0.9 UT/100ML-% IV SOLN: INTRAVENOUS | Status: DC

## 2021-04-05 MED ORDER — ARTIFICIAL TEARS OPHTHALMIC OINT
TOPICAL_OINTMENT | OPHTHALMIC | Status: AC
  Filled 2021-04-05: qty 3.5

## 2021-04-05 MED ORDER — SODIUM CHLORIDE 0.9% FLUSH
3.0000 mL | Freq: Two times a day (BID) | INTRAVENOUS | Status: DC
  Administered 2021-04-06 – 2021-04-08 (×4): 3 mL via INTRAVENOUS

## 2021-04-05 MED ORDER — CHLORHEXIDINE GLUCONATE CLOTH 2 % EX PADS
6.0000 | MEDICATED_PAD | Freq: Every day | CUTANEOUS | Status: DC
  Administered 2021-04-05 – 2021-04-07 (×3): 6 via TOPICAL

## 2021-04-05 MED ORDER — FAMOTIDINE IN NACL 20-0.9 MG/50ML-% IV SOLN
20.0000 mg | Freq: Once | INTRAVENOUS | Status: AC
  Administered 2021-04-05: 20 mg via INTRAVENOUS

## 2021-04-05 MED ORDER — MIDAZOLAM HCL (PF) 5 MG/ML IJ SOLN
INTRAMUSCULAR | Status: DC | PRN
Start: 2021-04-05 — End: 2021-04-05
  Administered 2021-04-05: 1 mg via INTRAVENOUS
  Administered 2021-04-05: 2 mg via INTRAVENOUS
  Administered 2021-04-05 (×2): 1 mg via INTRAVENOUS
  Administered 2021-04-05: 3 mg via INTRAVENOUS
  Administered 2021-04-05: 2 mg via INTRAVENOUS

## 2021-04-05 MED ORDER — ASPIRIN 81 MG PO CHEW: 324.0000 mg | CHEWABLE_TABLET | Freq: Every day | ORAL | Status: DC

## 2021-04-05 MED ORDER — METOPROLOL TARTRATE 5 MG/5ML IV SOLN
2.5000 mg | INTRAVENOUS | Status: DC | PRN
  Administered 2021-04-07: 5 mg via INTRAVENOUS
  Filled 2021-04-05: qty 5

## 2021-04-05 MED ORDER — SODIUM CHLORIDE 0.9 % IV SOLN: INTRAVENOUS | Status: DC

## 2021-04-05 MED ORDER — VANCOMYCIN HCL IN DEXTROSE 1-5 GM/200ML-% IV SOLN
1000.0000 mg | Freq: Once | INTRAVENOUS | Status: AC
  Administered 2021-04-05: 1000 mg via INTRAVENOUS
  Filled 2021-04-05: qty 200

## 2021-04-05 MED ORDER — TRAMADOL HCL 50 MG PO TABS
50.0000 mg | ORAL_TABLET | ORAL | Status: DC | PRN
  Administered 2021-04-06: 100 mg via ORAL
  Filled 2021-04-05: qty 2

## 2021-04-05 MED ORDER — CEFAZOLIN SODIUM-DEXTROSE 2-4 GM/100ML-% IV SOLN
2.0000 g | Freq: Three times a day (TID) | INTRAVENOUS | Status: AC
Start: 2021-04-05 — End: 2021-04-07
  Administered 2021-04-05 – 2021-04-07 (×6): 2 g via INTRAVENOUS
  Filled 2021-04-05 (×6): qty 100

## 2021-04-05 MED ORDER — PANTOPRAZOLE SODIUM 40 MG PO TBEC
40.0000 mg | DELAYED_RELEASE_TABLET | Freq: Every day | ORAL | Status: DC
  Administered 2021-04-07 – 2021-04-10 (×4): 40 mg via ORAL
  Filled 2021-04-05 (×4): qty 1

## 2021-04-05 MED ORDER — HEMOSTATIC AGENTS (NO CHARGE) OPTIME
TOPICAL | Status: DC | PRN
  Administered 2021-04-05 (×3): 1 via TOPICAL

## 2021-04-05 MED ORDER — ALBUMIN HUMAN 5 % IV SOLN
250.0000 mL | INTRAVENOUS | Status: AC | PRN
  Administered 2021-04-05 (×2): 12.5 g via INTRAVENOUS

## 2021-04-05 MED ORDER — MILRINONE LACTATE IN DEXTROSE 20-5 MG/100ML-% IV SOLN
0.2500 ug/kg/min | INTRAVENOUS | Status: DC
  Filled 2021-04-05 (×2): qty 100

## 2021-04-05 MED ORDER — PROPOFOL 10 MG/ML IV BOLUS
INTRAVENOUS | Status: DC | PRN
  Administered 2021-04-05 (×2): 30 mg via INTRAVENOUS
  Administered 2021-04-05: 40 mg via INTRAVENOUS
  Administered 2021-04-05: 80 mg via INTRAVENOUS
  Administered 2021-04-05: 30 mg via INTRAVENOUS

## 2021-04-05 MED ORDER — PHENYLEPHRINE 40 MCG/ML (10ML) SYRINGE FOR IV PUSH (FOR BLOOD PRESSURE SUPPORT)
PREFILLED_SYRINGE | INTRAVENOUS | Status: AC
  Filled 2021-04-05: qty 10

## 2021-04-05 MED ORDER — LEVALBUTEROL HCL 1.25 MG/0.5ML IN NEBU
1.2500 mg | INHALATION_SOLUTION | Freq: Four times a day (QID) | RESPIRATORY_TRACT | Status: DC
  Administered 2021-04-05 – 2021-04-06 (×2): 1.25 mg via RESPIRATORY_TRACT
  Filled 2021-04-05 (×2): qty 0.5

## 2021-04-05 MED ORDER — LACTATED RINGERS IV SOLN: INTRAVENOUS | Status: DC

## 2021-04-05 MED ORDER — MAGNESIUM SULFATE 4 GM/100ML IV SOLN
4.0000 g | Freq: Once | INTRAVENOUS | Status: AC
  Administered 2021-04-05: 4 g via INTRAVENOUS
  Filled 2021-04-05: qty 100

## 2021-04-05 MED ORDER — AMIODARONE IV BOLUS ONLY 150 MG/100ML
150.0000 mg | Freq: Once | INTRAVENOUS | Status: DC
Start: 2021-04-05 — End: 2021-04-06

## 2021-04-05 MED ORDER — PHENYLEPHRINE 40 MCG/ML (10ML) SYRINGE FOR IV PUSH (FOR BLOOD PRESSURE SUPPORT)
PREFILLED_SYRINGE | INTRAVENOUS | Status: DC | PRN
  Administered 2021-04-05 (×3): 80 ug via INTRAVENOUS
  Administered 2021-04-05: 40 ug via INTRAVENOUS
  Administered 2021-04-05: 80 ug via INTRAVENOUS
  Administered 2021-04-05: 40 ug via INTRAVENOUS
  Administered 2021-04-05: 160 ug via INTRAVENOUS
  Administered 2021-04-05: 40 ug via INTRAVENOUS
  Administered 2021-04-05: 80 ug via INTRAVENOUS
  Administered 2021-04-05: 160 ug via INTRAVENOUS
  Administered 2021-04-05 (×3): 40 ug via INTRAVENOUS
  Administered 2021-04-05: 80 ug via INTRAVENOUS

## 2021-04-05 MED ORDER — MIDAZOLAM HCL (PF) 10 MG/2ML IJ SOLN
INTRAMUSCULAR | Status: AC
  Filled 2021-04-05: qty 2

## 2021-04-05 MED ORDER — FENTANYL CITRATE (PF) 250 MCG/5ML IJ SOLN
INTRAMUSCULAR | Status: AC
  Filled 2021-04-05: qty 5

## 2021-04-05 MED ORDER — LACTATED RINGERS IV SOLN: INTRAVENOUS | Status: DC | PRN

## 2021-04-05 MED ORDER — METOPROLOL TARTRATE 12.5 MG HALF TABLET
12.5000 mg | ORAL_TABLET | Freq: Two times a day (BID) | ORAL | Status: DC
  Administered 2021-04-06 – 2021-04-07 (×4): 12.5 mg via ORAL
  Filled 2021-04-05 (×4): qty 1

## 2021-04-05 MED ORDER — ACETAMINOPHEN 650 MG RE SUPP
650.0000 mg | Freq: Once | RECTAL | Status: AC
  Administered 2021-04-05: 650 mg via RECTAL

## 2021-04-05 MED ORDER — HEPARIN SODIUM (PORCINE) 1000 UNIT/ML IJ SOLN
INTRAMUSCULAR | Status: AC
  Filled 2021-04-05: qty 1

## 2021-04-05 MED ORDER — ACETAMINOPHEN 500 MG PO TABS
1000.0000 mg | ORAL_TABLET | Freq: Four times a day (QID) | ORAL | Status: DC
  Administered 2021-04-05 – 2021-04-09 (×10): 1000 mg via ORAL
  Filled 2021-04-05 (×11): qty 2

## 2021-04-05 MED ORDER — ACETAMINOPHEN 160 MG/5ML PO SOLN: 1000.0000 mg | Freq: Four times a day (QID) | ORAL | Status: DC

## 2021-04-05 MED ORDER — ACETAMINOPHEN 160 MG/5ML PO SOLN: 650.0000 mg | Freq: Once | ORAL | Status: AC

## 2021-04-05 MED ORDER — FAMOTIDINE IN NACL 20-0.9 MG/50ML-% IV SOLN
20.0000 mg | Freq: Two times a day (BID) | INTRAVENOUS | Status: AC
  Administered 2021-04-05: 20 mg via INTRAVENOUS
  Filled 2021-04-05 (×2): qty 50

## 2021-04-05 MED ORDER — LEVOTHYROXINE SODIUM 75 MCG PO TABS
150.0000 ug | ORAL_TABLET | Freq: Every day | ORAL | Status: DC
  Administered 2021-04-07 – 2021-04-10 (×4): 150 ug via ORAL
  Filled 2021-04-05: qty 2
  Filled 2021-04-05: qty 1
  Filled 2021-04-05: qty 2
  Filled 2021-04-05 (×2): qty 1

## 2021-04-05 MED ORDER — BISACODYL 10 MG RE SUPP
10.0000 mg | Freq: Every day | RECTAL | Status: DC
  Filled 2021-04-05: qty 1

## 2021-04-05 MED ORDER — PHENYLEPHRINE HCL-NACL 20-0.9 MG/250ML-% IV SOLN
0.0000 ug/min | INTRAVENOUS | Status: DC
  Filled 2021-04-05: qty 250

## 2021-04-05 MED ORDER — DEXMEDETOMIDINE HCL IN NACL 400 MCG/100ML IV SOLN
0.0000 ug/kg/h | INTRAVENOUS | Status: DC
  Filled 2021-04-05: qty 100

## 2021-04-05 MED ORDER — MIDAZOLAM HCL 2 MG/2ML IJ SOLN
2.0000 mg | INTRAMUSCULAR | Status: DC | PRN
  Administered 2021-04-05: 2 mg via INTRAVENOUS
  Filled 2021-04-05: qty 2

## 2021-04-05 MED ORDER — OXYCODONE HCL 5 MG PO TABS
5.0000 mg | ORAL_TABLET | ORAL | Status: DC | PRN
  Administered 2021-04-05: 10 mg via ORAL
  Filled 2021-04-05: qty 2

## 2021-04-05 MED ORDER — SODIUM CHLORIDE (PF) 0.9 % IJ SOLN
OROMUCOSAL | Status: DC | PRN
  Administered 2021-04-05 (×3): 4 mL via TOPICAL

## 2021-04-05 MED ORDER — SODIUM CHLORIDE 0.9% FLUSH
10.0000 mL | Freq: Two times a day (BID) | INTRAVENOUS | Status: DC
  Administered 2021-04-05 – 2021-04-06 (×3): 40 mL
  Administered 2021-04-07: 20 mL
  Administered 2021-04-07 – 2021-04-08 (×2): 10 mL

## 2021-04-05 MED ORDER — SODIUM CHLORIDE 0.9 % IV SOLN
20.0000 ug | INTRAVENOUS | Status: AC
  Administered 2021-04-05: 20 ug via INTRAVENOUS
  Filled 2021-04-05 (×2): qty 5

## 2021-04-05 MED ORDER — FENTANYL CITRATE (PF) 250 MCG/5ML IJ SOLN
INTRAMUSCULAR | Status: AC
Start: 2021-04-05 — End: ?
  Filled 2021-04-05: qty 5

## 2021-04-05 MED ORDER — SODIUM BICARBONATE 8.4 % IV SOLN
50.0000 meq | Freq: Once | INTRAVENOUS | Status: AC
  Administered 2021-04-05: 50 meq via INTRAVENOUS

## 2021-04-05 MED ORDER — MORPHINE SULFATE (PF) 2 MG/ML IV SOLN
1.0000 mg | INTRAVENOUS | Status: DC | PRN
  Administered 2021-04-05: 2 mg via INTRAVENOUS
  Administered 2021-04-05 (×2): 4 mg via INTRAVENOUS
  Administered 2021-04-05: 2 mg via INTRAVENOUS
  Administered 2021-04-05: 4 mg via INTRAVENOUS
  Administered 2021-04-05 – 2021-04-06 (×2): 2 mg via INTRAVENOUS
  Administered 2021-04-06 (×2): 4 mg via INTRAVENOUS
  Filled 2021-04-05: qty 1
  Filled 2021-04-05 (×4): qty 2
  Filled 2021-04-05: qty 1
  Filled 2021-04-05: qty 2
  Filled 2021-04-05 (×2): qty 1
  Filled 2021-04-05 (×2): qty 2

## 2021-04-05 MED ORDER — ROCURONIUM BROMIDE 10 MG/ML (PF) SYRINGE
PREFILLED_SYRINGE | INTRAVENOUS | Status: DC | PRN
  Administered 2021-04-05: 50 mg via INTRAVENOUS
  Administered 2021-04-05 (×2): 20 mg via INTRAVENOUS
  Administered 2021-04-05: 30 mg via INTRAVENOUS
  Administered 2021-04-05: 80 mg via INTRAVENOUS

## 2021-04-05 MED ORDER — AMIODARONE HCL IN DEXTROSE 360-4.14 MG/200ML-% IV SOLN
INTRAVENOUS | Status: AC
  Filled 2021-04-05: qty 200

## 2021-04-05 MED ORDER — HEPARIN SODIUM (PORCINE) 1000 UNIT/ML IJ SOLN
INTRAMUSCULAR | Status: DC | PRN
  Administered 2021-04-05: 17000 [IU] via INTRAVENOUS
  Administered 2021-04-05: 3000 [IU] via INTRAVENOUS

## 2021-04-05 MED ORDER — ROCURONIUM BROMIDE 10 MG/ML (PF) SYRINGE
PREFILLED_SYRINGE | INTRAVENOUS | Status: AC
  Filled 2021-04-05: qty 20

## 2021-04-05 MED ORDER — DEXTROSE 50 % IV SOLN: 0.0000 mL | INTRAVENOUS | Status: DC | PRN

## 2021-04-05 MED ORDER — ASPIRIN EC 325 MG PO TBEC
325.0000 mg | DELAYED_RELEASE_TABLET | Freq: Every day | ORAL | Status: DC
  Administered 2021-04-06 – 2021-04-10 (×5): 325 mg via ORAL
  Filled 2021-04-05 (×5): qty 1

## 2021-04-05 MED ORDER — PROPOFOL 10 MG/ML IV BOLUS
INTRAVENOUS | Status: AC
  Filled 2021-04-05: qty 20

## 2021-04-05 MED ORDER — LACTATED RINGERS IV SOLN: 500.0000 mL | Freq: Once | INTRAVENOUS | Status: DC | PRN

## 2021-04-05 MED ORDER — DOPAMINE-DEXTROSE 3.2-5 MG/ML-% IV SOLN: 3.0000 ug/kg/min | INTRAVENOUS | Status: DC

## 2021-04-05 MED ORDER — PROTAMINE SULFATE 10 MG/ML IV SOLN
INTRAVENOUS | Status: DC | PRN
  Administered 2021-04-05 (×2): 20 mg via INTRAVENOUS
  Administered 2021-04-05: 180 mg via INTRAVENOUS

## 2021-04-05 MED ORDER — DOPAMINE-DEXTROSE 3.2-5 MG/ML-% IV SOLN
INTRAVENOUS | Status: DC | PRN
  Administered 2021-04-05: 3 ug/kg/min via INTRAVENOUS

## 2021-04-05 MED ORDER — SODIUM CHLORIDE 0.45 % IV SOLN: INTRAVENOUS | Status: DC | PRN

## 2021-04-05 MED ORDER — 0.9 % SODIUM CHLORIDE (POUR BTL) OPTIME
TOPICAL | Status: DC | PRN
  Administered 2021-04-05: 5000 mL

## 2021-04-05 MED ORDER — SODIUM CHLORIDE 0.9 % IV SOLN: 20.0000 ug | Freq: Once | INTRAVENOUS | Status: DC

## 2021-04-05 MED ORDER — NITROGLYCERIN IN D5W 200-5 MCG/ML-% IV SOLN: 0.0000 ug/min | INTRAVENOUS | Status: DC

## 2021-04-05 MED ORDER — BISACODYL 5 MG PO TBEC
10.0000 mg | DELAYED_RELEASE_TABLET | Freq: Every day | ORAL | Status: DC
  Administered 2021-04-06 – 2021-04-09 (×4): 10 mg via ORAL
  Filled 2021-04-05 (×5): qty 2

## 2021-04-05 MED ORDER — SODIUM CHLORIDE 0.9% FLUSH: 3.0000 mL | INTRAVENOUS | Status: DC | PRN

## 2021-04-05 MED ORDER — CHLORHEXIDINE GLUCONATE 0.12% ORAL RINSE (MEDLINE KIT)
15.0000 mL | Freq: Two times a day (BID) | OROMUCOSAL | Status: DC
  Administered 2021-04-05: 15 mL via OROMUCOSAL

## 2021-04-05 MED ORDER — ONDANSETRON HCL 4 MG/2ML IJ SOLN
4.0000 mg | Freq: Four times a day (QID) | INTRAMUSCULAR | Status: DC | PRN
  Administered 2021-04-05 – 2021-04-10 (×4): 4 mg via INTRAVENOUS
  Filled 2021-04-05 (×4): qty 2

## 2021-04-05 SURGICAL SUPPLY — 97 items
ADAPTER CARDIO PERF ANTE/RETRO (ADAPTER) ×3 IMPLANT
ADPR PRFSN 84XANTGRD RTRGD (ADAPTER) ×2
AGENT HMST KT MTR STRL THRMB (HEMOSTASIS) ×2
BAG DECANTER FOR FLEXI CONT (MISCELLANEOUS) ×3 IMPLANT
BLADE STERNUM SYSTEM 6 (BLADE) ×3 IMPLANT
BLADE SURG 12 STRL SS (BLADE) ×3 IMPLANT
BNDG CMPR MED 10X6 ELC LF (GAUZE/BANDAGES/DRESSINGS) ×2
BNDG ELASTIC 4X5.8 VLCR STR LF (GAUZE/BANDAGES/DRESSINGS) ×3 IMPLANT
BNDG ELASTIC 6X10 VLCR STRL LF (GAUZE/BANDAGES/DRESSINGS) ×1 IMPLANT
BNDG ELASTIC 6X5.8 VLCR STR LF (GAUZE/BANDAGES/DRESSINGS) ×3 IMPLANT
BNDG GAUZE ELAST 4 BULKY (GAUZE/BANDAGES/DRESSINGS) ×3 IMPLANT
CANISTER SUCT 3000ML PPV (MISCELLANEOUS) ×3 IMPLANT
CANNULA GUNDRY RCSP 15FR (MISCELLANEOUS) ×3 IMPLANT
CATH CPB KIT VANTRIGT (MISCELLANEOUS) ×3 IMPLANT
CATH ROBINSON RED A/P 18FR (CATHETERS) ×9 IMPLANT
CATH THORACIC 28FR RT ANG (CATHETERS) ×3 IMPLANT
CATH THORACIC 36FR RT ANG (CATHETERS) IMPLANT
CLIP TI WIDE RED SMALL 24 (CLIP) ×1 IMPLANT
CONTAINER PROTECT SURGISLUSH (MISCELLANEOUS) ×6 IMPLANT
DRAIN CHANNEL 28F RND 3/8 FF (WOUND CARE) IMPLANT
DRAIN CHANNEL 32F RND 10.7 FF (WOUND CARE) ×1 IMPLANT
DRAPE CARDIOVASCULAR INCISE (DRAPES) ×3
DRAPE SLUSH/WARMER DISC (DRAPES) ×3 IMPLANT
DRAPE SRG 135X102X78XABS (DRAPES) ×2 IMPLANT
DRSG AQUACEL AG ADV 3.5X14 (GAUZE/BANDAGES/DRESSINGS) ×3 IMPLANT
ELECT BLADE 4.0 EZ CLEAN MEGAD (MISCELLANEOUS) ×3
ELECT BLADE 6.5 EXT (BLADE) ×3 IMPLANT
ELECT CAUTERY BLADE 6.4 (BLADE) ×3 IMPLANT
ELECT REM PT RETURN 9FT ADLT (ELECTROSURGICAL) ×6
ELECTRODE BLDE 4.0 EZ CLN MEGD (MISCELLANEOUS) ×2 IMPLANT
ELECTRODE REM PT RTRN 9FT ADLT (ELECTROSURGICAL) ×4 IMPLANT
FELT TEFLON 1X6 (MISCELLANEOUS) ×6 IMPLANT
GAUZE 4X4 16PLY ~~LOC~~+RFID DBL (SPONGE) ×3 IMPLANT
GAUZE SPONGE 4X4 12PLY STRL (GAUZE/BANDAGES/DRESSINGS) ×6 IMPLANT
GLOVE SURG ENC MOIS LTX SZ7.5 (GLOVE) ×9 IMPLANT
GLOVE SURG MICRO LTX SZ6 (GLOVE) ×5 IMPLANT
GOWN STRL REUS W/ TWL LRG LVL3 (GOWN DISPOSABLE) ×8 IMPLANT
GOWN STRL REUS W/TWL LRG LVL3 (GOWN DISPOSABLE) ×12
HEMOSTAT POWDER SURGIFOAM 1G (HEMOSTASIS) ×9 IMPLANT
HEMOSTAT SURGICEL 2X14 (HEMOSTASIS) ×3 IMPLANT
INSERT FOGARTY XLG (MISCELLANEOUS) IMPLANT
KIT BASIN OR (CUSTOM PROCEDURE TRAY) ×3 IMPLANT
KIT SUCTION CATH 14FR (SUCTIONS) ×3 IMPLANT
KIT TURNOVER KIT B (KITS) ×3 IMPLANT
KIT VASOVIEW HEMOPRO 2 VH 4000 (KITS) ×3 IMPLANT
LEAD PACING MYOCARDI (MISCELLANEOUS) ×3 IMPLANT
MARKER GRAFT CORONARY BYPASS (MISCELLANEOUS) ×9 IMPLANT
NS IRRIG 1000ML POUR BTL (IV SOLUTION) ×15 IMPLANT
PACK ACCESSORY CANNULA KIT (KITS) ×1 IMPLANT
PACK E OPEN HEART (SUTURE) ×3 IMPLANT
PACK OPEN HEART (CUSTOM PROCEDURE TRAY) ×3 IMPLANT
PAD ARMBOARD 7.5X6 YLW CONV (MISCELLANEOUS) ×6 IMPLANT
PAD ELECT DEFIB RADIOL ZOLL (MISCELLANEOUS) ×3 IMPLANT
PENCIL BUTTON HOLSTER BLD 10FT (ELECTRODE) ×3 IMPLANT
POSITIONER HEAD DONUT 9IN (MISCELLANEOUS) ×3 IMPLANT
POWDER SURGICEL 3.0 GRAM (HEMOSTASIS) ×3 IMPLANT
PUNCH AORTIC ROTATE 4.0MM (MISCELLANEOUS) IMPLANT
PUNCH AORTIC ROTATE 4.5MM 8IN (MISCELLANEOUS) ×1 IMPLANT
PUNCH AORTIC ROTATE 5MM 8IN (MISCELLANEOUS) IMPLANT
SET MPS 3-ND DEL (MISCELLANEOUS) ×1 IMPLANT
SPONGE T-LAP 18X18 ~~LOC~~+RFID (SPONGE) ×13 IMPLANT
SPONGE T-LAP 4X18 ~~LOC~~+RFID (SPONGE) ×4 IMPLANT
SUPPORT HEART JANKE-BARRON (MISCELLANEOUS) ×3 IMPLANT
SURGIFLO W/THROMBIN 8M KIT (HEMOSTASIS) ×3 IMPLANT
SUT BONE WAX W31G (SUTURE) ×3 IMPLANT
SUT MNCRL AB 4-0 PS2 18 (SUTURE) IMPLANT
SUT PROLENE 3 0 SH DA (SUTURE) IMPLANT
SUT PROLENE 3 0 SH1 36 (SUTURE) IMPLANT
SUT PROLENE 4 0 RB 1 (SUTURE) ×3
SUT PROLENE 4 0 SH DA (SUTURE) ×4 IMPLANT
SUT PROLENE 4-0 RB1 .5 CRCL 36 (SUTURE) ×2 IMPLANT
SUT PROLENE 5 0 C 1 36 (SUTURE) ×3 IMPLANT
SUT PROLENE 6 0 C 1 30 (SUTURE) ×1 IMPLANT
SUT PROLENE 6 0 CC (SUTURE) ×15 IMPLANT
SUT PROLENE 8 0 BV175 6 (SUTURE) ×4 IMPLANT
SUT PROLENE BLUE 7 0 (SUTURE) ×4 IMPLANT
SUT SILK  1 MH (SUTURE)
SUT SILK 1 MH (SUTURE) IMPLANT
SUT SILK 2 0 SH CR/8 (SUTURE) IMPLANT
SUT SILK 3 0 SH CR/8 (SUTURE) IMPLANT
SUT STEEL 6MS V (SUTURE) ×5 IMPLANT
SUT STEEL SZ 6 DBL 3X14 BALL (SUTURE) ×3 IMPLANT
SUT VIC AB 1 CTX 36 (SUTURE) ×9
SUT VIC AB 1 CTX36XBRD ANBCTR (SUTURE) ×4 IMPLANT
SUT VIC AB 2-0 CT1 27 (SUTURE) ×3
SUT VIC AB 2-0 CT1 TAPERPNT 27 (SUTURE) IMPLANT
SUT VIC AB 2-0 CTX 27 (SUTURE) IMPLANT
SUT VIC AB 3-0 X1 27 (SUTURE) ×1 IMPLANT
SYSTEM SAHARA CHEST DRAIN ATS (WOUND CARE) ×3 IMPLANT
TAPE CLOTH SURG 4X10 WHT LF (GAUZE/BANDAGES/DRESSINGS) ×1 IMPLANT
TAPE PAPER 2X10 WHT MICROPORE (GAUZE/BANDAGES/DRESSINGS) ×1 IMPLANT
TOWEL GREEN STERILE (TOWEL DISPOSABLE) ×3 IMPLANT
TOWEL GREEN STERILE FF (TOWEL DISPOSABLE) ×4 IMPLANT
TRAY FOLEY SLVR 16FR TEMP STAT (SET/KITS/TRAYS/PACK) ×3 IMPLANT
TUBING LAP HI FLOW INSUFFLATIO (TUBING) ×3 IMPLANT
UNDERPAD 30X36 HEAVY ABSORB (UNDERPADS AND DIAPERS) ×3 IMPLANT
WATER STERILE IRR 1000ML POUR (IV SOLUTION) ×6 IMPLANT

## 2021-04-05 NOTE — Progress Notes (Signed)
Transported to OR via stretcher. Report given to Melinda Crutch. Informed Shane to collect ABG sample upon A line insertion.  ?

## 2021-04-05 NOTE — Transfer of Care (Signed)
Immediate Anesthesia Transfer of Care Note ? ?Patient: Samantha Osborne ? ?Procedure(s) Performed: CORONARY ARTERY BYPASS GRAFTING (CABG) x3, Using Left Internal Mammory Artery and Right Leg greater saphenous vein harvested endoscopically (Chest) ?TRANSESOPHAGEAL ECHOCARDIOGRAM (TEE) ? ?Patient Location: SICU ? ?Anesthesia Type:General ? ?Level of Consciousness: Patient remains intubated per anesthesia plan ? ?Airway & Oxygen Therapy: Patient remains intubated per anesthesia plan ? ?Post-op Assessment: Report given to RN and Post -op Vital signs reviewed and stable ? ?Post vital signs: Reviewed and stable ? ?Last Vitals:  ?Vitals Value Taken Time  ?BP    ?Temp    ?Pulse    ?Resp 13 04/05/21 1611  ?SpO2    ?Vitals shown include unvalidated device data. ? ?Last Pain:  ?Vitals:  ? 04/05/21 0507  ?TempSrc: Oral  ?PainSc:   ?   ? ?Patients Stated Pain Goal: 0 (04/04/21 1957) ? ?Complications: No notable events documented. ?

## 2021-04-05 NOTE — Progress Notes (Signed)
ANTICOAGULATION CONSULT NOTE  ? ?Pharmacy Consult for IV heparin ?Indication: CAD planning CABG 3/15 ? ?Allergies  ?Allergen Reactions  ? Pravastatin Other (See Comments)  ?  Statin intolerance  ? Zetia [Ezetimibe] Other (See Comments)  ? ? ?Patient Measurements: ?Height: '5\' 3"'$  (160 cm) ?Weight: 56.1 kg (123 lb 10.9 oz) ?IBW/kg (Calculated) : 52.4 ?Heparin Dosing Weight: 55.6 kg ? ?Vital Signs: ?Temp: 97.8 ?F (36.6 ?C) (03/15 0507) ?Temp Source: Oral (03/15 0507) ?BP: 115/56 (03/15 0919) ?Pulse Rate: 78 (03/15 0922) ? ?Labs: ?Recent Labs  ?  04/03/21 ?0132 04/03/21 ?0711 04/03/21 ?0711 04/03/21 ?1033 04/04/21 ?8841 04/04/21 ?0801 04/05/21 ?0429  ?HGB 11.9*  --   --   --  11.4*  --  11.3*  ?HCT 36.0  --   --   --  34.4*  --  33.7*  ?PLT 234  --   --   --  215  --  245  ?APTT  --   --   --   --   --  102*  --   ?LABPROT  --   --   --   --   --  13.6  --   ?INR  --   --   --   --   --  1.0  --   ?HEPARINUNFRC  --   --   --   --   --  0.46 0.76*  ?CREATININE  --  1.83*   < > 1.66* 1.57*  --  1.50*  ?TROPONINIHS  --  50*  --   --   --   --   --   ? < > = values in this interval not displayed.  ? ? ? ?Estimated Creatinine Clearance: 30.1 mL/min (A) (by C-G formula based on SCr of 1.5 mg/dL (H)). ? ? ?Medical History: ?Past Medical History:  ?Diagnosis Date  ? Carotid artery occlusion   ? DDD (degenerative disc disease)   ? Depression   ? DVT (deep venous thrombosis) ([place]) 2008  ? right leg  ? Hypertension   ? Thyroid disease   ? ? ?Medications:  ?Infusions:  ? sodium chloride 50 mL/hr at 04/04/21 1829  ?  ceFAZolin (ANCEF) IV    ?  ceFAZolin (ANCEF) IV    ? dexmedetomidine    ? heparin 30,000 units/NS 1000 mL solution for CELLSAVER    ? heparin Stopped (04/05/21 0745)  ? milrinone    ? [MAR Hold] nitroGLYCERIN 5 mcg/min (04/05/21 0514)  ? norepinephrine    ? tranexamic acid (CYKLOKAPRON) infusion ([facility])    ? vancomycin    ? ? ?Assessment: ?66 yo female s/p cath 3/13 with multivessel dx, TCTS consult >planning CABG 3/15   Pharmacy asked to initiate anticoagulation with IV heparin 8 hrs after sheath removed.  Sheath out ~ 3 PM 3/13 ? ?Heparin level this AM below goal range, but then was turned off for OR.  No overt bleeding or complications noted. ? ?Goal of Therapy:  ?Heparin level 0.3-0.7 units/ml ?Monitor platelets by anticoagulation protocol: Yes ?  ?Plan:  ?CABG today. ? ?[place] Crane, Pharm D, BCPS, BCCP ?Clinical Pharmacist ? 04/05/2021 9:41 AM  ? ?[facility] pharmacy phone numbers are listed on [email or web address] ? ? ? ?

## 2021-04-05 NOTE — Progress Notes (Signed)
No chest pain. SBP <100. Consulted on call cardio. Titrate down Nitro drip per order of Dr. Rudi Rummage. ?

## 2021-04-05 NOTE — Procedures (Signed)
Extubation Procedure Note ? ?Patient Details:   ?Name: Samantha Osborne ?DOB: 1955-04-27 ?MRN: [id number] ?  ?Airway Documentation:  ?  ?Vent end date: 04/05/21 Vent end time: 2202  ? ?Evaluation ? O2 sats: stable throughout ?Complications: No apparent complications ?Patient did tolerate procedure well. ?Bilateral Breath Sounds: Clear, Diminished ?  ?Yes ?FVC: 872m ?NIF: -27 ?Positive Cuff Leak ?Pt on 5L O2 ? ?[name] ?04/05/2021, 10:03 PM ? ?

## 2021-04-05 NOTE — Progress Notes (Signed)
EVENING ROUNDS NOTE : ? ?   ?[address] ?      [place] [address] ?            [phone]   ?              ?Day of Surgery ?Procedure(s) (LRB): ?CORONARY ARTERY BYPASS GRAFTING (CABG) x3, Using Left Internal Mammory Artery and Right Leg greater saphenous vein harvested endoscopically (N/A) ?TRANSESOPHAGEAL ECHOCARDIOGRAM (TEE) (N/A) ? ? ?Total Length of Stay:  LOS: 4 days  ?Events:   ?Making lots of urine ?Low CT output ?Weaning  ? ? ? ?BP (!) 106/47 (BP Location: Right Arm)   Pulse 93   Temp (!) 96.4 ?F (35.8 ?C)   Resp (!) 0   Ht '5\' 3"'$  (1.6 m)   Wt 56.1 kg   SpO2 97%   BMI 21.91 kg/m?  ? ?PAP: (15-39)/(2-22) 27/16 ?CVP:  [8 mmHg] 8 mmHg ?CO:  [3.6 L/min-4 L/min] 3.6 L/min ?CI:  [2.3 L/min/m2-2.5 L/min/m2] 2.3 L/min/m2 ? ?Vent Mode: SIMV;PSV;PRVC ?FiO2 (%):  [50 %] 50 % ?Set Rate:  [12 bmp-18 bmp] 18 bmp ?Vt Set:  [410 mL-500 mL] 500 mL ?PEEP:  [5 cmH20] 5 cmH20 ?Pressure Support:  [10 cmH20] 10 cmH20 ?Plateau Pressure:  [19 cmH20] 19 cmH20 ? ? sodium chloride Stopped (04/05/21 1747)  ? [START ON 04/06/2021] sodium chloride    ? sodium chloride 10 mL/hr at 04/05/21 1639  ? albumin human 12.5 g (04/05/21 1737)  ?  ceFAZolin (ANCEF) IV Stopped (04/05/21 1702)  ? dexmedetomidine (PRECEDEX) IV infusion 0.8 mcg/kg/hr (04/05/21 1800)  ? DOPamine 3 mcg/kg/min (04/05/21 1800)  ? famotidine (PEPCID) IV    ? insulin 2.6 Units/hr (04/05/21 1800)  ? lactated ringers    ? lactated ringers    ? lactated ringers 20 mL/hr at 04/05/21 1800  ? magnesium sulfate 20 mL/hr at 04/05/21 1800  ? milrinone 0.25 mcg/kg/min (04/05/21 1800)  ? nitroGLYCERIN Stopped (04/05/21 1651)  ? phenylephrine (NEO-SYNEPHRINE) Adult infusion 10 mcg/min (04/05/21 1800)  ? potassium chloride    ? vancomycin    ? ? ?I/O last 3 completed shifts: ?In: 1215.5 [P.O.:600; I.V.:615.5] ?Out: 2750 [Urine:2750] ? ? ?CBC Latest Ref Rng & Units 04/05/2021 04/05/2021 04/05/2021  ?WBC 4.0 - 10.5 K/uL 29.3(H) - -  ?Hemoglobin 12.0 - 15.0 g/dL  11.1(L) 9.9(L) 9.2(L)  ?Hematocrit 36.0 - 46.0 % 33.8(L) 29.0(L) 27.0(L)  ?Platelets 150 - 400 K/uL 137(L) - -  ? ? ?BMP Latest Ref Rng & Units 04/05/2021 04/05/2021 04/05/2021  ?Glucose 70 - 99 mg/dL - 186(H) -  ?BUN 8 - 23 mg/dL - 16 -  ?Creatinine 0.44 - 1.00 mg/dL - 1.00 -  ?BUN/Creat Ratio 12 - 28 - - -  ?Sodium 135 - 145 mmol/L 136 135 134(L)  ?Potassium 3.5 - 5.1 mmol/L 4.9 4.9 5.7(H)  ?Chloride 98 - 111 mmol/L - 99 -  ?CO2 22 - 32 mmol/L - - -  ?Calcium 8.9 - 10.3 mg/dL - - -  ? ? ?ABG ?   ?Component Value Date/Time  ? PHART 7.324 (L) 04/05/2021 1449  ? PCO2ART 43.4 04/05/2021 1449  ? PO2ART 167 (H) 04/05/2021 1449  ? HCO3 22.6 04/05/2021 1449  ? TCO2 24 04/05/2021 1449  ? ACIDBASEDEF 3.0 (H) 04/05/2021 1449  ? O2SAT 99 04/05/2021 1449  ? ? ? ? ? ?Melodie Bouillon, MD ?04/05/2021 6:03 PM ? ? ?

## 2021-04-05 NOTE — Anesthesia Preprocedure Evaluation (Signed)
Anesthesia Evaluation  ?Patient identified by MRN, date of birth, ID band ?Patient awake ? ? ? ?Reviewed: ?Allergy & Precautions, H&P , NPO status , Patient's Chart, lab work & pertinent test results ? ?Airway ?Mallampati: II ? ? ?Neck ROM: full ? ? ? Dental ?  ?Pulmonary ?Current Smoker,  ?  ?breath sounds clear to auscultation ? ? ? ? ? ? Cardiovascular ?hypertension, + CAD  ? ?Rhythm:regular Rate:Normal ? ? ?  ?Neuro/Psych ?PSYCHIATRIC DISORDERS Depression   ? GI/Hepatic ?GERD  ,  ?Endo/Other  ? ? Renal/GU ?Renal InsufficiencyRenal disease  ? ?  ?Musculoskeletal ? ?(+) Arthritis ,  ? Abdominal ?  ?Peds ? Hematology ?  ?Anesthesia Other Findings ? ? Reproductive/Obstetrics ? ?  ? ? ? ? ? ? ? ? ? ? ? ? ? ?  ?  ? ? ? ? ? ? ? ? ?Anesthesia Physical ?Anesthesia Plan ? ?ASA: 3 ? ?Anesthesia Plan: General  ? ?Post-op Pain Management:   ? ?Induction: Intravenous ? ?PONV Risk Score and Plan: 2 and Ondansetron, Dexamethasone, Midazolam and Treatment may vary due to age or medical condition ? ?Airway Management Planned: Oral ETT ? ?Additional Equipment: Arterial line, CVP, PA Cath, Ultrasound Guidance Line Placement and TEE ? ?Intra-op Plan:  ? ?Post-operative Plan: Post-operative intubation/ventilation ? ?Informed Consent: I have reviewed the patients History and Physical, chart, labs and discussed the procedure including the risks, benefits and alternatives for the proposed anesthesia with the patient or authorized representative who has indicated his/her understanding and acceptance.  ? ? ? ?Dental advisory given ? ?Plan Discussed with: CRNA, Anesthesiologist and Surgeon ? ?Anesthesia Plan Comments:   ? ? ? ? ? ? ?Anesthesia Quick Evaluation ? ?

## 2021-04-05 NOTE — Hospital Course (Addendum)
Referring: Dr. Irish Lack, MD ?Primary Care: Redmond School, MD ?Primary Cardiologist:Branch, Roderic Palau, MD ? ?(Follow up appts requested on 3/15) ? ? 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 Patient describes chest burning and aching, pain in both sides of neck, and radiating to left arm. She sometimes has shortness of breath with episodes but denies nausea or LE edema. She has been having night sweats for a few months, which she does not think is related . Of note, this chest pain has been occurring for over one month and she ws scheduled for a coronary CT this week by Dr. Harl Bowie. EKG showed sinu rhythm, slight ST depression in Leads I and AVL. Troponin I (high sensitivity) initially was 21 and went up to 51.  ?She was transferred to [facility] for further evaluation and treatment.  ?Chest x ray showed chronic interstitial opacities. ?Echo showed LVEF 60-65%, no significant valvular abnormalities (trivial MR), and no pericardial effusion. Cardiac catheterization done today showed distal left main stenosis 90%, ostial Circumflex with a 75% stenosis, and mid to distal RCA 75% stenosis. A cardiothoracic consultation was requested with Dr. Prescott Gum for the consideration of coronary revascularization. At the time of my exam, patient is in no acute distress, BP is 129/76, HR 90, oxygenation is 100% on room air. She denies chest pain, pressure, or shortness of breath. Her husband is at the bedside. ?  ?Hospital Course: ? ?Coronary bypass grafting was offered to Ms. Cuadras by Dr. Prescott Gum and she decided to precede with surgery. She remained stable after the left heart cath.  She was taken to the OR on 04/05/21 where CABG x 3 was carried out  without complication.  The left internal mammary artery was grafted to the left anterior descending coronary artery. Separate saphenous vein grafts were placed to the obtuse marginal and posterior descending coronary arteries.  Following the procedure she separated from cardiopulmonary bypass without difficulty and was transferred to the surgical ICU in stable condition.  The patient was weaned and extubated the evening of surgery.  The patient required pressor support with Dopamine, Milrinone, and Neo-synephrine.  These were weaned as hemodynamics allowed.  Patient had issues with pain control.  She is on narcotics chronically at home and her home regimen was resumed.  Due to her chronic use prior to admission she will not be provided a prescription for these medications at discharge.  She had issues with nausea and vomiting.  She was treated with reglan and compazine in addition to zofran with relief of symptoms.  The patient was hypertensive and her home Norvasc was restarted.  The patient was tachycardic and was started on a beta blocker once Milrinone was discontinued.  The patient developed Atrial Fibrillation and was treated with IV Amiodarone bolus and drip therapy.  She converted to NSR without difficulty.  She was later transitioned to an oral regimen and Lopressor was increased. She had thrombocytopenia post op but last platelet count on 03/19 was up to 149,000. She had expected post op blood loss anemia and last H and H was stable at 9.2 and 27.1. She was felt surgically stable for transfer from the ICU to 4E on 04/08/2021. She is ambulating with good oxygenation on room air.  She has been tolerating a diet and has had a bowel movement. All wounds are clean, dry, and healing without signs of infection. Epicardial pacing wires were removed on 03/19. Creatinine was slightly increased from 1.3 to 1.6 on 03/19 so Lasix was held.  Her creatinine level improved to 1.50.  She remains tachycardic, her norvasc was  discontinued and her Lopressor dose was titrated.  She has moved her bowels.  Her surgical incisions are healing without evidence of infection.  She is medically stable for discharge home today. ?

## 2021-04-05 NOTE — Anesthesia Procedure Notes (Signed)
Central Venous Catheter Insertion ?Performed by: Albertha Ghee, MD, anesthesiologist ?Start/End3/15/2023 9:12 AM, 04/05/2021 9:14 AM ?Patient location: Pre-op. ?Preanesthetic checklist: patient identified, IV checked, site marked, risks and benefits discussed, surgical consent, monitors and equipment checked, pre-op evaluation, timeout performed and anesthesia consent ?Hand hygiene performed  and maximum sterile barriers used  ?PA cath was placed.Swan type:thermodilution ?Procedure performed without using ultrasound guided technique. ?Attempts: 1 ?Patient tolerated the procedure well with no immediate complications. ? ? ? ?

## 2021-04-05 NOTE — Anesthesia Procedure Notes (Addendum)
Procedure Name: Intubation ?Date/Time: 04/05/2021 9:54 AM ?Performed by: Vonna Drafts, CRNA ?Pre-anesthesia Checklist: Patient identified, Emergency Drugs available, Suction available and Patient being monitored ?Patient Re-evaluated:Patient Re-evaluated prior to induction ?Oxygen Delivery Method: Circle system utilized ?Preoxygenation: Pre-oxygenation with 100% oxygen ?Induction Type: IV induction ?Ventilation: Mask ventilation without difficulty ?Laryngoscope Size: Mac and 3 ?Grade View: Grade I ?Tube type: Oral ?Tube size: 8.0 mm ?Number of attempts: 1 ?Airway Equipment and Method: Stylet and Oral airway ?Placement Confirmation: ETT inserted through vocal cords under direct vision, positive ETCO2 and breath sounds checked- equal and bilateral ?Secured at: 23 cm ?Tube secured with: Tape ?Dental Injury: Teeth and Oropharynx as per pre-operative assessment  ? ? ? ? ?

## 2021-04-05 NOTE — Progress Notes (Signed)
?  Echocardiogram ?Echocardiogram Transesophageal has been performed. ? ?Samantha Osborne ?04/05/2021, 10:52 AM ?

## 2021-04-05 NOTE — Anesthesia Procedure Notes (Signed)
Central Venous Catheter Insertion ?Performed by: Albertha Ghee, MD, anesthesiologist ?Start/End3/15/2023 8:50 AM, 04/05/2021 9:02 AM ?Patient location: Pre-op. ?Preanesthetic checklist: patient identified, IV checked, site marked, risks and benefits discussed, surgical consent, monitors and equipment checked, pre-op evaluation, timeout performed and anesthesia consent ?Lidocaine 1% used for infiltration and patient sedated ?Hand hygiene performed  and maximum sterile barriers used  ?Catheter size: 8.5 Fr ?Sheath introducer ?Procedure performed using ultrasound guided technique. ?Ultrasound Notes:anatomy identified, needle tip was noted to be adjacent to the nerve/plexus identified, no ultrasound evidence of intravascular and/or intraneural injection and image(s) printed for medical record ?Attempts: 1 ?Following insertion, line sutured and dressing applied. ?Post procedure assessment: blood return through all ports, free fluid flow and no air ? ?Patient tolerated the procedure well with no immediate complications. ? ? ? ? ?

## 2021-04-05 NOTE — Progress Notes (Signed)
Pre Procedure note for inpatients: ?  ?Samantha Osborne has been scheduled for  Coronary bypass surgery today. The various methods of treatment have been discussed with the patient. After consideration of the risks, benefits and treatment options the patient has consented to the planned procedure.  ? ?The patient has been seen and labs reviewed. There are no changes in the patient?s condition to prevent proceeding with the planned procedure today. ? ?Recent labs: ? ?Lab Results  ?Component Value Date  ? WBC 12.3 (H) 04/05/2021  ? HGB 11.3 (L) 04/05/2021  ? HCT 33.7 (L) 04/05/2021  ? PLT 245 04/05/2021  ? GLUCOSE 115 (H) 04/05/2021  ? CHOL 105 04/03/2021  ? TRIG 126 04/03/2021  ? HDL 33 (L) 04/03/2021  ? LDLCALC 47 04/03/2021  ? ALT 12 04/04/2021  ? AST 15 04/04/2021  ? NA 139 04/05/2021  ? K 4.3 04/05/2021  ? CL 106 04/05/2021  ? CREATININE 1.50 (H) 04/05/2021  ? BUN 19 04/05/2021  ? CO2 25 04/05/2021  ? TSH 0.822 04/04/2021  ? INR 1.0 04/04/2021  ? HGBA1C 6.0 (H) 04/04/2021  ? ? ?Dahlia Byes, MD ?04/05/2021 7:15 AM ? ? ?   ?

## 2021-04-05 NOTE — Anesthesia Procedure Notes (Signed)
Arterial Line Insertion ?Start/End3/15/2023 8:15 AM, 04/05/2021 8:20 AM ?Performed by: Albertha Ghee, MD, Vonna Drafts, CRNA, CRNA ? Patient location: Pre-op. ?Preanesthetic checklist: patient identified, IV checked, site marked, risks and benefits discussed, surgical consent, monitors and equipment checked, pre-op evaluation, timeout performed and anesthesia consent ?Lidocaine 1% used for infiltration ?Left, radial was placed ?Catheter size: 20 G ?Hand hygiene performed  and maximum sterile barriers used  ? ?Attempts: 1 ?Procedure performed without using ultrasound guided technique. ?Following insertion, dressing applied and Biopatch. ?Post procedure assessment: normal ? ?Patient tolerated the procedure well with no immediate complications. ? ? ?

## 2021-04-05 NOTE — Brief Op Note (Addendum)
04/01/2021 - 04/05/2021 ? ?1:46 PM ? ?PATIENT:  Samantha Osborne  66 y.o. female ? ?PRE-OPERATIVE DIAGNOSIS:  MULTI-VESSEL AND ?LEFT MAIN CORONARY ARTERY DISEASE ? ?POST-OPERATIVE DIAGNOSIS:  MULTI-VESSEL AND ?LEFT MAIN CORONARY ARTERY DISEASE ? ?PROCEDURE:   ?CORONARY ARTERY BYPASS GRAFTING (CABG) X 3, Using Left Internal Mammary Artery and Right Leg greater saphenous vein harvested endoscopically ? ?LIMA-LAD ?SVG-OM ?SVG-PDA ? ?Findings: Very small coronary target vessels especially the circumflex marginal and LAD.  The internal mammary artery was placed to the second diagonal at its takeoff from the LAD.  The patient had preoperative anemia and 2 units packed cells were added to the bypass circuit early in the case. ? ?Vein harvest time: 51mn   Vein prep time: 225m ? ?TRANSESOPHAGEAL ECHOCARDIOGRAM (TEE) (N/A) ? ?SURGEON: VaLucianne Leiright ? ?PHYSICIAN ASSISTANT: Roddenberry ? ?ASSISTANTS: HaSammie BenchRN, RN First Assistant  ? ?ANESTHESIA:   general ? ?BLOOD ADMINISTERED: 2 units PRBCs given on pump ? ?DRAINS:  Mediastinal and left pleural tubes   ? ?LOCAL MEDICATIONS USED:  NONE ? ?SPECIMEN:  No Specimen ? ?DISPOSITION OF SPECIMEN:  N/A ? ?COUNTS:  Correct ? ?DICTATION: .Dragon Dictation ? ?PLAN OF CARE: Admit to inpatient  ? ?PATIENT DISPOSITION:  ICU - intubated and hemodynamically stable. ?  ?Delay start of Pharmacological VTE agent (>24hrs) due to surgical blood loss or risk of bleeding: yes ? ?

## 2021-04-06 ENCOUNTER — Inpatient Hospital Stay (HOSPITAL_COMMUNITY): Payer: Medicare Other

## 2021-04-06 LAB — ECHO INTRAOPERATIVE TEE
AV Mean grad: 4 mmHg
Height: 63 in
Weight: 1978.85 oz

## 2021-04-06 LAB — POCT I-STAT 7, (LYTES, BLD GAS, ICA,H+H)
Acid-base deficit: 3 mmol/L — ABNORMAL HIGH (ref 0.0–2.0)
Bicarbonate: 22.8 mmol/L (ref 20.0–28.0)
Calcium, Ion: 1.12 mmol/L — ABNORMAL LOW (ref 1.15–1.40)
HCT: 30 % — ABNORMAL LOW (ref 36.0–46.0)
Hemoglobin: 10.2 g/dL — ABNORMAL LOW (ref 12.0–15.0)
O2 Saturation: 97 %
Patient temperature: 98
Potassium: 4.1 mmol/L (ref 3.5–5.1)
Sodium: 136 mmol/L (ref 135–145)
TCO2: 24 mmol/L (ref 22–32)
pCO2 arterial: 40.7 mmHg (ref 32–48)
pH, Arterial: 7.355 (ref 7.35–7.45)
pO2, Arterial: 89 mmHg (ref 83–108)

## 2021-04-06 LAB — GLUCOSE, CAPILLARY
Glucose-Capillary: 112 mg/dL — ABNORMAL HIGH (ref 70–99)
Glucose-Capillary: 114 mg/dL — ABNORMAL HIGH (ref 70–99)
Glucose-Capillary: 120 mg/dL — ABNORMAL HIGH (ref 70–99)
Glucose-Capillary: 126 mg/dL — ABNORMAL HIGH (ref 70–99)
Glucose-Capillary: 131 mg/dL — ABNORMAL HIGH (ref 70–99)
Glucose-Capillary: 132 mg/dL — ABNORMAL HIGH (ref 70–99)
Glucose-Capillary: 135 mg/dL — ABNORMAL HIGH (ref 70–99)
Glucose-Capillary: 136 mg/dL — ABNORMAL HIGH (ref 70–99)
Glucose-Capillary: 165 mg/dL — ABNORMAL HIGH (ref 70–99)
Glucose-Capillary: 78 mg/dL (ref 70–99)

## 2021-04-06 LAB — CBC
HCT: 29.7 % — ABNORMAL LOW (ref 36.0–46.0)
HCT: 31.2 % — ABNORMAL LOW (ref 36.0–46.0)
Hemoglobin: 10.6 g/dL — ABNORMAL LOW (ref 12.0–15.0)
Hemoglobin: 9.9 g/dL — ABNORMAL LOW (ref 12.0–15.0)
MCH: 30.8 pg (ref 26.0–34.0)
MCH: 31.5 pg (ref 26.0–34.0)
MCHC: 33.3 g/dL (ref 30.0–36.0)
MCHC: 34 g/dL (ref 30.0–36.0)
MCV: 92.5 fL (ref 80.0–100.0)
MCV: 92.9 fL (ref 80.0–100.0)
Platelets: 125 10*3/uL — ABNORMAL LOW (ref 150–400)
Platelets: 131 10*3/uL — ABNORMAL LOW (ref 150–400)
RBC: 3.21 MIL/uL — ABNORMAL LOW (ref 3.87–5.11)
RBC: 3.36 MIL/uL — ABNORMAL LOW (ref 3.87–5.11)
RDW: 16.4 % — ABNORMAL HIGH (ref 11.5–15.5)
RDW: 16.6 % — ABNORMAL HIGH (ref 11.5–15.5)
WBC: 19.6 10*3/uL — ABNORMAL HIGH (ref 4.0–10.5)
WBC: 20.8 10*3/uL — ABNORMAL HIGH (ref 4.0–10.5)
nRBC: 0 % (ref 0.0–0.2)
nRBC: 0 % (ref 0.0–0.2)

## 2021-04-06 LAB — BASIC METABOLIC PANEL
Anion gap: 10 (ref 5–15)
Anion gap: 10 (ref 5–15)
BUN: 14 mg/dL (ref 8–23)
BUN: 16 mg/dL (ref 8–23)
CO2: 22 mmol/L (ref 22–32)
CO2: 24 mmol/L (ref 22–32)
Calcium: 7.1 mg/dL — ABNORMAL LOW (ref 8.9–10.3)
Calcium: 8.3 mg/dL — ABNORMAL LOW (ref 8.9–10.3)
Chloride: 103 mmol/L (ref 98–111)
Chloride: 99 mmol/L (ref 98–111)
Creatinine, Ser: 1.11 mg/dL — ABNORMAL HIGH (ref 0.44–1.00)
Creatinine, Ser: 1.38 mg/dL — ABNORMAL HIGH (ref 0.44–1.00)
GFR, Estimated: 54 mL/min — ABNORMAL LOW (ref >60)
GFR, Estimated: 42 mL/min — ABNORMAL LOW (ref >60)
Glucose, Bld: 178 mg/dL — ABNORMAL HIGH (ref 70–99)
Glucose, Bld: 128 mg/dL — ABNORMAL HIGH (ref 70–99)
Potassium: 4 mmol/L (ref 3.5–5.1)
Potassium: 4.6 mmol/L (ref 3.5–5.1)
Sodium: 135 mmol/L (ref 135–145)
Sodium: 133 mmol/L — ABNORMAL LOW (ref 135–145)

## 2021-04-06 LAB — MAGNESIUM
Magnesium: 2.8 mg/dL — ABNORMAL HIGH (ref 1.7–2.4)
Magnesium: 2.1 mg/dL (ref 1.7–2.4)

## 2021-04-06 MED ORDER — PROPOFOL 10 MG/ML IV BOLUS
INTRAVENOUS | Status: AC
  Filled 2021-04-06: qty 20

## 2021-04-06 MED ORDER — LIDOCAINE 5 % EX PTCH
1.0000 | MEDICATED_PATCH | CUTANEOUS | Status: DC
  Administered 2021-04-06 – 2021-04-09 (×4): 1 via TRANSDERMAL
  Filled 2021-04-06 (×5): qty 1

## 2021-04-06 MED ORDER — HYDROCODONE-ACETAMINOPHEN 10-325 MG PO TABS
1.0000 | ORAL_TABLET | ORAL | Status: DC | PRN
  Administered 2021-04-06 – 2021-04-09 (×8): 1 via ORAL
  Filled 2021-04-06 (×9): qty 1

## 2021-04-06 MED ORDER — CYCLOBENZAPRINE HCL 10 MG PO TABS
10.0000 mg | ORAL_TABLET | Freq: Three times a day (TID) | ORAL | Status: DC | PRN
  Administered 2021-04-06 – 2021-04-08 (×4): 10 mg via ORAL
  Filled 2021-04-06 (×6): qty 1

## 2021-04-06 MED ORDER — DEXAMETHASONE SODIUM PHOSPHATE 10 MG/ML IJ SOLN
INTRAMUSCULAR | Status: AC
  Filled 2021-04-06: qty 1

## 2021-04-06 MED ORDER — ENOXAPARIN SODIUM 40 MG/0.4ML IJ SOSY
40.0000 mg | PREFILLED_SYRINGE | Freq: Every day | INTRAMUSCULAR | Status: DC
  Administered 2021-04-06 – 2021-04-09 (×4): 40 mg via SUBCUTANEOUS
  Filled 2021-04-06 (×4): qty 0.4

## 2021-04-06 MED ORDER — METHOCARBAMOL 500 MG PO TABS
500.0000 mg | ORAL_TABLET | Freq: Four times a day (QID) | ORAL | Status: DC | PRN
  Administered 2021-04-06: 500 mg via ORAL
  Filled 2021-04-06: qty 1

## 2021-04-06 MED ORDER — ROCURONIUM BROMIDE 10 MG/ML (PF) SYRINGE
PREFILLED_SYRINGE | INTRAVENOUS | Status: AC
  Filled 2021-04-06: qty 10

## 2021-04-06 MED ORDER — INSULIN ASPART 100 UNIT/ML IJ SOLN
0.0000 [IU] | INTRAMUSCULAR | Status: DC
  Administered 2021-04-06 – 2021-04-07 (×6): 2 [IU] via SUBCUTANEOUS

## 2021-04-06 MED ORDER — ONDANSETRON HCL 4 MG/2ML IJ SOLN
INTRAMUSCULAR | Status: AC
  Filled 2021-04-06: qty 2

## 2021-04-06 MED ORDER — INSULIN ASPART 100 UNIT/ML IJ SOLN
0.0000 [IU] | INTRAMUSCULAR | Status: DC
  Administered 2021-04-06: 2 [IU] via SUBCUTANEOUS

## 2021-04-06 MED ORDER — ORAL CARE MOUTH RINSE
15.0000 mL | Freq: Two times a day (BID) | OROMUCOSAL | Status: DC
  Administered 2021-04-06 – 2021-04-10 (×9): 15 mL via OROMUCOSAL

## 2021-04-06 MED ORDER — OXYCODONE HCL 5 MG PO TABS
15.0000 mg | ORAL_TABLET | Freq: Four times a day (QID) | ORAL | Status: DC | PRN
Start: 2021-04-06 — End: 2021-04-10
  Administered 2021-04-06 – 2021-04-10 (×13): 15 mg via ORAL
  Filled 2021-04-06 (×13): qty 3

## 2021-04-06 MED ORDER — DEXMEDETOMIDINE (PRECEDEX) IN NS 20 MCG/5ML (4 MCG/ML) IV SYRINGE
PREFILLED_SYRINGE | INTRAVENOUS | Status: AC
  Filled 2021-04-06: qty 5

## 2021-04-06 MED ORDER — FENTANYL CITRATE PF 50 MCG/ML IJ SOSY
50.0000 ug | PREFILLED_SYRINGE | INTRAMUSCULAR | Status: DC | PRN
  Administered 2021-04-06 – 2021-04-07 (×7): 50 ug via INTRAVENOUS
  Filled 2021-04-06 (×7): qty 1

## 2021-04-06 MED ORDER — LEVALBUTEROL HCL 1.25 MG/0.5ML IN NEBU
1.2500 mg | INHALATION_SOLUTION | Freq: Four times a day (QID) | RESPIRATORY_TRACT | Status: DC
  Administered 2021-04-06 – 2021-04-07 (×3): 1.25 mg via RESPIRATORY_TRACT
  Filled 2021-04-06 (×3): qty 0.5

## 2021-04-06 MED ORDER — MIDAZOLAM HCL 2 MG/2ML IJ SOLN: 1.0000 mg | INTRAMUSCULAR | Status: DC | PRN

## 2021-04-06 MED ORDER — FUROSEMIDE 10 MG/ML IJ SOLN
20.0000 mg | Freq: Two times a day (BID) | INTRAMUSCULAR | Status: DC
  Administered 2021-04-06 – 2021-04-07 (×3): 20 mg via INTRAVENOUS
  Filled 2021-04-06 (×3): qty 2

## 2021-04-06 MED ORDER — METOCLOPRAMIDE HCL 5 MG/ML IJ SOLN
10.0000 mg | Freq: Four times a day (QID) | INTRAMUSCULAR | Status: AC
  Administered 2021-04-06 – 2021-04-07 (×4): 10 mg via INTRAVENOUS
  Filled 2021-04-06 (×4): qty 2

## 2021-04-06 MED ORDER — PROCHLORPERAZINE EDISYLATE 10 MG/2ML IJ SOLN
10.0000 mg | Freq: Four times a day (QID) | INTRAMUSCULAR | Status: DC | PRN
  Filled 2021-04-06: qty 2

## 2021-04-06 MED ORDER — INSULIN ASPART 100 UNIT/ML IJ SOLN: 0.0000 [IU] | INTRAMUSCULAR | Status: DC

## 2021-04-06 MED ORDER — FENTANYL CITRATE (PF) 250 MCG/5ML IJ SOLN
INTRAMUSCULAR | Status: AC
  Filled 2021-04-06: qty 5

## 2021-04-06 MED ORDER — NOREPINEPHRINE 4 MG/250ML-% IV SOLN: 2.0000 ug/min | INTRAVENOUS | Status: DC

## 2021-04-06 MED ORDER — LIDOCAINE 2% (20 MG/ML) 5 ML SYRINGE
INTRAMUSCULAR | Status: AC
  Filled 2021-04-06: qty 5

## 2021-04-06 MED ORDER — PROPOFOL 10 MG/ML IV BOLUS
INTRAVENOUS | Status: AC
Start: 2021-04-06 — End: ?
  Filled 2021-04-06: qty 20

## 2021-04-06 MED ORDER — PHENYLEPHRINE 40 MCG/ML (10ML) SYRINGE FOR IV PUSH (FOR BLOOD PRESSURE SUPPORT)
PREFILLED_SYRINGE | INTRAVENOUS | Status: AC
Start: 2021-04-06 — End: ?
  Filled 2021-04-06: qty 10

## 2021-04-06 NOTE — Op Note (Signed)
NAME: Samantha Osborne, Samantha D. ?MEDICAL RECORD NO: [id number] ?ACCOUNT NO: [id number] ?DATE OF BIRTH: [birth year] ?FACILITY: [facility] ?LOCATION: [facility] ?PHYSICIAN: Len Childs, MD ? ?Operative Report  ? ?DATE OF PROCEDURE: 04/06/2021 ? ?PROCEDURES PERFORMED: ?1.  Coronary artery bypass grafting x3 (left internal mammary artery to LAD--diagonal bifurcation, saphenous vein graft to OM1, saphenous vein graft to posterior descending). ?2.  Endoscopic harvest of right leg greater saphenous vein. ? ?PREOPERATIVE DIAGNOSIS:  Severe 3-vessel coronary artery disease with acute coronary syndrome, chronic lung disease with active smoking, chronic pain syndrome. ? ?POSTOPERATIVE DIAGNOSIS:  Severe 3-vessel coronary artery disease with acute coronary syndrome, chronic lung disease with active smoking, chronic pain syndrome. ? ?SURGEON:  Len Childs, MD ? ?ASSISTANT:  Enid Cutter, PA-C.  A surgical first assistant was required for the surgery due to the complexity of the procedure and the standard of care for cardiac surgery.  The first assistant was required for harvesting the saphenous vein  ?endoscopically and closing the leg incisions, assisting the surgeon with the distal anastomoses, and suture management, providing exposure, suctioning and retraction and general assistance. ? ?ANESTHESIA:  General by Dr. Percival Spanish. ? ?CLINICAL NOTE PROCEDURE:  The patient is a 66 year old female, smoker, who was admitted to the Emergency Room for accelerating chest pain and shortness of breath.  Her cardiac enzymes were mildly elevated and an echocardiogram showed preserved LV  ?systolic function with minimal valvular disease.  She underwent cardiac catheterization, which demonstrated significant left main stenosis as well as high-grade stenosis of the large dominant right coronary.  She was recommended for coronary bypass  ?grafting by her cardiology team and I saw the patient in consultation.  After reviewing the coronary arteriograms  and echocardiogram I agreed with the recommendation for coronary artery bypass surgery as the best long-term treatment of her coronary  ?artery disease to provide relief of symptoms, preservation of LV function, and improve long-term survival.  I discussed the procedure in detail with the patient including the use of general anesthesia and cardiopulmonary bypass, the location of the  ?surgical incisions, and the expected postoperative hospital recovery.  I discussed with the patient the risks to her for coronary bypass surgery, which mainly were pulmonary due to her smoking history.  She also understood there was risk of stroke,  ?bleeding, blood transfusion requirement, infection, organ failure, MI, arrhythmias, and death.  She understood these issues and agreed to proceed with surgery. ? ?OPERATIVE FINDINGS: ?1.  Very small coronary targets, especially the LAD and circumflex distribution. ?2.  Adequate saphenous vein. ?3.  Small mammary artery, but with adequate and satisfactory flow. ? ?Preoperative anemia, which required 2 units of packed cells to be added to the cardiopulmonary bypass circuit early in the case. ? ?DESCRIPTION OF PROCEDURE:  The patient was brought from preoperative holding where informed consent was documented and final issues were addressed with the patient in a face-to-face encounter.  The patient was placed supine on the operating table and  ?general anesthesia was induced.  She remained stable.  A transesophageal echo probe was placed by the anesthesia team.  The patient was then prepped and draped as a sterile field.  A proper timeout was performed.  As the saphenous vein was being  ?harvested endoscopically a sternal incision was made and the sternum was retracted.  The left internal mammary artery was harvested as a pedicle graft from its origin at the subclavian vessels.  It was 1.4 mm vessel with good flow.  The sternal retractor ?  was placed and the pericardium opened and suspended.   The aorta was inspected and palpated and found to have minimal calcifications vein went close to the sinotubular junction.  The vein was harvested and pursestrings were placed in the ascending aorta  ?and right atrium and heparin was administered.  When the ACT was therapeutic, the patient was cannulated and placed on cardiopulmonary bypass.  The distal coronary arteries were examined.  The main right coronary was heavily calcified down to the  ?bifurcation and the posterior descending was inadequate, but small target.  The circumflex vessels were small, but the first marginal was the largest of the vessels approximately 1.2 mm.  The LAD was unusual with multiple diagonal branches fairly high on ? the septum and the second diagonal branch of the LAD was felt to be the largest vessel in the territory of the LAD and was the target vessel for the mammary artery. ? ?Cardioplegia cannulas were placed within the antegrade and retrograde cold blood cardioplegia and the patient was cooled to 32 degrees.  The aortic Crossclamp was applied and 1 liter of cold blood cardioplegia was delivered in split doses between the  ?antegrade aortic and retrograde coronary sinus catheters.  There was good cardioplegic arrest and supple temperature dropped less than 14 degrees.  Cardioplegia was delivered every 20 minutes while the crossclamp was then placed. ? ?The distal coronary anastomoses were performed.  The first distal anastomosis was to the posterior descending branch of right coronary.  There was a 1.5 mm vessel with proximal 80% stenosis.  A reverse saphenous vein was sewn end-to-side with running 7-0 ? Prolene with good flow through the graft. ? ?The second distal anastomosis was to the OM1.  This was a small 1 mm vessel with proximal 80-90% left main stenosis.  A reverse saphenous vein was sewn end-to-side with running 8-0 Prolene.  There was satisfactory flow through the graft.  Cardioplegia  ?was redosed. ? ?The third distal  anastomosis was to the second diagonal branch of the LAD, which had an early branching pattern.  The left IMA pedicle was brought through an opening in the pericardium was brought down onto the LAD and sewn end-to-side with running 8-0  ?Prolene.  There was good flow through the anastomosis after briefly releasing the pedicle bulldog and the mammary artery.  The bulldog was reapplied and the pedicle secured to epicardium with 6-0 Prolenes.  Cardioplegia was redosed. ? ?While the cross-clamp was still in place, 2 proximal vein anastomoses were performed on the ascending aorta using a 4.5 mm punch and running 6-0 Prolene.  Prior to tying down the final proximal anastomosis, a dose of retrograde warm blood cardioplegia  ?was used to remove any retained air in the left side of the heart in the coronary vessels.  The crossclamp was then removed. ? ?The heart resumed a spontaneous rhythm.  The vein grafts were de-aired and opened and each had good flow and hemostasis was documented at the proximal and distal anastomoses.  The patient was rewarmed and reperfused, temporary pacing wires were applied.  ? The lungs were expanded.  Ventilator was resumed.  The patient was weaned from cardiopulmonary bypass without difficulty, requiring minimal inotropic support with milrinone.  Echo showed preserved LV function globally.  The protamine was administered  ?without adverse reaction.  The cannulas were removed.  The mediastinum was irrigated.  The superior pericardial fat was closed over the aorta and vein grafts.  Anterior mediastinal and left pleural chest tubes  were placed and brought out through separate ? incisions.  The sternum was closed with wire.  The patient remained stable.  The pectoralis fascia was closed with a running #1 Vicryl and subcutaneous and skin layers were closed with a running Vicryl.  Total cardiopulmonary bypass time was 135  ?minutes. ? ? ?PUS ?D: 04/06/2021 11:48:11 am T: 04/06/2021 3:26:00 pm  ?JOB:  [id number]/ [id number]  ?

## 2021-04-06 NOTE — Progress Notes (Signed)
T CTS PM rounds ? ?Patient's pain control improved after resuming her home pain medicine regimen ?Sinus rhythm weaned off inotropes except for low-dose milrinone ?Good urine output with Lasix diuresis ?Up in chair and chest tubes removed ?P.m. labs reviewed and are satisfactory ?Blood pressure 114/74, pulse 94, temperature 99.7 ?F (37.6 ?C), resp. rate (!) 0, height '5\' 3"'$  (1.6 m), weight 62.3 kg, SpO2 97 %.  ?

## 2021-04-06 NOTE — Anesthesia Postprocedure Evaluation (Signed)
Anesthesia Post Note ? ?Patient: Samantha Osborne ? ?Procedure(s) Performed: CORONARY ARTERY BYPASS GRAFTING (CABG) x3, Using Left Internal Mammory Artery and Right Leg greater saphenous vein harvested endoscopically (Chest) ?TRANSESOPHAGEAL ECHOCARDIOGRAM (TEE) ? ?  ? ?Patient location during evaluation: SICU ?Anesthesia Type: General ?Level of consciousness: sedated ?Pain management: pain level controlled ?Vital Signs Assessment: post-procedure vital signs reviewed and stable ?Respiratory status: patient remains intubated per anesthesia plan ?Cardiovascular status: stable ?Postop Assessment: no apparent nausea or vomiting ?Anesthetic complications: no ? ? ?No notable events documented. ? ?Last Vitals:  ?Vitals:  ? 04/06/21 0923 04/06/21 1000  ?BP: 133/76 132/74  ?Pulse: (!) 105 (!) 101  ?Resp:    ?Temp: 37.6 ?C 37.5 ?C  ?SpO2: 93% 95%  ?  ?Last Pain:  ?Vitals:  ? 04/06/21 0953  ?TempSrc:   ?PainSc: 1   ? ? ?  ?  ?  ?  ?  ?  ? ?[place] ? ? ? ? ?

## 2021-04-06 NOTE — Progress Notes (Addendum)
? ?   ?[address] ?      [place] [address] ?            [phone]   ? ?  ?1 Day Post-Op Procedure(s) (LRB): ?CORONARY ARTERY BYPASS GRAFTING (CABG) x3, Using Left Internal Mammory Artery and Right Leg greater saphenous vein harvested endoscopically (N/A) ?TRANSESOPHAGEAL ECHOCARDIOGRAM (TEE) (N/A) ? ?Subjective: ? ?Patient not doing well this morning.  She is nauseated with vomiting.  Having a lot of pain in her back.  She is on chronic pain medication at home.   ? ?Objective: ?Vital signs in last 24 hours: ?Temp:  [96.1 ?F (35.6 ?C)-99.3 ?F (37.4 ?C)] 99 ?F (37.2 ?C) (03/16 0700) ?Pulse Rate:  [74-104] 99 (03/16 0700) ?Cardiac Rhythm: Normal sinus rhythm (03/16 0600) ?Resp:  [0-23] 0 (03/15 1700) ?BP: (97-134)/(47-76) 134/76 (03/16 0700) ?SpO2:  [93 %-100 %] 95 % (03/16 0700) ?Arterial Line BP: (93-143)/(50-75) 132/70 (03/16 0700) ?FiO2 (%):  [40 %-50 %] 40 % (03/15 2122) ?Weight:  [62.3 kg] 62.3 kg (03/16 0500) ? ?Hemodynamic parameters for last 24 hours: ?PAP: (15-43)/(2-25) 35/24 ?CVP:  [8 mmHg-15 mmHg] 15 mmHg ?CO:  [3.6 L/min-5.4 L/min] 4.2 L/min ?CI:  [2.3 L/min/m2-3.4 L/min/m2] 2.3 L/min/m2 ? ?Intake/Output from previous day: ?03/15 0701 - 03/16 0700 ?In: 6507.8 [I.V.:4976; Blood:358; IV Piggyback:1173.9] ?Out: 2671 [Urine:3480; Blood:671; Chest Tube:380] ? ?General appearance: alert and mild distress ?Heart: regular rate and rhythm ?Lungs: clear to auscultation bilaterally ?Abdomen: soft, non-tender; bowel sounds normal; no masses,  no organomegaly ?Extremities: edema trace ?Wound: clean and dry ? ?Lab Results: ?Recent Labs  ?  04/05/21 ?2458 04/05/21 ?1851 04/06/21 ?0026 04/06/21 ?0435  ?WBC 29.3*  --  19.6*  --   ?HGB 11.1*   < > 9.9* 10.2*  ?HCT 33.8*   < > 29.7* 30.0*  ?PLT 137*  --  131*  --   ? < > = values in this interval not displayed.  ? ?BMET:  ?Recent Labs  ?  04/05/21 ?0429 04/05/21 ?1000 04/05/21 ?1446 04/05/21 ?1449 04/06/21 ?0026 04/06/21 ?0435  ?NA 139   < > 135   <  > 135 136  ?K 4.3   < > 4.9   < > 4.0 4.1  ?CL 106   < > 99  --  103  --   ?CO2 25  --   --   --  22  --   ?GLUCOSE 115*   < > 186*  --  178*  --   ?BUN 19   < > 16  --  14  --   ?CREATININE 1.50*   < > 1.00  --  1.11*  --   ?CALCIUM 9.0  --   --   --  7.1*  --   ? < > = values in this interval not displayed.  ?  ?PT/INR:  ?Recent Labs  ?  04/05/21 ?1631  ?LABPROT 17.1*  ?INR 1.4*  ? ?ABG ?   ?Component Value Date/Time  ? PHART 7.355 04/06/2021 0435  ? HCO3 22.8 04/06/2021 0435  ? TCO2 24 04/06/2021 0435  ? ACIDBASEDEF 3.0 (H) 04/06/2021 0435  ? O2SAT 97 04/06/2021 0435  ? ?CBG (last 3)  ?Recent Labs  ?  04/06/21 ?0338 04/06/21 ?0445 04/06/21 ?0628  ?GLUCAP 114* 78 120*  ? ? ?Assessment/Plan: ?S/P Procedure(s) (LRB): ?CORONARY ARTERY BYPASS GRAFTING (CABG) x3, Using Left Internal Mammory Artery and Right Leg greater saphenous vein harvested endoscopically (N/A) ?TRANSESOPHAGEAL ECHOCARDIOGRAM (TEE) (N/A) ? ?CV-  NSR- on Dopamine at 3, Milrinone at 0.25, and Neo at 40- wean drips as hemodynamics allow.Marland Kitchen ?Pulm- CT output 380 cc since surgery, no air leak present, will discuss chest tube removal with Dr. Prescott Gum ?Renal- creatinine at 1.11, will monitor.. is volume overloaded on exam, will need Lasix once off drips ?Chronic Pain- patient takes high dose Oxycodone and Norco at home, regimen has been resumed  ?GI- N/V.. will add reglan x 4 doses, and compazine prn for additional nausea relief.. will leave on liquid diet today ?Expected post operative blood loss anemia- hgb stable at 10.2 ?CBGs- controlled, patient is not a diabetic will stop insulin drip and transition to SSIP ?Dispo- patient stable, POD #1 progression orders, wean drips as hemodynamics allow, N/V a problem anti-emetic has been adjusted.. patient has chronic pain on high dose narcotics at home, have ordered home regimen ? ? LOS: 5 days  ? ? ?Ellwood Handler, PA-C ?04/06/2021 ? ?patient examined and medical record reviewed,agree with above note. ?Dahlia Byes ?04/06/2021 ?Cardiac status stable but issues now are pain control, nausea, high O2 requirement - will wean pressors, cont milrinone and mobilize OOB then DC drains if output remains low, start lasix ?

## 2021-04-06 NOTE — Discharge Summary (Addendum)
? ?   ?[address] ?      [place] [address] ?            [phone]   ? ?Physician Discharge Summary  ?Patient ID: ?Samantha Osborne ?MRN: [id number] ?DOB/AGE: 07-18-1955 66 y.o. ? ?Admit date: 04/01/2021 ?Discharge date: 04/10/2021 ? ?Admission Diagnoses: ? ?Patient Active Problem List  ? Diagnosis Date Noted  ? Elevated troponin - acute coronary syndrome 04/02/2021  ? Essential hypertension 04/02/2021  ? Hyperlipidemia 04/02/2021  ? ILD (interstitial lung disease) ([place]) 09/10/2018  ? Rheumatoid arthritis ([place]) 09/10/2018  ? Atrophic vaginitis 09/06/2016  ? Dyspareunia in female 09/06/2016  ? Encounter for screening for human papillomavirus (HPV) 09/06/2016  ? 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 Menopausal state 09/06/2016  ? Exertional chest pain 03/15/2015  ? Atypical chest pain 03/15/2015  ? GERD (gastroesophageal reflux disease) 03/15/2015  ? 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 Carotid stenosis 10/27/2013  ? ?Discharge Diagnoses:  ?Patient Active Problem List  ? Diagnosis Date Noted  ? S/P CABG x 3 04/05/2021  ? Elevated troponin 04/02/2021  ? Essential hypertension 04/02/2021  ? Hyperlipidemia 04/02/2021  ? ILD (interstitial lung disease) ([place]) 09/10/2018  ? Rheumatoid arthritis ([place]) 09/10/2018  ? Atrophic vaginitis 09/06/2016  ? Dyspareunia in female 09/06/2016  ? Encounter for screening for human papillomavirus (HPV) 09/06/2016  ? 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 Menopausal state 09/06/2016  ? Exertional chest pain 03/15/2015  ? Atypical chest pain 03/15/2015  ? GERD (gastroesophageal reflux disease) 03/15/2015  ? 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 Carotid stenosis 10/27/2013  ? Acute coronary syndrome ? ?Discharged Condition: good ? ?Referring: Dr. Irish Lack, MD ?Primary Care: Redmond School, MD ?Primary Cardiologist:Branch, Roderic Palau, MD ? ?(Follow up appts requested on 3/15) ? ? 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 Patient describes chest burning and aching, pain in both sides of neck, and radiating to left arm. She sometimes has shortness of breath with episodes but denies nausea or LE edema. She has been having night sweats for a few months, which she does not think is related . Of note, this chest pain has been occurring for over one month and she ws scheduled for a coronary CT this week by Dr. Harl Bowie. EKG showed sinu rhythm, slight ST depression in Leads I and AVL. Troponin I (high sensitivity) initially was 21 and went up to 51.  ?She was transferred to [facility] for further evaluation and treatment.  ?Chest x ray showed chronic interstitial opacities. ?Echo showed LVEF 60-65%, no significant valvular abnormalities (trivial MR), and no pericardial effusion. Cardiac catheterization done today showed distal left main stenosis 90%, ostial Circumflex with a 75% stenosis, and mid to distal RCA 75% stenosis. A cardiothoracic consultation was requested with Dr. Prescott Gum for the consideration of coronary revascularization. At the time of my exam, patient is in no acute distress, BP is 129/76, HR 90, oxygenation is 100% on room air. She denies chest pain, pressure, or shortness of breath. Her husband is at the bedside. ?  ?Hospital Course: ? ?Coronary bypass grafting was offered to Ms. Goding by Dr. Prescott Gum and she decided to precede with surgery. She remained stable after the left heart  cath.  She was taken to the OR on 04/05/21 where CABG x 3 was carried out without complication.  The left internal mammary artery was grafted to the left anterior descending coronary artery. Separate saphenous vein grafts were placed to the obtuse marginal and posterior descending coronary  arteries.  Following the procedure she separated from cardiopulmonary bypass without difficulty and was transferred to the surgical ICU in stable condition.  The patient was weaned and extubated the evening of surgery.  The patient required pressor support with Dopamine, Milrinone, and Neo-synephrine.  These were weaned as hemodynamics allowed.  Patient had issues with pain control.  She is on narcotics chronically at home and her home regimen was resumed.  Due to her chronic use prior to admission she will not be provided a prescription for these medications at discharge.  She had issues with nausea and vomiting.  She was treated with reglan and compazine in addition to zofran with relief of symptoms.  The patient was hypertensive and her home Norvasc was restarted.  The patient was tachycardic and was started on a beta blocker once Milrinone was discontinued.  The patient developed Atrial Fibrillation and was treated with IV Amiodarone bolus and drip therapy.  She converted to NSR without difficulty.  She was later transitioned to an oral regimen and Lopressor was increased. She had thrombocytopenia post op but last platelet count on 03/19 was up to 149,000. She had expected post op blood loss anemia and last H and H was stable at 9.2 and 27.1. She was felt surgically stable for transfer from the ICU to 4E on 04/08/2021. She is ambulating with good oxygenation on room air. She has been tolerating a diet and has had a bowel movement. All wounds are clean, dry, and healing without signs of infection. Epicardial pacing wires were removed on 03/19. Creatinine was slightly increased from 1.3 to 1.6 on 03/19 so Lasix was held.  Her creatinine level improved to 1.50.  She remains tachycardic, her norvasc was discontinued and her Lopressor dose was titrated.  She has moved her bowels.  Her surgical incisions are healing without evidence of infection.  She is medically stable for discharge home today. ? ?Consults:  None ? ?Significant Diagnostic Studies: angiography:  ? ?  Dist LM lesion is 90% stenosed. ?  Mid RCA to Dist RCA lesion is 75% stenosed. ?  Prox RCA to Mid RCA lesion is 50% stenosed. ?  Ost Cx lesion is 75% stenosed.  Circumflex is small.  RIght to left collaterals to the distal circumflex. ?  Prox LAD to Mid LAD lesion is 25% stenosed. ?  LV end diastolic pressure is normal. ?  There is no aortic valve stenosis. ?  ?Severe three vessel coroanry artery disease.   ?  ?Cardiac surgery consult.  ?  ?Would restart heparin IV 8 hours post sheath pull.  ? ?Treatments: surgery:  ? ?Operative Report  ?  ?DATE OF PROCEDURE: 04/06/2021 ?  ?PROCEDURES PERFORMED: ?1.  Coronary artery bypass grafting x3 (left internal mammary artery to LAD--diagonal bifurcation, saphenous vein graft to OM1, saphenous vein graft to posterior descending). ?2.  Endoscopic harvest of right leg greater saphenous vein. ?  ?PREOPERATIVE DIAGNOSIS:  Severe 3-vessel coronary artery disease with acute coronary syndrome, chronic lung disease with active smoking, chronic pain syndrome. ?  ?POSTOPERATIVE DIAGNOSIS:  Severe 3-vessel coronary artery disease with acute coronary syndrome, chronic lung disease with active smoking, chronic pain syndrome. ?  ?SURGEON:  Tharon Aquas Trigt III,  MD ?  ?ASSISTANT:  Enid Cutter, PA-C.  A surgical first assistant was required for the surgery due to the complexity of the procedure and the standard of care for cardiac surgery.  The first assistant was required for harvesting the saphenous vein  ?endoscopically and closing the leg incisions, assisting the surgeon with the distal anastomoses, and suture management, providing exposure, suctioning and retraction and general assistance. ? ? ?Discharge Exam: ?Blood pressure 98/60, pulse 92, temperature 98.9 ?F (37.2 ?C), temperature source Oral, resp. rate 20, height '5\' 3"'$  (1.6 m), weight 61.7 kg, SpO2 94 %. ? ?General appearance: alert, cooperative, and no distress ?Heart:  regular rate and rhythm and tachycardia ?Lungs: clear to auscultation bilaterally ?Abdomen: soft, non-tender; bowel sounds normal; no masses,  no organomegaly ?Extremities: edema none present ?Wound: clean and d

## 2021-04-07 ENCOUNTER — Inpatient Hospital Stay (HOSPITAL_COMMUNITY): Payer: Medicare Other

## 2021-04-07 ENCOUNTER — Encounter (HOSPITAL_COMMUNITY): Payer: Self-pay | Admitting: Cardiothoracic Surgery

## 2021-04-07 LAB — BASIC METABOLIC PANEL
Anion gap: 7 (ref 5–15)
BUN: 16 mg/dL (ref 8–23)
CO2: 26 mmol/L (ref 22–32)
Calcium: 8.4 mg/dL — ABNORMAL LOW (ref 8.9–10.3)
Chloride: 97 mmol/L — ABNORMAL LOW (ref 98–111)
Creatinine, Ser: 1.34 mg/dL — ABNORMAL HIGH (ref 0.44–1.00)
GFR, Estimated: 43 mL/min — ABNORMAL LOW (ref >60)
Glucose, Bld: 129 mg/dL — ABNORMAL HIGH (ref 70–99)
Potassium: 4.3 mmol/L (ref 3.5–5.1)
Sodium: 130 mmol/L — ABNORMAL LOW (ref 135–145)

## 2021-04-07 LAB — CBC
HCT: 29.5 % — ABNORMAL LOW (ref 36.0–46.0)
Hemoglobin: 10.3 g/dL — ABNORMAL LOW (ref 12.0–15.0)
MCH: 32.3 pg (ref 26.0–34.0)
MCHC: 34.9 g/dL (ref 30.0–36.0)
MCV: 92.5 fL (ref 80.0–100.0)
Platelets: UNDETERMINED 10*3/uL (ref 150–400)
RBC: 3.19 MIL/uL — ABNORMAL LOW (ref 3.87–5.11)
RDW: 15.9 % — ABNORMAL HIGH (ref 11.5–15.5)
WBC: 18 10*3/uL — ABNORMAL HIGH (ref 4.0–10.5)
nRBC: 0 % (ref 0.0–0.2)

## 2021-04-07 LAB — GLUCOSE, CAPILLARY
Glucose-Capillary: 122 mg/dL — ABNORMAL HIGH (ref 70–99)
Glucose-Capillary: 135 mg/dL — ABNORMAL HIGH (ref 70–99)
Glucose-Capillary: 140 mg/dL — ABNORMAL HIGH (ref 70–99)
Glucose-Capillary: 142 mg/dL — ABNORMAL HIGH (ref 70–99)
Glucose-Capillary: 147 mg/dL — ABNORMAL HIGH (ref 70–99)
Glucose-Capillary: 164 mg/dL — ABNORMAL HIGH (ref 70–99)

## 2021-04-07 MED ORDER — GUAIFENESIN ER 600 MG PO TB12
600.0000 mg | ORAL_TABLET | Freq: Two times a day (BID) | ORAL | Status: DC
Start: 2021-04-07 — End: 2021-04-10
  Administered 2021-04-07 – 2021-04-10 (×7): 600 mg via ORAL
  Filled 2021-04-07 (×7): qty 1

## 2021-04-07 MED ORDER — AMIODARONE HCL IN DEXTROSE 360-4.14 MG/200ML-% IV SOLN
60.0000 mg/h | INTRAVENOUS | Status: AC
  Administered 2021-04-07 (×2): 60 mg/h via INTRAVENOUS
  Filled 2021-04-07: qty 400

## 2021-04-07 MED ORDER — FUROSEMIDE 10 MG/ML IJ SOLN
40.0000 mg | Freq: Every day | INTRAMUSCULAR | Status: DC
Start: 2021-04-08 — End: 2021-04-08

## 2021-04-07 MED ORDER — FENTANYL CITRATE PF 50 MCG/ML IJ SOSY
50.0000 ug | PREFILLED_SYRINGE | INTRAMUSCULAR | Status: DC | PRN
Start: 2021-04-07 — End: 2021-04-10
  Administered 2021-04-07 (×2): 50 ug via INTRAVENOUS
  Filled 2021-04-07 (×2): qty 1

## 2021-04-07 MED ORDER — MILRINONE LACTATE IN DEXTROSE 20-5 MG/100ML-% IV SOLN
0.1250 ug/kg/min | INTRAVENOUS | Status: DC
  Administered 2021-04-07: 0.125 ug/kg/min via INTRAVENOUS

## 2021-04-07 MED ORDER — INSULIN ASPART 100 UNIT/ML IJ SOLN
0.0000 [IU] | Freq: Three times a day (TID) | INTRAMUSCULAR | Status: DC
  Administered 2021-04-07: 2 [IU] via SUBCUTANEOUS
  Administered 2021-04-07: 4 [IU] via SUBCUTANEOUS
  Administered 2021-04-08: 2 [IU] via SUBCUTANEOUS

## 2021-04-07 MED ORDER — AMIODARONE HCL IN DEXTROSE 360-4.14 MG/200ML-% IV SOLN
30.0000 mg/h | INTRAVENOUS | Status: AC
  Filled 2021-04-07: qty 200

## 2021-04-07 MED ORDER — AMLODIPINE BESYLATE 5 MG PO TABS
5.0000 mg | ORAL_TABLET | Freq: Every day | ORAL | Status: DC
  Administered 2021-04-07 – 2021-04-10 (×4): 5 mg via ORAL
  Filled 2021-04-07 (×4): qty 1

## 2021-04-07 MED ORDER — AMIODARONE LOAD VIA INFUSION
150.0000 mg | Freq: Once | INTRAVENOUS | Status: AC
  Administered 2021-04-07: 150 mg via INTRAVENOUS
  Filled 2021-04-07: qty 83.34

## 2021-04-07 MED FILL — Potassium Chloride Inj 2 mEq/ML: INTRAVENOUS | Qty: 40 | Status: AC

## 2021-04-07 MED FILL — Sodium Bicarbonate IV Soln 8.4%: INTRAVENOUS | Qty: 50 | Status: AC

## 2021-04-07 MED FILL — Heparin Sodium (Porcine) Inj 1000 Unit/ML: INTRAMUSCULAR | Qty: 10 | Status: AC

## 2021-04-07 MED FILL — Mannitol IV Soln 20%: INTRAVENOUS | Qty: 500 | Status: AC

## 2021-04-07 MED FILL — Magnesium Sulfate Inj 50%: INTRAMUSCULAR | Qty: 2 | Status: AC

## 2021-04-07 MED FILL — Heparin Sodium (Porcine) Inj 1000 Unit/ML: Qty: 1000 | Status: AC

## 2021-04-07 MED FILL — Albumin, Human Inj 5%: INTRAVENOUS | Qty: 250 | Status: AC

## 2021-04-07 MED FILL — Sodium Chloride IV Soln 0.9%: INTRAVENOUS | Qty: 3000 | Status: AC

## 2021-04-07 MED FILL — Lidocaine HCl (Cardiac) IV PF Soln 100 MG/5ML (2%): INTRAVENOUS | Qty: 5 | Status: AC

## 2021-04-07 MED FILL — Electrolyte-R (PH 7.4) Solution: INTRAVENOUS | Qty: 4000 | Status: AC

## 2021-04-07 NOTE — Progress Notes (Addendum)
? ?   ?[address] ?      [place] [address] ?            [phone]   ? ?  ?2 Days Post-Op Procedure(s) (LRB): ?CORONARY ARTERY BYPASS GRAFTING (CABG) x3, Using Left Internal Mammory Artery and Right Leg greater saphenous vein harvested endoscopically (N/A) ?TRANSESOPHAGEAL ECHOCARDIOGRAM (TEE) (N/A) ? ?Subjective: ? ?Patient up in chair, doing better this morning.  Denies N/V.  Her pain is also controlled on home regimen.  She has not yet ambulated or moved her bowels ? ?Objective: ?Vital signs in last 24 hours: ?Temp:  [99 ?F (37.2 ?C)-99.9 ?F (37.7 ?C)] 99.6 ?F (37.6 ?C) (03/17 0400) ?Pulse Rate:  [86-105] 99 (03/17 0500) ?Cardiac Rhythm: Normal sinus rhythm (03/17 0200) ?BP: (114-143)/(68-84) 136/79 (03/17 0500) ?SpO2:  [89 %-99 %] 98 % (03/17 0500) ?Arterial Line BP: (135-176)/(62-84) 142/73 (03/16 1300) ?Weight:  [62.3 kg] 62.3 kg (03/17 0500) ? ?Hemodynamic parameters for last 24 hours: ?PAP: (24-38)/(12-24) 28/16 ?CVP:  [4 mmHg-16 mmHg] 10 mmHg ?CO:  [3.4 L/min-4.5 L/min] 3.4 L/min ?CI:  [2.1 L/min/m2-2.8 L/min/m2] 2.1 L/min/m2 ? ?Intake/Output from previous day: ?03/16 0701 - 03/17 0700 ?In: 655.3 [I.V.:455.3; IV Piggyback:200] ?Out: 2615 [Urine:2475; Chest Tube:140] ? ?General appearance: alert, cooperative, and no distress ?Heart: regular rate and rhythm ?Lungs: clear to auscultation bilaterally ?Abdomen: soft, non-tender; bowel sounds normal; no masses,  no organomegaly ?Extremities: edema none present ?Wound: aquacel on sternum, EVH site C/D/I ? ?Lab Results: ?Recent Labs  ?  04/06/21 ?1700 04/07/21 ?0435  ?WBC 20.8* 18.0*  ?HGB 10.6* 10.3*  ?HCT 31.2* 29.5*  ?PLT 125* PLATELET CLUMPS NOTED ON SMEAR, UNABLE TO ESTIMATE  ? ?BMET:  ?Recent Labs  ?  04/06/21 ?1700 04/07/21 ?0435  ?NA 133* 130*  ?K 4.6 4.3  ?CL 99 97*  ?CO2 24 26  ?GLUCOSE 128* 129*  ?BUN 16 16  ?CREATININE 1.38* 1.34*  ?CALCIUM 8.3* 8.4*  ?  ?PT/INR:  ?Recent Labs  ?  04/05/21 ?1631  ?LABPROT 17.1*  ?INR 1.4*   ? ?ABG ?   ?Component Value Date/Time  ? PHART 7.355 04/06/2021 0435  ? HCO3 22.8 04/06/2021 0435  ? TCO2 24 04/06/2021 0435  ? ACIDBASEDEF 3.0 (H) 04/06/2021 0435  ? O2SAT 97 04/06/2021 0435  ? ?CBG (last 3)  ?Recent Labs  ?  04/06/21 ?1950 04/07/21 ?2353 04/07/21 ?0417  ?GLUCAP 131* 140* 135*  ? ? ?Assessment/Plan: ?S/P Procedure(s) (LRB): ?CORONARY ARTERY BYPASS GRAFTING (CABG) x3, Using Left Internal Mammory Artery and Right Leg greater saphenous vein harvested endoscopically (N/A) ?TRANSESOPHAGEAL ECHOCARDIOGRAM (TEE) (N/A) ? ?CV- Sinus Tachycardia, HTN- remains on Milrinone at 0.25, may be able to cut dose in half vs. Discontinue.. will resume Norvasc today at 5 mg daily ?Pulm- CTs are out, wean oxygen as tolerated, CXR w/o pneumothorax, + atelectasis, continue IS ?Renal- creatinine stable at 1.38, weight is computer states patient is up about 12 lbs since admission, no LE edema present on exam.. on Lasix, potassium ?D/C Foley Catheter ?Expected post operative blood loss anemia Hgb at 10.3 ?Pain control- achieved with home narcotic regimen ?GI- N/V improved, continue anti-emetics ?Dispo- patient stable, sinus tach will titrate BB once off Milrinone, resume home Norvasc for HTN, aggressive pulmonary toilet with underlying ILD and atelectasis on CXR, d/c foley catheter, to 4E once off all drips ? ? LOS: 6 days  ? ? ?Ellwood Handler, PA-C ?04/07/2021 ?Agree with assessment and plan as documented above by Ms. [name]. ? ?Patient developed  rapid atrial fibrillation after mobilization. ?With IV amiodarone protocol she has converted back to sinus rhythm 82 bpm with stable blood pressure ?Milrinone weaned to 0.125.  Chest x-ray still with some interstitial edema on the left side after removal of chest tubes ? ?Continue mobilization, diuresis, O2 wean and pulmonary physiotherapy.  We will watch elevated white count-due to patient's preoperative smoking history and lung disease he is at risk for pneumonia. ? ?Will need  plavix at discharge since she presented with ACS ?

## 2021-04-07 NOTE — Discharge Instructions (Signed)

## 2021-04-07 NOTE — Progress Notes (Signed)
? ?   ?  [address] ?      [place] [address] ?            [phone]   ? ?  ?POD # 2 ? ?Sleeping currently ?Ambulated earlier ?Pain control remains as issue ? ?BP (!) 102/59   Pulse 77   Temp 98.3 ?F (36.8 ?C) (Oral)   Resp 16   Ht '5\' 3"'$  (1.6 m)   Wt 62.3 kg   SpO2 95%   BMI 24.33 kg/m?  ? ?Intake/Output Summary (Last 24 hours) at 04/07/2021 1702 ?Last data filed at 04/07/2021 1700 ?Gross per 24 hour  ?Intake 1222.39 ml  ?Output 2640 ml  ?Net -1417.61 ml  ? ?CBG well controlled ? ?Revonda Standard Roxan Hockey, MD ?[facility] ?[phone] ? ?

## 2021-04-08 ENCOUNTER — Inpatient Hospital Stay (HOSPITAL_COMMUNITY): Payer: Medicare Other

## 2021-04-08 LAB — BPAM RBC
Blood Product Expiration Date: 202304012359
Blood Product Expiration Date: 202304012359
Blood Product Expiration Date: 202304012359
Blood Product Expiration Date: 202304012359
Blood Product Expiration Date: 202304042359
Blood Product Expiration Date: 202304042359
ISSUE DATE / TIME: 202303151210
ISSUE DATE / TIME: 202303151210
ISSUE DATE / TIME: 202303151241
ISSUE DATE / TIME: 202303151241
Unit Type and Rh: 6200
Unit Type and Rh: 6200
Unit Type and Rh: 6200
Unit Type and Rh: 6200
Unit Type and Rh: 6200
Unit Type and Rh: 6200

## 2021-04-08 LAB — TYPE AND SCREEN
ABO/RH(D): A POS
Antibody Screen: NEGATIVE
Unit division: 0
Unit division: 0
Unit division: 0
Unit division: 0
Unit division: 0
Unit division: 0

## 2021-04-08 LAB — CBC
HCT: 29.1 % — ABNORMAL LOW (ref 36.0–46.0)
Hemoglobin: 9.8 g/dL — ABNORMAL LOW (ref 12.0–15.0)
MCH: 31.1 pg (ref 26.0–34.0)
MCHC: 33.7 g/dL (ref 30.0–36.0)
MCV: 92.4 fL (ref 80.0–100.0)
Platelets: 128 10*3/uL — ABNORMAL LOW (ref 150–400)
RBC: 3.15 MIL/uL — ABNORMAL LOW (ref 3.87–5.11)
RDW: 15.3 % (ref 11.5–15.5)
WBC: 16.7 10*3/uL — ABNORMAL HIGH (ref 4.0–10.5)
nRBC: 0 % (ref 0.0–0.2)

## 2021-04-08 LAB — BASIC METABOLIC PANEL
Anion gap: 8 (ref 5–15)
BUN: 16 mg/dL (ref 8–23)
CO2: 27 mmol/L (ref 22–32)
Calcium: 8.3 mg/dL — ABNORMAL LOW (ref 8.9–10.3)
Chloride: 96 mmol/L — ABNORMAL LOW (ref 98–111)
Creatinine, Ser: 1.3 mg/dL — ABNORMAL HIGH (ref 0.44–1.00)
GFR, Estimated: 45 mL/min — ABNORMAL LOW (ref >60)
Glucose, Bld: 117 mg/dL — ABNORMAL HIGH (ref 70–99)
Potassium: 3.8 mmol/L (ref 3.5–5.1)
Sodium: 131 mmol/L — ABNORMAL LOW (ref 135–145)

## 2021-04-08 LAB — GLUCOSE, CAPILLARY
Glucose-Capillary: 155 mg/dL — ABNORMAL HIGH (ref 70–99)
Glucose-Capillary: 140 mg/dL — ABNORMAL HIGH (ref 70–99)
Glucose-Capillary: 138 mg/dL — ABNORMAL HIGH (ref 70–99)
Glucose-Capillary: 148 mg/dL — ABNORMAL HIGH (ref 70–99)

## 2021-04-08 MED ORDER — METOPROLOL TARTRATE 25 MG PO TABS
25.0000 mg | ORAL_TABLET | Freq: Two times a day (BID) | ORAL | Status: DC
Start: 2021-04-08 — End: 2021-04-10
  Administered 2021-04-08 – 2021-04-10 (×5): 25 mg via ORAL
  Filled 2021-04-08 (×5): qty 1

## 2021-04-08 MED ORDER — MAGNESIUM HYDROXIDE 400 MG/5ML PO SUSP: 30.0000 mL | Freq: Every day | ORAL | Status: DC | PRN

## 2021-04-08 MED ORDER — AMIODARONE HCL 200 MG PO TABS
400.0000 mg | ORAL_TABLET | Freq: Two times a day (BID) | ORAL | Status: DC
  Administered 2021-04-08 – 2021-04-09 (×4): 400 mg via ORAL
  Filled 2021-04-08 (×5): qty 2

## 2021-04-08 MED ORDER — BUTALBITAL-APAP-CAFFEINE 50-325-40 MG PO TABS: 1.0000 | ORAL_TABLET | ORAL | Status: DC | PRN

## 2021-04-08 MED ORDER — ~~LOC~~ CARDIAC SURGERY, PATIENT & FAMILY EDUCATION: Freq: Once | Status: AC

## 2021-04-08 MED ORDER — SODIUM CHLORIDE 0.9% FLUSH
3.0000 mL | Freq: Two times a day (BID) | INTRAVENOUS | Status: DC
  Administered 2021-04-08 – 2021-04-10 (×4): 3 mL via INTRAVENOUS

## 2021-04-08 MED ORDER — TRAZODONE HCL 50 MG PO TABS
50.0000 mg | ORAL_TABLET | Freq: Every day | ORAL | Status: DC
  Administered 2021-04-08 – 2021-04-09 (×2): 50 mg via ORAL
  Filled 2021-04-08 (×2): qty 1

## 2021-04-08 MED ORDER — FUROSEMIDE 40 MG PO TABS
40.0000 mg | ORAL_TABLET | Freq: Every day | ORAL | Status: DC
  Administered 2021-04-08: 40 mg via ORAL
  Filled 2021-04-08: qty 1

## 2021-04-08 MED ORDER — SODIUM CHLORIDE 0.9% FLUSH
3.0000 mL | INTRAVENOUS | Status: DC | PRN
  Administered 2021-04-08: 3 mL via INTRAVENOUS

## 2021-04-08 MED ORDER — SODIUM CHLORIDE 0.9 % IV SOLN: 250.0000 mL | INTRAVENOUS | Status: DC | PRN

## 2021-04-08 MED ORDER — METOPROLOL TARTRATE 25 MG/10 ML ORAL SUSPENSION: 25.0000 mg | Freq: Two times a day (BID) | ORAL | Status: DC

## 2021-04-08 MED ORDER — POTASSIUM CHLORIDE 20 MEQ PO PACK: 20.0000 meq | PACK | Freq: Two times a day (BID) | ORAL | Status: DC

## 2021-04-08 MED ORDER — POTASSIUM CHLORIDE CRYS ER 20 MEQ PO TBCR
20.0000 meq | EXTENDED_RELEASE_TABLET | Freq: Two times a day (BID) | ORAL | Status: DC
  Administered 2021-04-08 – 2021-04-10 (×5): 20 meq via ORAL
  Filled 2021-04-08 (×5): qty 1

## 2021-04-08 NOTE — Progress Notes (Signed)
3 Days Post-Op Procedure(s) (LRB): ?CORONARY ARTERY BYPASS GRAFTING (CABG) x3, Using Left Internal Mammory Artery and Right Leg greater saphenous vein harvested endoscopically (N/A) ?TRANSESOPHAGEAL ECHOCARDIOGRAM (TEE) (N/A) ?Subjective: ?C/o back pain ? ? ?Objective: ?Vital signs in last 24 hours: ?Temp:  [97.9 ?F (36.6 ?C)-98.3 ?F (36.8 ?C)] 98.3 ?F (36.8 ?C) (03/18 0800) ?Pulse Rate:  [69-109] 99 (03/18 0800) ?Cardiac Rhythm: Sinus tachycardia;Normal sinus rhythm (03/18 0800) ?Resp:  [16] 16 (03/17 1200) ?BP: (87-140)/(54-113) 107/63 (03/18 0800) ?SpO2:  [92 %-98 %] 96 % (03/18 0800) ?Weight:  [62 kg] 62 kg (03/18 0500) ? ?Hemodynamic parameters for last 24 hours: ?  ? ?Intake/Output from previous day: ?03/17 0701 - 03/18 0700 ?In: 761.8 [I.V.:661.8; IV Piggyback:100] ?Out: 2425 [Urine:2425] ?Intake/Output this shift: ?Total I/O ?In: 65.4 [I.V.:65.4] ?Out: 300 [Urine:300] ? ?General appearance: alert, cooperative, and mild distress ?Neurologic: intact ?Heart: tachy, regular ?Lungs: diminished breath sounds bibasilar ?Abdomen: normal findings: soft, non-tender ?Wound: clean and dry ? ?Lab Results: ?Recent Labs  ?  04/07/21 ?7353 04/08/21 ?0505  ?WBC 18.0* 16.7*  ?HGB 10.3* 9.8*  ?HCT 29.5* 29.1*  ?PLT PLATELET CLUMPS NOTED ON SMEAR, UNABLE TO ESTIMATE 128*  ? ?BMET:  ?Recent Labs  ?  04/07/21 ?0435 04/08/21 ?0505  ?NA 130* 131*  ?K 4.3 3.8  ?CL 97* 96*  ?CO2 26 27  ?GLUCOSE 129* 117*  ?BUN 16 16  ?CREATININE 1.34* 1.30*  ?CALCIUM 8.4* 8.3*  ?  ?PT/INR:  ?Recent Labs  ?  04/05/21 ?1631  ?LABPROT 17.1*  ?INR 1.4*  ? ?ABG ?   ?Component Value Date/Time  ? PHART 7.355 04/06/2021 0435  ? HCO3 22.8 04/06/2021 0435  ? TCO2 24 04/06/2021 0435  ? ACIDBASEDEF 3.0 (H) 04/06/2021 0435  ? O2SAT 97 04/06/2021 0435  ? ?CBG (last 3)  ?Recent Labs  ?  04/07/21 ?1540 04/07/21 ?2128 04/08/21 ?0759  ?GLUCAP 122* 142* 155*  ? ? ?Assessment/Plan: ?S/P Procedure(s) (LRB): ?CORONARY ARTERY BYPASS GRAFTING (CABG) x3, Using Left Internal  Mammory Artery and Right Leg greater saphenous vein harvested endoscopically (N/A) ?TRANSESOPHAGEAL ECHOCARDIOGRAM (TEE) (N/A) ?Plan for transfer to step-down: see transfer orders ?POD # 3  ?NEURO- intact ?CV- in Sinus tach ? Transition amiodarone to PO ? Increase metoprolol to 25 BID ? ASA, statin ?RESP- ILD, left lower lobe atelectasis ? Continue IS, diuresis ?RENAL- creatinine stable ? PO lasix + K ?GI- tolerating PO ?PAIN- continue current regimen ?ENDO- CBG well controlled ?Continue cardiac rehab ? LOS: 7 days  ? ? ?Melrose Nakayama ?04/08/2021 ? ? ?

## 2021-04-08 NOTE — Evaluation (Signed)
Physical Therapy Evaluation ?Patient Details ?Name: Samantha Osborne ?MRN: [id number] ?DOB: 1955/12/16 ?Today's Date: 04/08/2021 ? ?History of Present Illness ? Pt is a 66 y.o. female who presented 04/01/21 with L-sided chest pain. S/p L heart cath and coronary angiography 3/13. 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 Extubated 3/15. PMH: carotid artery occlusion, depression, DVT, HTN ?  ?Clinical Impression ? Pt presents with condition above and deficits mentioned below, see PT Problem List. PTA, she was IND without DME, living with her husband, adult son, and daughter-in-law in a 1-level house with 2 STE. Pt was able to perform all functional mobility, including gait without UE support and x2 stairs with 1 UE support at a min guard-supervision level. Pt requiring repeated cues intermittently to comply with sternal precautions, handout provided. Pt also with mild balance deficits and decreased activity tolerance. Pt's SpO2 levels ranged from reading 60s-90s% on RA throughout session and pt did not appear and denied being symptomatic. Thus, unsure of accuracy of SpO2 readings. Will continue to follow acutely to maximize pt's return to baseline and continue educating pt on her sternal precautions. Pt is not far from her baseline and I anticipate she will progress quickly, thus no follow-up PT necessary at d/c.  ?   ? ?Recommendations for follow up therapy are one component of a multi-disciplinary discharge planning process, led by the attending physician.  Recommendations may be updated based on patient status, additional functional criteria and insurance authorization. ? ?Follow Up Recommendations No PT follow up ? ?  ?Assistance Recommended at Discharge PRN  ?Patient can return home with the following ? Assist for transportation;Assistance with cooking/housework ? ?  ?Equipment Recommendations None recommended by PT  ?Recommendations for Other Services ? OT consult  ?  ?Functional Status Assessment Patient has had a recent decline in their  functional status and demonstrates the ability to make significant improvements in function in a reasonable and predictable amount of time.  ? ?  ?Precautions / Restrictions Precautions ?Precautions: Sternal ?Precaution Booklet Issued: Yes (comment) ?Precaution Comments: provided handout and education ?Restrictions ?Weight Bearing Restrictions: Yes ?Other Position/Activity Restrictions: sternal precautions  ? ?  ? ?Mobility ? Bed Mobility ?Overal bed mobility: Needs Assistance ?Bed Mobility: Rolling, Sidelying to Sit, Sit to Supine ?Rolling: Supervision ?Sidelying to sit: Supervision, HOB elevated ?  ?Sit to supine: Supervision, HOB elevated ?  ?General bed mobility comments: Extra time and cues to push through legs to roll and then sit up. Supervision for safety and to cue to maintain sternal precautions. ?  ? ?Transfers ?Overall transfer level: Needs assistance ?Equipment used: None ?Transfers: Sit to/from Stand ?Sit to Stand: Supervision ?  ?  ?  ?  ?  ?General transfer comment: Needs cues to remember not to push up through arms, but rather keep on chest or lap to ensure compliance with sternal precautions. Supervision for safety ?  ? ?Ambulation/Gait ?Ambulation/Gait assistance: Min guard, Supervision ?Gait Distance (Feet): 260 Feet (x2 bouts of ~20 ft > ~260 ft) ?Assistive device: None ?Gait Pattern/deviations: Step-through pattern, Decreased stride length, Decreased dorsiflexion - left, Decreased dorsiflexion - right ?Gait velocity: reduced ?Gait velocity interpretation: <1.8 ft/sec, indicate of risk for recurrent falls ?  ?General Gait Details: Pt with slow, short steps, intermittently brushing feet against ground with swing phase. Pt mostly steady but walking mildly hesitantly, no LOB, even with changes in head position, min guard-supervision for safety. ? ?Stairs ?Stairs: Yes ?Stairs assistance: Min guard ?Stair Management: One rail Left, One rail Right, Step to pattern, Forwards ?Number  of Stairs:  2 ?General stair comments: Hand on L wall to simulate home set-up when ascending, R rail when descending. No LOB, min guard for safety. ? ?Wheelchair Mobility ?  ? ?Modified Rankin (Stroke Patients Only) ?  ? ?  ? ?Balance Overall balance assessment: Mild deficits observed, not formally tested ?  ?  ?  ?  ?  ?  ?  ?  ?  ?  ?  ?  ?  ?  ?  ?  ?  ?  ?   ? ? ? ?Pertinent Vitals/Pain Pain Assessment ?Pain Assessment: 0-10 ?Pain Score: 5  ?Pain Location: sternum ?Pain Descriptors / Indicators: Discomfort, Operative site guarding ?Pain Intervention(s): Limited activity within patient's tolerance, Monitored during session, Repositioned, Patient requesting pain meds-RN notified  ? ? ?Home Living Family/patient expects to be discharged to:: Private residence ?Living Arrangements: Spouse/significant other;Children (adult son and his wife) ?Available Help at Discharge: Family;Available 24 hours/day ?Type of Home: House ?Home Access: Stairs to enter ?Entrance Stairs-Rails: None ?Entrance Stairs-Number of Steps: 2 ?  ?Home Layout: One level ?Home Equipment: BSC/3in1;Rolling Walker (2 wheels) ?   ?  ?Prior Function Prior Level of Function : Independent/Modified Independent;Driving ?  ?  ?  ?  ?  ?  ?Mobility Comments: Does not use AD. ?ADLs Comments: Does not work. ?  ? ? ?Hand Dominance  ?   ? ?  ?Extremity/Trunk Assessment  ? Upper Extremity Assessment ?Upper Extremity Assessment: Defer to OT evaluation ?  ? ?Lower Extremity Assessment ?Lower Extremity Assessment: Overall WFL for tasks assessed ?  ? ?Cervical / Trunk Assessment ?Cervical / Trunk Assessment: Other exceptions ?Cervical / Trunk Exceptions: Stiffness noted with cervical movements  ?Communication  ? Communication: No difficulties  ?Cognition Arousal/Alertness: Awake/alert ?Behavior During Therapy: [facility] for tasks assessed/performed ?Overall Cognitive Status: Within Functional Limits for tasks assessed ?  ?  ?  ?  ?  ?  ?  ?  ?  ?  ?  ?  ?  ?  ?  ?  ?  ?  ?  ? ?   ?General Comments General comments (skin integrity, edema, etc.): HR up to 110s; SpO2 ranging from 60s-90s% on RA, unsure of accuracy of reading as pt did not seem symptomatic, barely SOB with extended activity, kept 2L on pt end of session for safety ? ?  ?Exercises    ? ?Assessment/Plan  ?  ?PT Assessment Patient needs continued PT services  ?PT Problem List Decreased activity tolerance;Decreased balance;Decreased mobility;Decreased knowledge of precautions;Pain ? ?   ?  ?PT Treatment Interventions DME instruction;Gait training;Stair training;Functional mobility training;Therapeutic activities;Therapeutic exercise;Balance training;Neuromuscular re-education;Patient/family education   ? ?PT Goals (Current goals can be found in the Care Plan section)  ?Acute Rehab PT Goals ?Patient Stated Goal: to go home ?PT Goal Formulation: With patient ?Time For Goal Achievement: 04/22/21 ?Potential to Achieve Goals: Good ? ?  ?Frequency Min 3X/week ?  ? ? ?Co-evaluation   ?  ?  ?  ?  ? ? ?  ?AM-PAC PT "6 Clicks" Mobility  ?Outcome Measure Help needed turning from your back to your side while in a flat bed without using bedrails?: A Little ?Help needed moving from lying on your back to sitting on the side of a flat bed without using bedrails?: A Little ?Help needed moving to and from a bed to a chair (including a wheelchair)?: A Little ?Help needed standing up from a chair using your arms (e.g., wheelchair or bedside  chair)?: A Little ?Help needed to walk in hospital room?: A Little ?Help needed climbing 3-5 steps with a railing? : A Little ?6 Click Score: 18 ? ?  ?End of Session Equipment Utilized During Treatment: Oxygen ?Activity Tolerance: Patient tolerated treatment well ?Patient left: in bed;with call bell/phone within reach ?Nurse Communication: Patient requests pain meds ?PT Visit Diagnosis: Other abnormalities of gait and mobility (R26.89);Pain ?Pain - part of body:  (sternum) ?  ? ?Time: 5217-4715 ?PT Time Calculation  (min) (ACUTE ONLY): 17 min ? ? ?Charges:   PT Evaluation ?$PT Eval Low Complexity: 1 Low ?  ?  ?   ? ? ?Moishe Spice, PT, DPT ?Acute Rehabilitation Services  ?Pager: [phone] ?Office: [phone] ? ? ?Makeyla Govan M Pe

## 2021-04-09 ENCOUNTER — Inpatient Hospital Stay (HOSPITAL_COMMUNITY): Payer: Medicare Other

## 2021-04-09 LAB — CBC
HCT: 27.1 % — ABNORMAL LOW (ref 36.0–46.0)
Hemoglobin: 9.2 g/dL — ABNORMAL LOW (ref 12.0–15.0)
MCH: 31.7 pg (ref 26.0–34.0)
MCHC: 33.9 g/dL (ref 30.0–36.0)
MCV: 93.4 fL (ref 80.0–100.0)
Platelets: 149 10*3/uL — ABNORMAL LOW (ref 150–400)
RBC: 2.9 MIL/uL — ABNORMAL LOW (ref 3.87–5.11)
RDW: 15 % (ref 11.5–15.5)
WBC: 14.7 10*3/uL — ABNORMAL HIGH (ref 4.0–10.5)
nRBC: 0 % (ref 0.0–0.2)

## 2021-04-09 LAB — GLUCOSE, CAPILLARY
Glucose-Capillary: 84 mg/dL (ref 70–99)
Glucose-Capillary: 148 mg/dL — ABNORMAL HIGH (ref 70–99)
Glucose-Capillary: 122 mg/dL — ABNORMAL HIGH (ref 70–99)

## 2021-04-09 LAB — BASIC METABOLIC PANEL
Anion gap: 8 (ref 5–15)
BUN: 20 mg/dL (ref 8–23)
CO2: 27 mmol/L (ref 22–32)
Calcium: 8.2 mg/dL — ABNORMAL LOW (ref 8.9–10.3)
Chloride: 99 mmol/L (ref 98–111)
Creatinine, Ser: 1.6 mg/dL — ABNORMAL HIGH (ref 0.44–1.00)
GFR, Estimated: 35 mL/min — ABNORMAL LOW (ref >60)
Glucose, Bld: 111 mg/dL — ABNORMAL HIGH (ref 70–99)
Potassium: 4.2 mmol/L (ref 3.5–5.1)
Sodium: 134 mmol/L — ABNORMAL LOW (ref 135–145)

## 2021-04-09 MED ORDER — ACETAMINOPHEN 325 MG PO TABS: 650.0000 mg | ORAL_TABLET | Freq: Four times a day (QID) | ORAL | Status: DC | PRN

## 2021-04-09 MED ORDER — FUROSEMIDE 40 MG PO TABS
40.0000 mg | ORAL_TABLET | Freq: Every day | ORAL | Status: DC
Start: 2021-04-10 — End: 2021-04-10
  Administered 2021-04-10: 40 mg via ORAL
  Filled 2021-04-09: qty 1

## 2021-04-09 MED ORDER — LACTULOSE 10 GM/15ML PO SOLN
20.0000 g | Freq: Once | ORAL | Status: AC
Start: 2021-04-09 — End: 2021-04-09
  Administered 2021-04-09: 20 g via ORAL
  Filled 2021-04-09: qty 30

## 2021-04-09 NOTE — Progress Notes (Addendum)
? ?   ?  [address] ?      [place] [address] ?            [phone]   ? ?  ? ? ?4 Days Post-Op Procedure(s) (LRB): ?CORONARY ARTERY BYPASS GRAFTING (CABG) x3, Using Left Internal Mammory Artery and Right Leg greater saphenous vein harvested endoscopically (N/A) ?TRANSESOPHAGEAL ECHOCARDIOGRAM (TEE) (N/A) ? ?Subjective: ?Patient sitting in chair. She has not had a bowel movement yet. ?Back pain (chronic) is "ok" this am. ? ?Objective: ?Vital signs in last 24 hours: ?Temp:  [97.6 ?F (36.4 ?C)-99.3 ?F (37.4 ?C)] 98.7 ?F (37.1 ?C) (03/19 0734) ?Pulse Rate:  [70-103] 100 (03/19 0734) ?Cardiac Rhythm: Normal sinus rhythm (03/19 0030) ?Resp:  [16-20] 16 (03/19 0734) ?BP: (76-129)/(48-95) 117/69 (03/19 0734) ?SpO2:  [87 %-100 %] 93 % (03/19 0734) ? ?Pre op weight 56.1 kg ?Current Weight  ?04/08/21 62 kg  ? ?  ? ?Intake/Output from previous day: ?03/18 0701 - 03/19 0700 ?In: 110.9 [I.V.:110.9] ?Out: 1002 [Urine:1002] ? ? ?Physical Exam: ? ?Cardiovascular: Slightly tachycardic ?Pulmonary:Slightly diminished bibasilar breath sounds ?Abdomen: Soft, non tender, bowel sounds present. ?Extremities: Mild bilateral lower extremity edema. ?Wound: Sternal dressing removed and wound is clean and dry.  No erythema or signs of infection. ? ?Lab Results: ?CBC: ?Recent Labs  ?  04/08/21 ?0505 04/09/21 ?0131  ?WBC 16.7* 14.7*  ?HGB 9.8* 9.2*  ?HCT 29.1* 27.1*  ?PLT 128* 149*  ? ?BMET:  ?Recent Labs  ?  04/08/21 ?0505 04/09/21 ?0131  ?NA 131* 134*  ?K 3.8 4.2  ?CL 96* 99  ?CO2 27 27  ?GLUCOSE 117* 111*  ?BUN 16 20  ?CREATININE 1.30* 1.60*  ?CALCIUM 8.3* 8.2*  ?  ?PT/INR:  ?Lab Results  ?Component Value Date  ? INR 1.4 (H) 04/05/2021  ? INR 1.0 04/04/2021  ? ?ABG:  ?INR: ?Will add last result for INR, ABG once components are confirmed ?Will add last 4 CBG results once components are confirmed ? ?Assessment/Plan: ? ?1. CV - Previous a fib. SR, slightly tachy at times. On Amiodarone 400 mg bid, Amlodipine 5 mg daily, and  Lopressor 25 mg bid;not able to increase BB secondary to SBP 110's. ?2.  Pulmonary - On room air. CXR this am appears stable (chronic ILD and atelectasis). Encourage incentive spirometer. ?3. Volume Overload - On Lasix 40 mg daily ?4.  Expected post op acute blood loss anemia - H and H this am slightly decreased to 9.2 and 27.1 ?5. History of chronic pain-continue Norco PRN, Oxy PRN breakthrough, scheduled Tylenol ?6. CBGs 138/148/84. Pre op HGA1C 6.Stop accu checks and SS PRN ?7. Mild thrombocytopenia-platelets this am up to 149,000 ?8. Creatinine this am increased to 1.6. Will hold Lasix and re check in am ?9. History of hypothyroidism-continue Levothyroxine 150 mcg daily. ?10. On Lovenox for DVT prophylaxis ?11. Remove EPW ?12. LOC constipation ?13. Home 1-2 days ? ?Samantha M ZimmermanPA-C ?8:24 AM ?  ?Patient seen and examined, agree with above ?C/o that she is not getting her pain meds- explained narcotics are PRN and she needs to ask for them specifically ? ?Samantha Standard Roxan Hockey, MD ?[facility] ?[phone] ? ?

## 2021-04-09 NOTE — Evaluation (Signed)
Occupational Therapy Evaluation and Discharge ?Patient Details ?Name: Samantha Osborne ?MRN: [id number] ?DOB: [birth year] ?Today's Date: 04/09/2021 ? ? ?History of Present Illness Pt is a 66 y.o. female who presented 04/01/21 with L-sided chest pain. S/p L heart cath and coronary angiography 3/13. 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 Extubated 3/15. PMH: carotid artery occlusion, depression, DVT, HTN  ? ?Clinical Impression ?  ?This 66 yo female admitted and underwent above presents to acute OT at an overall Mod I level except for getting OOB she needs minA,. Has family at home that can A prn, no further OT needs, we will sign off.  ?   ? ?Recommendations for follow up therapy are one component of a multi-disciplinary discharge planning process, led by the attending physician.  Recommendations may be updated based on patient status, additional functional criteria and insurance authorization.  ? ?Follow Up Recommendations ? No OT follow up  ?  ?Assistance Recommended at Discharge PRN  ?Patient can return home with the following  (little help OOB) ? ?  ?Functional Status Assessment ? Patient has had a recent decline in their functional status and demonstrates the ability to make significant improvements in function in a reasonable and predictable amount of time. (no further skilled OT needs, just time)  ?Equipment Recommendations ? None recommended by OT  ?  ?   ?Precautions / Restrictions Precautions ?Precautions: Sternal ?Precaution Comments: provided education ?Restrictions ?Weight Bearing Restrictions: Yes ?Other Position/Activity Restrictions: sternal precautions  ? ?  ? ?Mobility Bed Mobility ?Overal bed mobility: Needs Assistance ?Bed Mobility: Supine to Sit ?  ?  ?Supine to sit: Min assist ?  ?  ?  ?  ? ?Transfers ?Overall transfer level: Modified independent ?Equipment used: None ?  ?  ?  ?  ?  ?  ?  ?  ?  ? ?  ?Balance Overall balance assessment: Mild deficits observed, not formally tested ?  ?  ?  ?  ?  ?  ?  ?  ?  ?  ?  ?  ?  ?  ?  ?   ?  ?  ?   ? ?ADL either performed or assessed with clinical judgement  ? ?ADL Overall ADL's : Modified independent ?  ?  ?  ?  ?  ?  ?  ?  ?  ?  ?  ?  ?  ?  ?  ?  ?  ?  ?  ?   ? ? ? ?Vision Patient Visual Report: No change from baseline ?   ?   ?   ?   ? ?Pertinent Vitals/Pain Pain Assessment ?Pain Assessment: Faces ?Faces Pain Scale: Hurts little more ?Pain Location: sternum ?Pain Descriptors / Indicators: Discomfort, Operative site guarding ?Pain Intervention(s): Limited activity within patient's tolerance  ? ? ? ?Hand Dominance Right ?  ?Extremity/Trunk Assessment Upper Extremity Assessment ?Upper Extremity Assessment: Overall WFL for tasks assessed ?  ?  ?  ?  ?  ?Communication Communication ?Communication: No difficulties ?  ?Cognition Arousal/Alertness: Awake/alert ?Behavior During Therapy: [facility] for tasks assessed/performed ?Overall Cognitive Status: Within Functional Limits for tasks assessed ?  ?  ?  ?  ?  ?  ?  ?  ?  ?  ?  ?  ?  ?  ?  ?  ?  ?  ?  ?   ?   ?   ? ? ?Home Living Family/patient expects to be discharged to:: Private residence ?Living Arrangements: Spouse/significant other;Children (adult  son and dtr in law) ?Available Help at Discharge: Family;Available 24 hours/day ?Type of Home: House ?Home Access: Stairs to enter ?Entrance Stairs-Number of Steps: 2 ?Entrance Stairs-Rails: None ?Home Layout: One level ?  ?  ?Bathroom Shower/Tub: Tub/shower unit;Curtain ?  ?Bathroom Toilet: Standard ?  ?  ?Home Equipment: BSC/3in1;Rolling Walker (2 wheels);Shower seat;Hand held shower head ?  ?  ?  ? ?  ?Prior Functioning/Environment Prior Level of Function : Independent/Modified Independent ?  ?  ?  ?  ?  ?  ?Mobility Comments: Does not use AD. ?ADLs Comments: Does not work. ?  ? ?  ?  ?OT Problem List: Impaired balance (sitting and/or standing);Pain ?  ?   ?   ?OT Goals(Current goals can be found in the care plan section) Acute Rehab OT Goals ?Patient Stated Goal: home soon  ?   ? ?   ?AM-PAC OT "6 Clicks"  Daily Activity     ?Outcome Measure Help from another person eating meals?: None ?Help from another person taking care of personal grooming?: None ?Help from another person toileting, which includes using toliet, bedpan, or urinal?: None ?Help from another person bathing (including washing, rinsing, drying)?: None ?Help from another person to put on and taking off regular upper body clothing?: None ?Help from another person to put on and taking off regular lower body clothing?: None ?6 Click Score: 24 ?  ?End of Session   ? ?Activity Tolerance: Patient tolerated treatment well ?Patient left: in chair;with call bell/phone within reach ? ?OT Visit Diagnosis: Pain;Other abnormalities of gait and mobility (R26.89);Muscle weakness (generalized) (M62.81) ?Pain - part of body:  (incisional)  ?              ?Time: 5397-6734 ?OT Time Calculation (min): 16 min ?Charges:  OT General Charges ?$OT Visit: 1 Visit ?OT Evaluation ?$OT Eval Moderate Complexity: 1 Mod ? ?[place], OTR/L ?Acute Rehab Services ?Pager [phone] ?Office [phone] ? ? ? ?Almon Register ?04/09/2021, 9:17 AM ?

## 2021-04-09 NOTE — Progress Notes (Signed)
Epicardial pacing wires removed per MD order without difficulty.  Patient educated on frequent BP checks and bed rest for an hour.  Will continue to monitor. ?

## 2021-04-09 NOTE — Progress Notes (Signed)
Patient reported having two BMs this night. ?

## 2021-04-09 NOTE — Progress Notes (Signed)
Patient ambulated in hallway approximately 800 feet without assistance.  Patient tolerated ambulation well.  Will continue to monitor. ?

## 2021-04-10 LAB — BASIC METABOLIC PANEL
Anion gap: 9 (ref 5–15)
BUN: 19 mg/dL (ref 8–23)
CO2: 26 mmol/L (ref 22–32)
Calcium: 8.4 mg/dL — ABNORMAL LOW (ref 8.9–10.3)
Chloride: 99 mmol/L (ref 98–111)
Creatinine, Ser: 1.51 mg/dL — ABNORMAL HIGH (ref 0.44–1.00)
GFR, Estimated: 38 mL/min — ABNORMAL LOW (ref >60)
Glucose, Bld: 113 mg/dL — ABNORMAL HIGH (ref 70–99)
Potassium: 4.4 mmol/L (ref 3.5–5.1)
Sodium: 134 mmol/L — ABNORMAL LOW (ref 135–145)

## 2021-04-10 LAB — CBC
HCT: 30.9 % — ABNORMAL LOW (ref 36.0–46.0)
Hemoglobin: 10.1 g/dL — ABNORMAL LOW (ref 12.0–15.0)
MCH: 31.1 pg (ref 26.0–34.0)
MCHC: 32.7 g/dL (ref 30.0–36.0)
MCV: 95.1 fL (ref 80.0–100.0)
Platelets: 209 10*3/uL (ref 150–400)
RBC: 3.25 MIL/uL — ABNORMAL LOW (ref 3.87–5.11)
RDW: 14.8 % (ref 11.5–15.5)
WBC: 15.5 10*3/uL — ABNORMAL HIGH (ref 4.0–10.5)
nRBC: 0 % (ref 0.0–0.2)

## 2021-04-10 MED ORDER — METOPROLOL TARTRATE 37.5 MG PO TABS: 25.0000 mg | ORAL_TABLET | Freq: Two times a day (BID) | ORAL | 3 refills | Status: DC

## 2021-04-10 MED ORDER — ACETAMINOPHEN 325 MG PO TABS: 650.0000 mg | ORAL_TABLET | Freq: Four times a day (QID) | ORAL | Status: AC | PRN

## 2021-04-10 MED ORDER — CLOPIDOGREL BISULFATE 75 MG PO TABS: 75.0000 mg | ORAL_TABLET | Freq: Every day | ORAL | 3 refills | Status: AC

## 2021-04-10 MED ORDER — FUROSEMIDE 40 MG PO TABS: 40.0000 mg | ORAL_TABLET | Freq: Every day | ORAL | 1 refills | Status: AC | PRN

## 2021-04-10 MED ORDER — METOPROLOL TARTRATE 37.5 MG PO TABS
37.5000 mg | ORAL_TABLET | Freq: Two times a day (BID) | ORAL | 3 refills | Status: AC
Start: 2021-04-10 — End: ?

## 2021-04-10 MED ORDER — POTASSIUM CHLORIDE CRYS ER 20 MEQ PO TBCR: 20.0000 meq | EXTENDED_RELEASE_TABLET | Freq: Every day | ORAL | 1 refills | Status: AC | PRN

## 2021-04-10 MED ORDER — AMIODARONE HCL 200 MG PO TABS
400.0000 mg | ORAL_TABLET | Freq: Two times a day (BID) | ORAL | 1 refills | Status: AC
Start: 2021-04-10 — End: ?

## 2021-04-10 NOTE — Plan of Care (Signed)
?  Problem: Education: ?Goal: Knowledge of General Education information will improve ?Description: Including pain rating scale, medication(s)/side effects and non-pharmacologic comfort measures ?Outcome: Adequate for Discharge ?  ?Problem: Health Behavior/Discharge Planning: ?Goal: Ability to manage health-related needs will improve ?Outcome: Adequate for Discharge ?  ?Problem: Clinical Measurements: ?Goal: Ability to maintain clinical measurements within normal limits will improve ?Outcome: Adequate for Discharge ?Goal: Will remain free from infection ?Outcome: Adequate for Discharge ?Goal: Diagnostic test results will improve ?Outcome: Adequate for Discharge ?Goal: Respiratory complications will improve ?Outcome: Adequate for Discharge ?Goal: Cardiovascular complication will be avoided ?Outcome: Adequate for Discharge ?  ?Problem: Activity: ?Goal: Risk for activity intolerance will decrease ?Outcome: Adequate for Discharge ?  ?Problem: Nutrition: ?Goal: Adequate nutrition will be maintained ?Outcome: Adequate for Discharge ?  ?Problem: Coping: ?Goal: Level of anxiety will decrease ?Outcome: Adequate for Discharge ?  ?Problem: Elimination: ?Goal: Will not experience complications related to bowel motility ?Outcome: Adequate for Discharge ?Goal: Will not experience complications related to urinary retention ?Outcome: Adequate for Discharge ?  ?Problem: Pain Managment: ?Goal: General experience of comfort will improve ?Outcome: Adequate for Discharge ?  ?Problem: Safety: ?Goal: Ability to remain free from injury will improve ?Outcome: Adequate for Discharge ?  ?Problem: Skin Integrity: ?Goal: Risk for impaired skin integrity will decrease ?Outcome: Adequate for Discharge ?  ?Problem: Cardiovascular: ?Goal: Ability to achieve and maintain adequate cardiovascular perfusion will improve ?Outcome: Adequate for Discharge ?Goal: Vascular access site(s) Level 0-1 will be maintained ?Outcome: Adequate for Discharge ?   ?Problem: Education: ?Goal: Will demonstrate proper wound care and an understanding of methods to prevent future damage ?Outcome: Adequate for Discharge ?Goal: Knowledge of disease or condition will improve ?Outcome: Adequate for Discharge ?Goal: Knowledge of the prescribed therapeutic regimen will improve ?Outcome: Adequate for Discharge ?Goal: Individualized Educational Video(s) ?Outcome: Adequate for Discharge ?  ?Problem: Activity: ?Goal: Risk for activity intolerance will decrease ?Outcome: Adequate for Discharge ?  ?Problem: Cardiac: ?Goal: Will achieve and/or maintain hemodynamic stability ?Outcome: Adequate for Discharge ?  ?Problem: Clinical Measurements: ?Goal: Postoperative complications will be avoided or minimized ?Outcome: Adequate for Discharge ?  ?Problem: Respiratory: ?Goal: Respiratory status will improve ?Outcome: Adequate for Discharge ?  ?Problem: Skin Integrity: ?Goal: Wound healing without signs and symptoms of infection ?Outcome: Adequate for Discharge ?Goal: Risk for impaired skin integrity will decrease ?Outcome: Adequate for Discharge ?  ?Problem: Urinary Elimination: ?Goal: Ability to achieve and maintain adequate renal perfusion and functioning will improve ?Outcome: Adequate for Discharge ?  ?Problem: Acute Rehab PT Goals(only PT should resolve) ?Goal: Pt Will Go Supine/Side To Sit ?Outcome: Adequate for Discharge ?Goal: Pt Will Go Sit To Supine/Side ?Outcome: Adequate for Discharge ?Goal: Patient Will Transfer Sit To/From Stand ?Outcome: Adequate for Discharge ?Goal: Pt Will Transfer Bed To Chair/Chair To Bed ?Outcome: Adequate for Discharge ?Goal: Pt Will Ambulate ?Outcome: Adequate for Discharge ?Goal: Pt Will Go Up/Down Stairs ?Outcome: Adequate for Discharge ?Goal: Pt Will Verbalize and Adhere to Precautions While ?Description: PT Will Verbalize and Adhere to Precautions While Performing Mobility ?Outcome: Adequate for Discharge ?  ?

## 2021-04-10 NOTE — Progress Notes (Signed)
CARDIAC REHAB PHASE I  ? ?Pt up ambulating in room independently with steady gait. Pt denies DME needs. D/c education completed with pt and spouse. Pt educated in importance of site care and monitoring incisions daily. Encouraged continued IS use, walks, and sternal precautions. Pt given in-the-tube sheet along with heart healthy diet and smoking cessation tip sheet. Reviewed restrictions and exercise guidelines. Will refer to CRP II [place]. ? ?[phone] ?Rufina Falco, RN BSN ?04/10/2021 ?10:20 AM ? ?

## 2021-04-10 NOTE — Progress Notes (Signed)
Pt discharged home, per order. Chest tube sutures remained. PA notified for sooner appointment for [facility] office. PA to call office and arrange appointment for pt. ?

## 2021-04-10 NOTE — Progress Notes (Signed)
Patient given discharge instructions and stated understanding. 

## 2021-04-10 NOTE — Progress Notes (Addendum)
? ?   ?  [address] ?      [place] [address] ?            [phone]   ? ?  ?5 Days Post-Op Procedure(s) (LRB): ?CORONARY ARTERY BYPASS GRAFTING (CABG) x3, Using Left Internal Mammory Artery and Right Leg greater saphenous vein harvested endoscopically (N/A) ?TRANSESOPHAGEAL ECHOCARDIOGRAM (TEE) (N/A) ? ?Subjective: ? ?Patient sitting up in bed w/o complaints.  She has been able to move her bowels. She is hoping to go home today. ? ?Objective: ?Vital signs in last 24 hours: ?Temp:  [98.4 ?F (36.9 ?C)-98.7 ?F (37.1 ?C)] 98.7 ?F (37.1 ?C) (03/19 2054) ?Pulse Rate:  [87-108] 108 (03/19 2054) ?Cardiac Rhythm: Sinus tachycardia (03/19 1947) ?Resp:  [16-20] 18 (03/19 2054) ?BP: (104-136)/(65-83) 136/83 (03/19 2054) ?SpO2:  [92 %-98 %] 98 % (03/19 2054) ?Weight:  [61.7 kg] 61.7 kg (03/20 0450) ? ?Intake/Output from previous day: ?03/19 0701 - 03/20 0700 ?In: 480 [P.O.:480] ?Out: 0  ? ?General appearance: alert, cooperative, and no distress ?Heart: regular rate and rhythm and tachycardia ?Lungs: clear to auscultation bilaterally ?Abdomen: soft, non-tender; bowel sounds normal; no masses,  no organomegaly ?Extremities: edema none present ?Wound: clean and dry ? ?Lab Results: ?Recent Labs  ?  04/09/21 ?0131 04/10/21 ?7681  ?WBC 14.7* 15.5*  ?HGB 9.2* 10.1*  ?HCT 27.1* 30.9*  ?PLT 149* 209  ? ?BMET:  ?Recent Labs  ?  04/09/21 ?0131 04/10/21 ?0433  ?NA 134* 134*  ?K 4.2 4.4  ?CL 99 99  ?CO2 27 26  ?GLUCOSE 111* 113*  ?BUN 20 19  ?CREATININE 1.60* 1.51*  ?CALCIUM 8.2* 8.4*  ?  ?PT/INR: No results for input(s): LABPROT, INR in the last 72 hours. ?ABG ?   ?Component Value Date/Time  ? PHART 7.355 04/06/2021 0435  ? HCO3 22.8 04/06/2021 0435  ? TCO2 24 04/06/2021 0435  ? ACIDBASEDEF 3.0 (H) 04/06/2021 0435  ? O2SAT 97 04/06/2021 0435  ? ?CBG (last 3)  ?Recent Labs  ?  04/09/21 ?0630 04/09/21 ?1109 04/09/21 ?1638  ?GLUCAP 84 148* 122*  ? ? ?Assessment/Plan: ?S/P Procedure(s) (LRB): ?CORONARY ARTERY BYPASS  GRAFTING (CABG) x3, Using Left Internal Mammory Artery and Right Leg greater saphenous vein harvested endoscopically (N/A) ?TRANSESOPHAGEAL ECHOCARDIOGRAM (TEE) (N/A) ? ?CV- PAF, Sinus Tach- continue Amiodarone, Lopressor, Norvasc ?Pulm- off oxygen, no acute issues, continue IS ?Renal- creatinine is at baseline at 1.50. will order Lasix prn for discharge ?Expected post operative blood loss anemia, stable ?Dispo- patient stable, will titrate BB for additional HR coverage, will d/c home today ? ? LOS: 9 days  ? ?Ellwood Handler, PA-C ?04/10/2021 ? ?Patient examined and CXR image reviewed ?DC instructions reviewed with patient ?Home on plavix , 81 ASA for preop ACS ? ?patient examined and medical record reviewed,agree with above note. ?Dahlia Byes ?04/10/2021 ? ?

## 2021-04-10 NOTE — TOC Transition Note (Signed)
Transition of Care (TOC) - CM/SW Discharge Note ?Marvetta Gibbons Therapist, sports, BSN ?Transitions of Care ?Unit 4E- RN Case Manager ?See Treatment Team for direct phone #  ? ? ?Patient Details  ?Name: Samantha Osborne ?MRN: [id number] ?Date of Birth: 1955-06-13 ? ?Transition of Care (TOC) CM/SW Contact:  ?Dahlia Client, Romeo Rabon, RN ?Phone Number: ?04/10/2021, 11:17 AM ? ? ?Clinical Narrative:    ?Pt stable for transition home today. S/p CABG- no TOC needs noted.  ? ? ?Final next level of care: Home/Self Care ?Barriers to Discharge: No Barriers Identified ? ? ?Patient Goals and CMS Choice ?  ?  ?Choice offered to / list presented to : NA ? ?Discharge Placement ?  ?           ?  ? Home ?  ?  ? ?Discharge Plan and Services ?  ?  ?           ?DME Arranged: N/A ?DME Agency: NA ?  ?  ?  ?HH Arranged: NA ?[place] Agency: NA ?  ?  ?  ? ?Social Determinants of Health (SDOH) Interventions ?  ? ? ?Readmission Risk Interventions ?Readmission Risk Prevention Plan 04/10/2021  ?Transportation Screening Complete  ?PCP or Specialist Appt within 5-7 Days Complete  ?Home Care Screening Complete  ?Medication Review (RN CM) Complete  ?Some recent data might be hidden  ? ? ? ? ? ?

## 2021-04-10 NOTE — Care Management Important Message (Signed)
Important Message ? ?Patient Details  ?Name: Samantha Osborne ?MRN: [id number] ?Date of Birth: [birth year] ? ? ?Medicare Important Message Given:  Yes ? ? ? ? ?Shelda Altes ?04/10/2021, 10:42 AM ?

## 2021-04-10 NOTE — Progress Notes (Signed)
Mobility Specialist Progress Note ? ? 04/10/21 1040  ?Mobility  ?Activity Ambulated independently in hallway  ?Level of Assistance Independent after set-up  ?Assistive Device None  ?Distance Ambulated (ft) 430 ft  ?Activity Response Tolerated well  ?$Mobility charge 1 Mobility  ? ?Received pt in bed having no complaints and agreeable to mobility. Asymptomatic throughout ambulation, returned back to bed w/ call bell in reach and all needs met. ? ?Holland Falling ?Mobility Specialist ?Phone Number [phone] ? ?

## 2021-04-13 ENCOUNTER — Encounter (HOSPITAL_COMMUNITY): Payer: Self-pay

## 2021-04-13 ENCOUNTER — Encounter (HOSPITAL_COMMUNITY): Admission: EM | Disposition: E | Payer: Self-pay | Source: Home / Self Care | Attending: Internal Medicine

## 2021-04-13 ENCOUNTER — Encounter (HOSPITAL_COMMUNITY): Payer: Self-pay | Admitting: Certified Registered"

## 2021-04-13 ENCOUNTER — Emergency Department (HOSPITAL_COMMUNITY): Payer: Medicare Other

## 2021-04-13 ENCOUNTER — Other Ambulatory Visit: Payer: Self-pay

## 2021-04-13 ENCOUNTER — Inpatient Hospital Stay (HOSPITAL_COMMUNITY): Payer: Medicare Other | Admitting: Certified Registered"

## 2021-04-13 ENCOUNTER — Inpatient Hospital Stay (HOSPITAL_COMMUNITY): Payer: Medicare Other

## 2021-04-13 DIAGNOSIS — I447 Left bundle-branch block, unspecified: Secondary | ICD-10-CM | POA: Diagnosis not present

## 2021-04-13 DIAGNOSIS — N183 Chronic kidney disease, stage 3 unspecified: Secondary | ICD-10-CM | POA: Diagnosis present

## 2021-04-13 DIAGNOSIS — I214 Non-ST elevation (NSTEMI) myocardial infarction: Principal | ICD-10-CM

## 2021-04-13 DIAGNOSIS — E785 Hyperlipidemia, unspecified: Secondary | ICD-10-CM | POA: Diagnosis not present

## 2021-04-13 DIAGNOSIS — R570 Cardiogenic shock: Secondary | ICD-10-CM | POA: Diagnosis not present

## 2021-04-13 DIAGNOSIS — Z20822 Contact with and (suspected) exposure to covid-19: Secondary | ICD-10-CM | POA: Diagnosis not present

## 2021-04-13 DIAGNOSIS — I5021 Acute systolic (congestive) heart failure: Secondary | ICD-10-CM | POA: Diagnosis not present

## 2021-04-13 DIAGNOSIS — I4891 Unspecified atrial fibrillation: Secondary | ICD-10-CM | POA: Diagnosis present

## 2021-04-13 DIAGNOSIS — Z743 Need for continuous supervision: Secondary | ICD-10-CM | POA: Diagnosis not present

## 2021-04-13 DIAGNOSIS — R079 Chest pain, unspecified: Secondary | ICD-10-CM | POA: Diagnosis not present

## 2021-04-13 DIAGNOSIS — J849 Interstitial pulmonary disease, unspecified: Secondary | ICD-10-CM | POA: Diagnosis present

## 2021-04-13 DIAGNOSIS — I255 Ischemic cardiomyopathy: Secondary | ICD-10-CM | POA: Diagnosis present

## 2021-04-13 DIAGNOSIS — D649 Anemia, unspecified: Secondary | ICD-10-CM | POA: Diagnosis not present

## 2021-04-13 DIAGNOSIS — I130 Hypertensive heart and chronic kidney disease with heart failure and stage 1 through stage 4 chronic kidney disease, or unspecified chronic kidney disease: Secondary | ICD-10-CM | POA: Diagnosis not present

## 2021-04-13 DIAGNOSIS — Z87891 Personal history of nicotine dependence: Secondary | ICD-10-CM

## 2021-04-13 DIAGNOSIS — I462 Cardiac arrest due to underlying cardiac condition: Secondary | ICD-10-CM | POA: Diagnosis not present

## 2021-04-13 DIAGNOSIS — I25720 Atherosclerosis of autologous artery coronary artery bypass graft(s) with unstable angina pectoris: Secondary | ICD-10-CM | POA: Diagnosis not present

## 2021-04-13 DIAGNOSIS — I454 Nonspecific intraventricular block: Secondary | ICD-10-CM | POA: Diagnosis not present

## 2021-04-13 DIAGNOSIS — Z951 Presence of aortocoronary bypass graft: Secondary | ICD-10-CM

## 2021-04-13 DIAGNOSIS — I469 Cardiac arrest, cause unspecified: Secondary | ICD-10-CM | POA: Diagnosis not present

## 2021-04-13 DIAGNOSIS — I2510 Atherosclerotic heart disease of native coronary artery without angina pectoris: Secondary | ICD-10-CM | POA: Diagnosis not present

## 2021-04-13 DIAGNOSIS — I25110 Atherosclerotic heart disease of native coronary artery with unstable angina pectoris: Secondary | ICD-10-CM | POA: Diagnosis not present

## 2021-04-13 DIAGNOSIS — Z86718 Personal history of other venous thrombosis and embolism: Secondary | ICD-10-CM

## 2021-04-13 DIAGNOSIS — M549 Dorsalgia, unspecified: Secondary | ICD-10-CM | POA: Diagnosis present

## 2021-04-13 DIAGNOSIS — Z8249 Family history of ischemic heart disease and other diseases of the circulatory system: Secondary | ICD-10-CM

## 2021-04-13 DIAGNOSIS — G894 Chronic pain syndrome: Secondary | ICD-10-CM | POA: Diagnosis present

## 2021-04-13 DIAGNOSIS — R0789 Other chest pain: Secondary | ICD-10-CM | POA: Diagnosis not present

## 2021-04-13 DIAGNOSIS — J439 Emphysema, unspecified: Secondary | ICD-10-CM | POA: Diagnosis present

## 2021-04-13 DIAGNOSIS — I272 Pulmonary hypertension, unspecified: Secondary | ICD-10-CM | POA: Diagnosis present

## 2021-04-13 DIAGNOSIS — Z79891 Long term (current) use of opiate analgesic: Secondary | ICD-10-CM

## 2021-04-13 DIAGNOSIS — R0602 Shortness of breath: Secondary | ICD-10-CM | POA: Diagnosis not present

## 2021-04-13 DIAGNOSIS — I2584 Coronary atherosclerosis due to calcified coronary lesion: Secondary | ICD-10-CM | POA: Diagnosis not present

## 2021-04-13 DIAGNOSIS — I499 Cardiac arrhythmia, unspecified: Secondary | ICD-10-CM | POA: Diagnosis not present

## 2021-04-13 DIAGNOSIS — F32A Depression, unspecified: Secondary | ICD-10-CM | POA: Diagnosis present

## 2021-04-13 DIAGNOSIS — N179 Acute kidney failure, unspecified: Secondary | ICD-10-CM | POA: Diagnosis present

## 2021-04-13 DIAGNOSIS — I25710 Atherosclerosis of autologous vein coronary artery bypass graft(s) with unstable angina pectoris: Secondary | ICD-10-CM | POA: Diagnosis not present

## 2021-04-13 DIAGNOSIS — E039 Hypothyroidism, unspecified: Secondary | ICD-10-CM | POA: Diagnosis not present

## 2021-04-13 DIAGNOSIS — I509 Heart failure, unspecified: Secondary | ICD-10-CM

## 2021-04-13 DIAGNOSIS — I5043 Acute on chronic combined systolic (congestive) and diastolic (congestive) heart failure: Secondary | ICD-10-CM

## 2021-04-13 HISTORY — DX: Heart failure, unspecified: I50.9

## 2021-04-13 HISTORY — PX: RIGHT/LEFT HEART CATH AND CORONARY ANGIOGRAPHY: CATH118266

## 2021-04-13 LAB — COMPREHENSIVE METABOLIC PANEL
ALT: 23 U/L (ref 0–44)
AST: 34 U/L (ref 15–41)
Albumin: 3.1 g/dL — ABNORMAL LOW (ref 3.5–5.0)
Alkaline Phosphatase: 88 U/L (ref 38–126)
Anion gap: 11 (ref 5–15)
BUN: 22 mg/dL (ref 8–23)
CO2: 21 mmol/L — ABNORMAL LOW (ref 22–32)
Calcium: 8.6 mg/dL — ABNORMAL LOW (ref 8.9–10.3)
Chloride: 102 mmol/L (ref 98–111)
Creatinine, Ser: 1.75 mg/dL — ABNORMAL HIGH (ref 0.44–1.00)
GFR, Estimated: 32 mL/min — ABNORMAL LOW (ref >60)
Glucose, Bld: 147 mg/dL — ABNORMAL HIGH (ref 70–99)
Potassium: 4.4 mmol/L (ref 3.5–5.1)
Sodium: 134 mmol/L — ABNORMAL LOW (ref 135–145)
Total Bilirubin: 0.8 mg/dL (ref 0.3–1.2)
Total Protein: 6.7 g/dL (ref 6.5–8.1)

## 2021-04-13 LAB — CBC WITH DIFFERENTIAL/PLATELET
Abs Immature Granulocytes: 0.09 10*3/uL — ABNORMAL HIGH (ref 0.00–0.07)
Basophils Absolute: 0 10*3/uL (ref 0.0–0.1)
Basophils Relative: 0 %
Eosinophils Absolute: 0.2 10*3/uL (ref 0.0–0.5)
Eosinophils Relative: 1 %
HCT: 29.1 % — ABNORMAL LOW (ref 36.0–46.0)
Hemoglobin: 9.6 g/dL — ABNORMAL LOW (ref 12.0–15.0)
Immature Granulocytes: 1 %
Lymphocytes Relative: 13 %
Lymphs Abs: 2.1 10*3/uL (ref 0.7–4.0)
MCH: 31.5 pg (ref 26.0–34.0)
MCHC: 33 g/dL (ref 30.0–36.0)
MCV: 95.4 fL (ref 80.0–100.0)
Monocytes Absolute: 1.2 10*3/uL — ABNORMAL HIGH (ref 0.1–1.0)
Monocytes Relative: 7 %
Neutro Abs: 12.5 10*3/uL — ABNORMAL HIGH (ref 1.7–7.7)
Neutrophils Relative %: 78 %
Platelets: 285 10*3/uL (ref 150–400)
RBC: 3.05 MIL/uL — ABNORMAL LOW (ref 3.87–5.11)
RDW: 15 % (ref 11.5–15.5)
WBC: 16 10*3/uL — ABNORMAL HIGH (ref 4.0–10.5)
nRBC: 0 % (ref 0.0–0.2)

## 2021-04-13 LAB — RESP PANEL BY RT-PCR (FLU A&B, COVID) ARPGX2
Influenza A by PCR: NEGATIVE
Influenza B by PCR: NEGATIVE
SARS Coronavirus 2 by RT PCR: NEGATIVE

## 2021-04-13 LAB — ECHOCARDIOGRAM COMPLETE
Area-P 1/2: 3.85 cm2
Calc EF: 35.9 %
Height: 63 in
MV M vel: 4.46 m/s
MV Peak grad: 79.6 mmHg
S' Lateral: 4.3 cm
Single Plane A2C EF: 34.7 %
Single Plane A4C EF: 33.8 %
Weight: 1984 oz

## 2021-04-13 LAB — POCT I-STAT 7, (LYTES, BLD GAS, ICA,H+H)
Acid-base deficit: 5 mmol/L — ABNORMAL HIGH (ref 0.0–2.0)
Acid-base deficit: 5 mmol/L — ABNORMAL HIGH (ref 0.0–2.0)
Bicarbonate: 19.1 mmol/L — ABNORMAL LOW (ref 20.0–28.0)
Bicarbonate: 19.3 mmol/L — ABNORMAL LOW (ref 20.0–28.0)
Calcium, Ion: 1.07 mmol/L — ABNORMAL LOW (ref 1.15–1.40)
Calcium, Ion: 1.06 mmol/L — ABNORMAL LOW (ref 1.15–1.40)
HCT: 30 % — ABNORMAL LOW (ref 36.0–46.0)
HCT: 28 % — ABNORMAL LOW (ref 36.0–46.0)
Hemoglobin: 10.2 g/dL — ABNORMAL LOW (ref 12.0–15.0)
Hemoglobin: 9.5 g/dL — ABNORMAL LOW (ref 12.0–15.0)
O2 Saturation: 100 %
O2 Saturation: 100 %
Potassium: 4.3 mmol/L (ref 3.5–5.1)
Potassium: 4.2 mmol/L (ref 3.5–5.1)
Sodium: 132 mmol/L — ABNORMAL LOW (ref 135–145)
Sodium: 133 mmol/L — ABNORMAL LOW (ref 135–145)
TCO2: 20 mmol/L — ABNORMAL LOW (ref 22–32)
TCO2: 20 mmol/L — ABNORMAL LOW (ref 22–32)
pCO2 arterial: 32.7 mmHg (ref 32–48)
pCO2 arterial: 33.7 mmHg (ref 32–48)
pH, Arterial: 7.375 (ref 7.35–7.45)
pH, Arterial: 7.367 (ref 7.35–7.45)
pO2, Arterial: 322 mmHg — ABNORMAL HIGH (ref 83–108)
pO2, Arterial: 294 mmHg — ABNORMAL HIGH (ref 83–108)

## 2021-04-13 LAB — BRAIN NATRIURETIC PEPTIDE: B Natriuretic Peptide: 1651.4 pg/mL — ABNORMAL HIGH (ref 0.0–100.0)

## 2021-04-13 LAB — TROPONIN I (HIGH SENSITIVITY)
Troponin I (High Sensitivity): 2513 ng/L (ref <18)
Troponin I (High Sensitivity): 3677 ng/L (ref <18)
Troponin I (High Sensitivity): 8290 ng/L (ref <18)

## 2021-04-13 SURGERY — RIGHT/LEFT HEART CATH AND CORONARY ANGIOGRAPHY
Anesthesia: LOCAL

## 2021-04-13 MED ORDER — HEPARIN BOLUS VIA INFUSION
1500.0000 [IU] | Freq: Once | INTRAVENOUS | Status: AC
Start: 2021-04-13 — End: 2021-04-13
  Administered 2021-04-13: 1500 [IU] via INTRAVENOUS
  Filled 2021-04-13: qty 1500

## 2021-04-13 MED ORDER — NOREPINEPHRINE 4 MG/250ML-% IV SOLN
INTRAVENOUS | Status: AC
  Filled 2021-04-13: qty 250

## 2021-04-13 MED ORDER — PANTOPRAZOLE SODIUM 40 MG PO TBEC
40.0000 mg | DELAYED_RELEASE_TABLET | Freq: Every day | ORAL | Status: DC
Start: 2021-04-13 — End: 2021-04-13

## 2021-04-13 MED ORDER — HEPARIN (PORCINE) IN NACL 1000-0.9 UT/500ML-% IV SOLN
INTRAVENOUS | Status: AC
  Filled 2021-04-13: qty 500

## 2021-04-13 MED ORDER — SODIUM BICARBONATE 8.4 % IV SOLN
INTRAVENOUS | Status: DC | PRN
Start: 2021-04-13 — End: 2021-04-13
  Administered 2021-04-13 (×3): 50 meq via INTRAVENOUS

## 2021-04-13 MED ORDER — EPINEPHRINE 1 MG/10ML IJ SOSY
PREFILLED_SYRINGE | INTRAMUSCULAR | Status: AC
  Filled 2021-04-13: qty 10

## 2021-04-13 MED ORDER — HEPARIN (PORCINE) IN NACL 1000-0.9 UT/500ML-% IV SOLN
INTRAVENOUS | Status: DC | PRN
  Administered 2021-04-13 (×2): 500 mL

## 2021-04-13 MED ORDER — ASPIRIN EC 81 MG PO TBEC: 81.0000 mg | DELAYED_RELEASE_TABLET | Freq: Every day | ORAL | Status: DC

## 2021-04-13 MED ORDER — AMIODARONE HCL 200 MG PO TABS: 400.0000 mg | ORAL_TABLET | Freq: Two times a day (BID) | ORAL | Status: DC

## 2021-04-13 MED ORDER — ALBUTEROL SULFATE (2.5 MG/3ML) 0.083% IN NEBU: 2.5000 mg | INHALATION_SOLUTION | Freq: Every day | RESPIRATORY_TRACT | Status: DC | PRN

## 2021-04-13 MED ORDER — NITROGLYCERIN 0.4 MG SL SUBL: 0.4000 mg | SUBLINGUAL_TABLET | SUBLINGUAL | Status: DC | PRN

## 2021-04-13 MED ORDER — POTASSIUM CHLORIDE CRYS ER 20 MEQ PO TBCR: 20.0000 meq | EXTENDED_RELEASE_TABLET | Freq: Every day | ORAL | Status: DC

## 2021-04-13 MED ORDER — BUPROPION HCL ER (SR) 150 MG PO TB12
150.0000 mg | ORAL_TABLET | Freq: Every day | ORAL | Status: DC
  Filled 2021-04-13: qty 1

## 2021-04-13 MED ORDER — LIDOCAINE HCL (PF) 1 % IJ SOLN
INTRAMUSCULAR | Status: DC | PRN
  Administered 2021-04-13: 5 mL

## 2021-04-13 MED ORDER — SODIUM BICARBONATE 8.4 % IV SOLN
INTRAVENOUS | Status: AC
  Filled 2021-04-13: qty 150

## 2021-04-13 MED ORDER — ETOMIDATE 2 MG/ML IV SOLN
INTRAVENOUS | Status: DC | PRN
  Administered 2021-04-13: 10 mg via INTRAVENOUS

## 2021-04-13 MED ORDER — EPINEPHRINE HCL 5 MG/250ML IV SOLN IN NS
0.5000 ug/min | INTRAVENOUS | Status: DC
  Filled 2021-04-13 (×2): qty 250

## 2021-04-13 MED ORDER — EPINEPHRINE PF 1 MG/ML IJ SOLN
INTRAMUSCULAR | Status: DC | PRN
  Administered 2021-04-13 (×6): 1 mg

## 2021-04-13 MED ORDER — MORPHINE SULFATE (PF) 4 MG/ML IV SOLN
4.0000 mg | Freq: Once | INTRAVENOUS | Status: AC
  Administered 2021-04-13: 4 mg via INTRAVENOUS
  Filled 2021-04-13: qty 1

## 2021-04-13 MED ORDER — FLUTICASONE FUROATE-VILANTEROL 200-25 MCG/ACT IN AEPB
1.0000 | INHALATION_SPRAY | Freq: Every day | RESPIRATORY_TRACT | Status: DC
  Filled 2021-04-13: qty 28

## 2021-04-13 MED ORDER — HEPARIN SODIUM (PORCINE) 1000 UNIT/ML IJ SOLN
INTRAMUSCULAR | Status: AC
  Filled 2021-04-13: qty 10

## 2021-04-13 MED ORDER — EPINEPHRINE HCL 5 MG/250ML IV SOLN IN NS
INTRAVENOUS | Status: DC | PRN
  Administered 2021-04-13: 5 ug/min via INTRAVENOUS

## 2021-04-13 MED ORDER — LEVOTHYROXINE SODIUM 75 MCG PO TABS
150.0000 ug | ORAL_TABLET | Freq: Every day | ORAL | Status: DC
  Administered 2021-04-13: 150 ug via ORAL
  Filled 2021-04-13: qty 2

## 2021-04-13 MED ORDER — HEPARIN (PORCINE) 25000 UT/250ML-% IV SOLN
700.0000 [IU]/h | INTRAVENOUS | Status: DC
  Administered 2021-04-13: 700 [IU]/h via INTRAVENOUS
  Filled 2021-04-13: qty 250

## 2021-04-13 MED ORDER — OXYCODONE HCL 5 MG PO TABS
15.0000 mg | ORAL_TABLET | ORAL | Status: DC | PRN
Start: 2021-04-13 — End: 2021-04-13
  Administered 2021-04-13: 15 mg via ORAL
  Filled 2021-04-13: qty 3

## 2021-04-13 MED ORDER — HEPARIN SODIUM (PORCINE) 1000 UNIT/ML IJ SOLN
INTRAMUSCULAR | Status: DC | PRN
  Administered 2021-04-13: 7000 [IU] via INTRAVENOUS

## 2021-04-13 MED ORDER — SUCCINYLCHOLINE CHLORIDE 200 MG/10ML IV SOSY
PREFILLED_SYRINGE | INTRAVENOUS | Status: DC | PRN
  Administered 2021-04-13: 100 mg via INTRAVENOUS

## 2021-04-13 MED ORDER — PERFLUTREN LIPID MICROSPHERE
1.0000 mL | INTRAVENOUS | Status: AC | PRN
  Administered 2021-04-13: 1.5 mL via INTRAVENOUS
  Filled 2021-04-13: qty 10

## 2021-04-13 MED ORDER — FUROSEMIDE 10 MG/ML IJ SOLN
60.0000 mg | Freq: Once | INTRAMUSCULAR | Status: AC
  Administered 2021-04-13: 60 mg via INTRAVENOUS
  Filled 2021-04-13: qty 6

## 2021-04-13 MED ORDER — ONDANSETRON HCL 4 MG/2ML IJ SOLN
4.0000 mg | Freq: Four times a day (QID) | INTRAMUSCULAR | Status: DC | PRN
  Filled 2021-04-13: qty 2

## 2021-04-13 MED ORDER — ONDANSETRON HCL 4 MG/2ML IJ SOLN
4.0000 mg | Freq: Once | INTRAMUSCULAR | Status: AC
  Administered 2021-04-13: 4 mg via INTRAVENOUS
  Filled 2021-04-13: qty 2

## 2021-04-13 MED ORDER — ACETAMINOPHEN 325 MG PO TABS: 650.0000 mg | ORAL_TABLET | ORAL | Status: DC | PRN

## 2021-04-13 MED ORDER — SODIUM CHLORIDE 0.9% FLUSH: 3.0000 mL | INTRAVENOUS | Status: DC | PRN

## 2021-04-13 MED ORDER — ETOMIDATE 2 MG/ML IV SOLN
INTRAVENOUS | Status: DC | PRN
  Administered 2021-04-13: 20 mg via INTRAVENOUS

## 2021-04-13 MED ORDER — HYDROCODONE-ACETAMINOPHEN 10-325 MG PO TABS: 1.0000 | ORAL_TABLET | ORAL | Status: DC | PRN

## 2021-04-13 MED ORDER — FUROSEMIDE 10 MG/ML IJ SOLN
40.0000 mg | Freq: Two times a day (BID) | INTRAMUSCULAR | Status: DC
  Filled 2021-04-13: qty 4

## 2021-04-13 MED ORDER — ONDANSETRON HCL 4 MG/2ML IJ SOLN
4.0000 mg | Freq: Once | INTRAMUSCULAR | Status: AC
Start: 2021-04-13 — End: 2021-04-13
  Administered 2021-04-13: 4 mg via INTRAVENOUS

## 2021-04-13 MED ORDER — HEPARIN SODIUM (PORCINE) 5000 UNIT/ML IJ SOLN
5000.0000 [IU] | Freq: Three times a day (TID) | INTRAMUSCULAR | Status: DC
  Administered 2021-04-13: 5000 [IU] via SUBCUTANEOUS
  Filled 2021-04-13: qty 1

## 2021-04-13 MED ORDER — EPINEPHRINE 1 MG/10ML IJ SOSY
PREFILLED_SYRINGE | INTRAMUSCULAR | Status: AC
  Filled 2021-04-13: qty 40

## 2021-04-13 MED ORDER — CELECOXIB 100 MG PO CAPS: 100.0000 mg | ORAL_CAPSULE | Freq: Two times a day (BID) | ORAL | Status: DC | PRN

## 2021-04-13 MED ORDER — METHOCARBAMOL 500 MG PO TABS: 500.0000 mg | ORAL_TABLET | Freq: Four times a day (QID) | ORAL | Status: DC

## 2021-04-13 MED ORDER — ACETAMINOPHEN 325 MG PO TABS: 650.0000 mg | ORAL_TABLET | Freq: Four times a day (QID) | ORAL | Status: DC | PRN

## 2021-04-13 MED ORDER — SODIUM CHLORIDE 0.9 % IV SOLN: 250.0000 mL | INTRAVENOUS | Status: DC | PRN

## 2021-04-13 MED ORDER — NOREPINEPHRINE BITARTRATE 1 MG/ML IV SOLN
INTRAVENOUS | Status: DC | PRN
  Administered 2021-04-13: 5 ug/min via INTRAVENOUS

## 2021-04-13 MED ORDER — METOPROLOL TARTRATE 25 MG PO TABS
37.5000 mg | ORAL_TABLET | Freq: Two times a day (BID) | ORAL | Status: DC
Start: 2021-04-13 — End: 2021-04-13

## 2021-04-13 MED ORDER — ALBUTEROL SULFATE HFA 108 (90 BASE) MCG/ACT IN AERS: 2.0000 | INHALATION_SPRAY | Freq: Every day | RESPIRATORY_TRACT | Status: DC | PRN

## 2021-04-13 MED ORDER — CLOPIDOGREL BISULFATE 75 MG PO TABS: 75.0000 mg | ORAL_TABLET | Freq: Every day | ORAL | Status: DC

## 2021-04-13 MED ORDER — NOREPINEPHRINE 4 MG/250ML-% IV SOLN
0.0000 ug/min | INTRAVENOUS | Status: DC
  Filled 2021-04-13: qty 250

## 2021-04-13 MED ORDER — LIDOCAINE HCL (PF) 1 % IJ SOLN
INTRAMUSCULAR | Status: AC
  Filled 2021-04-13: qty 30

## 2021-04-13 MED ORDER — SODIUM CHLORIDE 0.9% FLUSH: 3.0000 mL | Freq: Two times a day (BID) | INTRAVENOUS | Status: DC

## 2021-04-13 SURGICAL SUPPLY — 21 items
BALLN SAPPHIRE 2.5X12 (BALLOONS) ×2
BALLOON SAPPHIRE 2.5X12 (BALLOONS) IMPLANT
CATH INFINITI 5 FR RCB (CATHETERS) ×1 IMPLANT
CATH INFINITI 5FR MULTPACK ANG (CATHETERS) ×1 IMPLANT
CATH SWAN GANZ 7F STRAIGHT (CATHETERS) ×1 IMPLANT
CATH VISTA GUIDE 6FR XBLAD3.5 (CATHETERS) ×1 IMPLANT
ELECT DEFIB PAD ADLT CADENCE (PAD) ×1 IMPLANT
KIT ENCORE 26 ADVANTAGE (KITS) ×1 IMPLANT
KIT HEART LEFT (KITS) ×2 IMPLANT
KIT MICROPUNCTURE NIT STIFF (SHEATH) ×1 IMPLANT
PACK CARDIAC CATHETERIZATION (CUSTOM PROCEDURE TRAY) ×2 IMPLANT
SHEATH PINNACLE 6F 10CM (SHEATH) ×2 IMPLANT
SHEATH PINNACLE 7F 10CM (SHEATH) ×1 IMPLANT
SHEATH PROBE COVER 6X72 (BAG) ×1 IMPLANT
SLEEVE REPOSITIONING LENGTH 30 (MISCELLANEOUS) ×1 IMPLANT
SYR MEDRAD MARK 7 150ML (SYRINGE) ×2 IMPLANT
TRANSDUCER W/STOPCOCK (MISCELLANEOUS) ×2 IMPLANT
TUBING CIL FLEX 10 FLL-RA (TUBING) ×2 IMPLANT
WIRE ASAHI PROWATER 180CM (WIRE) ×1 IMPLANT
WIRE EMERALD 3MM-J .035X150CM (WIRE) ×2 IMPLANT
WIRE PT2 MS 185 (WIRE) ×1 IMPLANT

## 2021-04-14 ENCOUNTER — Encounter (HOSPITAL_COMMUNITY): Payer: Self-pay | Admitting: Cardiology

## 2021-04-14 SURGERY — LEFT HEART CATH AND CORS/GRAFTS ANGIOGRAPHY
Anesthesia: LOCAL

## 2021-04-17 ENCOUNTER — Telehealth: Payer: Self-pay | Admitting: Cardiology

## 2021-04-17 NOTE — Telephone Encounter (Signed)
Dr. Gerarda Fraction calling requesting to speak with Dr. Harl Bowie  ?

## 2021-04-19 ENCOUNTER — Encounter (HOSPITAL_COMMUNITY): Payer: Self-pay | Admitting: Cardiology

## 2021-04-22 NOTE — Progress Notes (Signed)
Heart Failure Navigator Progress Note ? ?Following this hospitalization to assess for HV TOC readiness.  ? ?Plan to interview for TOC after cath and Echo . ? ?Earnestine Leys, BSN, RN ?Heart Failure Nurse Navigator ?[phone]  ?

## 2021-04-22 NOTE — ED Notes (Signed)
Pt would like to get some medication to help with sleep. Pt states she has not been able to rest for the last three days. RN will notify provider. ?

## 2021-04-22 NOTE — Progress Notes (Signed)
?  05-12-21 1241  ?Clinical Encounter Type  ?Visited With Patient and family together;Health care provider  ?Visit Type Post-op;Death;Critical Care;Initial  ?Referral From Nurse  ?Consult/Referral To Chaplain  ?Spiritual Encounters  ?Spiritual Needs Emotional;Grief support  ? ?Paged to CATH-Lab Procedure Room to meet family of Ms. Samantha Osborne. Samantha Osborne for [employer]. Upon arrival Chaplain met patient's primary nurse, and patient's husband, Mr. Samantha Osborne, who had already been notified of his wife's death. Chaplain provided meaningful presence and compassionate and comforting grief support to Samantha Osborne during his time of loss. Samantha Osborne showed appropriate expression of emotion and spoke of the 4 years of marriage that he and Samantha Osborne shared. He spoke of their two sons and four grandchildren with who will be of support and share in his grief. Samantha Osborne shared that he had already notified his wife's sister who has been very close to Samantha Osborne throughout life.  ? ?Samantha Osborne stated that he and Samantha Osborne had already begun to make funeral arrangements through [facility], [address], [place], [place] [address], [phone].  ?Chaplain escorted Samantha Osborne to his vehicle in [place]. [address] Samantha Osborne, Samantha Poot., [phone]  ?

## 2021-04-22 NOTE — ED Notes (Signed)
Pollina MD notified regarding pts critical troponin and BNP  ?

## 2021-04-22 NOTE — Progress Notes (Signed)
RT note-CPR and intubation in progress, patient was briefly placed on ventilator, continued to be pulseless and CPR continued, patient was BMV during CPR. Hemoptysis from ETT. Code called at 1220.  ?

## 2021-04-22 NOTE — ED Notes (Signed)
Pt ambulated to restroom with steady gait.

## 2021-04-22 NOTE — Death Summary Note (Addendum)
?Death Summary  ?  ?Patient ID: Samantha Osborne ?MRN: [id number]; DOB: 08-12-1955 ? ?Admit Date: 04/26/21 ?Date of Death: 04/26/21 ? ?Primary Care Provider: Redmond School, MD  ?Primary Cardiologist: Carlyle Dolly, MD  ?Primary Electrophysiologist:  None  ? ?Discharge Diagnoses  ?  ?Principal Problem: ?  Cardiogenic shock ([place]) ?Active Problems: ?  Acute systolic CHF (congestive heart failure) ([place]) ?  Non-ST elevation (NSTEMI) myocardial infarction [facility]) ?  S/P CABG x 3 ?  CAD (coronary artery disease) ? ? ? ?Diagnostic Studies/Procedures  ?  ?Echocardiogram 26-Apr-2021: ?Impressions: ? 1. Global hypokinesis with relative sparing of the base. Anterior,  ?anteroseptal, and apical akinesis. Left ventricular ejection fraction, by  ?estimation, is 20 to 25%. The left ventricle has severely decreased  ?function. The left ventricle demonstrates  ?regional wall motion abnormalities (see scoring diagram/findings for  ?description). Left ventricular diastolic parameters are consistent with  ?Grade II diastolic dysfunction (pseudonormalization).  ? 2. Right ventricular systolic function is normal. The right ventricular  ?size is normal. There is normal pulmonary artery systolic pressure.  ? 3. The mitral valve is normal in structure. Mild mitral valve  ?regurgitation. No evidence of mitral stenosis.  ? 4. The aortic valve is tricuspid. There is mild calcification of the  ?aortic valve. There is mild thickening of the aortic valve. Aortic valve  ?regurgitation is trivial. No aortic stenosis is present.  ? 5. The inferior vena cava is normal in size with greater than 50%  ?respiratory variability, suggesting right atrial pressure of 3 mmHg.  ? ?Comparison(s): A prior study was performed on 04/03/2021. The left  ?ventricular function is significantly worse.  ?_____________ ? ?Right/Left Cardiac Catheterization 2021-04-26: ?  Mid RCA to Dist RCA lesion is 75% stenosed. ?  Prox RCA to Mid RCA lesion is 80% stenosed. ?  Mid LM to  Dist LM lesion is 100% stenosed. ?  Prox Graft to Mid Graft lesion is 100% stenosed. ?  Origin to Prox Graft lesion is 100% stenosed. ?  Dist Graft to Insertion lesion is 40% stenosed. ?  LIMA graft was visualized by non-selective angiography. ?  SVG graft was visualized by angiography. ?  SVG graft was visualized by angiography and is large. ?  Hemodynamic findings consistent with mild pulmonary hypertension. ?  ?Left main occlusion. Severe RCA disease ?Occluded LIMA to the LAD ?Occluded SVG to OM1 ?Patent SVG to PDA with thrombus in the distal graft. ?Elevated right heart and LV filling pressures ?Refractory cardiogenic shock and PEA arrest leading to patient demise ?Severe PAD ? ?Diagnostic ?Dominance: Right ? ? ?  ?History of Present Illness   ?  ?Samantha Osborne is a 66 y.o. female with with a history of CAD s/p recent CABG x3 (LIMA to LAD, SVG to OM1, SVG to PDA) on 04/05/2021, interstitial lung disease, hypertension, hyperlipidemia, CKD stage III, hypothyroidism, prior DVT in 2008, and chronic back pain. She was recently admitted from 04/01/2021 to 04/10/2021 after presenting with chest pain that radiated to both sides of her neck and arm with associated shortness of breath at times. She had mildly elevated troponin at that time. Echo showed LVEF of 60-65%. She underwent LHC on 04/03/2021 which showed severe 3 vessel CAD including 90% left main disease. CT surgery was consulted and she underwent CABG x3 with LIMA to LAD, SVG to OM1, SVG to PDA. She did have some post-op atrial fibrillation but converted with IV Amiodarone. She was transitioned to PO Amiodarone but was not started on anticoagulation.   ? ?  Patient represented to the ED on May 06, 2021 via EMS for recurrent chest pain. Patient and husband stated she did well the first day after being home but then started to have recurrent chest pain that radiated to her neck/jaw. She also reported progressive shortness of breath and stated she was sleeping in a  recliner. EKG showed mild diffuse ST depressions. BNP 1,651. Initial high-sensitivity 2,513. Chest x-ray showed chronic interstitial lung disease. She was felt to be volume overloaded on exam and was started on IV Lasix and admitted for acute CHF. ? ?Hospital Course  ? ?She was admitted for acute CHF as stated above. Repeat troponin came back at 3,677 and then 8,290 so she was started on IV Heparin for NSTEMI. She initially responded to the IV Lasix but then decompensated while waiting for a bed in the ED. She became diaphoretic and tachycardic with a wide complex QRS. She also began to complain of jaw pain and back pain with associated nausea so she was given a dose of IV Morphine and Zofran. She then became hypotensive. We were called to bedside and on evaluation she was mildly diaphoretic and cool and appeared to be in distress rolling around in bed. Echo showed significant drop in EF to 20-25% with anterior, anteroseptal, and apical akinesis. She was taken for emergent cardiac catheterization given concern for cardiogenic shock and concern that recent grafts were down. Cath showed patent SVG to PDA but LIMA to LAD and SVG to OM1 were occluded and she had elevated right heart and LV filling pressures. She was in severe cardiogenic shock. Angiography of the femoral artery demonstrated severe disease and it was inadequate to support large bore access for balloon pump or Impella support. Advanced Heart Failure team, CT Surgery, and Vascular Surgery were all consulted and consideration was given for possible insertion of an Impella using axillary approach. However, in the interim patient continued to decline and developed PEA. High quality CPR was performed and she was started on IV pressors and bicarbonate. Intermittent CPR was required and Critical Care came to intubate patient. PCI of the left main was attempted but we were unable to cross the occlusion with a prowater wire. The patient was coded for a prolonged  period without being able to restore any effective BP with ongoing PEA. At this point, it was felt that placing axillary support or attempting further PCI was futile. The patient was pronounced dead at  12:20pm.  ? ?Time of death: 12:20pm ?_____________ ? ?Labs & Radiologic Studies  ?  ?CBC ?Recent Labs  ?  2021/05/06 ?0225 06-May-2021 ?1137 2021/05/06 ?1141  ?WBC 16.0*  --   --   ?NEUTROABS 12.5*  --   --   ?HGB 9.6* 10.2* 9.5*  ?HCT 29.1* 30.0* 28.0*  ?MCV 95.4  --   --   ?PLT 285  --   --   ? ?Basic Metabolic Panel ?Recent Labs  ?  05-06-21 ?0225 May 06, 2021 ?1137 2021-05-06 ?1141  ?NA 134* 132* 133*  ?K 4.4 4.3 4.2  ?CL 102  --   --   ?CO2 21*  --   --   ?GLUCOSE 147*  --   --   ?BUN 22  --   --   ?CREATININE 1.75*  --   --   ?CALCIUM 8.6*  --   --   ? ?Liver Function Tests ?Recent Labs  ?  2021-05-06 ?0225  ?AST 34  ?ALT 23  ?ALKPHOS 88  ?BILITOT 0.8  ?PROT 6.7  ?ALBUMIN  3.1*  ? ?No results for input(s): LIPASE, AMYLASE in the last 72 hours. ?High Sensitivity Troponin:   ?Recent Labs  ?Lab 04/02/21 ?9373 04/03/21 ?4287 04/18/21 ?0225 April 18, 2021 ?6811 April 18, 2021 ?5726  ?TROPONINIHS 51* 50* 2,513* 3,677* 8,290*  ?  ?BNP ?Invalid input(s): POCBNP ?D-Dimer ?No results for input(s): DDIMER in the last 72 hours. ?Hemoglobin A1C ?No results for input(s): HGBA1C in the last 72 hours. ?Fasting Lipid Panel ?No results for input(s): CHOL, HDL, LDLCALC, TRIG, CHOLHDL, LDLDIRECT in the last 72 hours. ?Thyroid Function Tests ?No results for input(s): TSH, T4TOTAL, T3FREE, THYROIDAB in the last 72 hours. ? ?Invalid input(s): FREET3 ?_____________  ?DG Chest 2 View ? ?Result Date: 04/09/2021 ?CLINICAL DATA:  Encounter for S/P CABG (coronary artery bypass graft). EXAM: CHEST - 2 VIEW COMPARISON:  March 18, 23. FINDINGS: Similar patchy bilateral interstitial and airspace opacities. Small bilateral pleural effusions. No visible pneumothorax. Mildly enlarged cardiac silhouette. Median sternotomy. CABG. IMPRESSION: 1. Similar patchy bilateral  interstitial and airspace opacities, probably largely chronic. Superimposed acute mild edema or infection is difficult to exclude. 2. Small bilateral pleural effusions. 3. CABG and cardiomegaly. Electronically Signed

## 2021-04-22 NOTE — ED Notes (Signed)
RN spoke with Sande Rives PA. Callie will be down shortly to speak with pt.  ?

## 2021-04-22 NOTE — Anesthesia Procedure Notes (Signed)
Procedure Name: Intubation ?Date/Time: 2021-04-20 12:00 PM ?Performed by: Barrington Ellison, CRNA ?Pre-anesthesia Checklist: Patient identified, Emergency Drugs available, Suction available and Patient being monitored ?Patient Re-evaluated:Patient Re-evaluated prior to induction ?Oxygen Delivery Method: Circle System Utilized ?Preoxygenation: Pre-oxygenation with 100% oxygen ?Induction Type: IV induction and Rapid sequence ?Ventilation: Mask ventilation without difficulty and Two handed mask ventilation required ?Laryngoscope Size: Glidescope and 4 ?Grade View: Grade I ?Tube type: Oral ?Tube size: 7.5 mm ?Number of attempts: 1 ?Airway Equipment and Method: Stylet and Oral airway ?Placement Confirmation: ETT inserted through vocal cords under direct vision, positive ETCO2 and breath sounds checked- equal and bilateral ?Secured at: 22 cm ?Tube secured with: Tape ?Dental Injury: Teeth and Oropharynx as per pre-operative assessment  ? ? ? ? ?

## 2021-04-22 NOTE — ED Triage Notes (Signed)
Pt bib [facility] c.o central chest pain radiating to neck and jaw + weakness in left arm. Pt had triple bypass last week, discharged 3 days ago. No STE on EKG per EMS. Pain initially unbearable when EMS was called, 4/10 upon EMS arrival. Pt given 1 nitro and '4mg'$  morphine, pain 1/10. Pt axo x4, 20G LAC ?

## 2021-04-22 NOTE — ED Provider Notes (Signed)
?[facility] ?Provider Note ? ? ?CSN: [id number] ?Arrival date & time: 05/06/21  0209 ? ?  ? ?History ? ?Chief Complaint  ?Patient presents with  ? Chest Pain  ? ? ?Samantha Osborne is a 66 y.o. female. ? ?Patient presents to the emergency department for evaluation of chest pain, generalized weakness, shortness of breath.  Patient reports that she had triple bypass on March 15.  She went home from the hospital 2 days ago.  Patient reports that over the last 24 hours she has developed severe dyspnea on exertion, generalized weakness.  Tonight she began to develop pain in the left chest that radiated to the left jaw and arm.  Patient received 1 sublingual nitroglycerin and morphine during transport, pain nearly resolved. ? ? ?  ? ?Home Medications ?Prior to Admission medications   ?Medication Sig Start Date End Date Taking? Authorizing Provider  ?acetaminophen (TYLENOL) 325 MG tablet Take 2 tablets (650 mg total) by mouth every 6 (six) hours as needed for mild pain or moderate pain. 04/10/21   [name], [name], PA-C  ?albuterol (VENTOLIN HFA) 108 (90 Base) MCG/ACT inhaler Inhale 2 puffs into the lungs daily as needed for shortness of breath. 01/06/21   [provider]  ?amiodarone (PACERONE) 200 MG tablet Take 2 tablets (400 mg total) by mouth 2 (two) times daily. For 5 days, then decrease to 200 mg BID x 7 days, then decrease to 200 mg daily 04/10/21   [name], [name], PA-C  ?aspirin EC 81 MG tablet Take 81 mg by mouth daily.    [provider]  ?buPROPion (WELLBUTRIN SR) 150 MG 12 hr tablet Take 1 tablet by mouth daily. 02/15/15   [provider]  ?butalbital-acetaminophen-caffeine (FIORICET, ESGIC) 50-325-40 MG tablet Take 1 tablet by mouth every 4 (four) hours as needed. 03/22/15   [provider]  ?celecoxib (CELEBREX) 100 MG capsule Take 100 mg by mouth 2 (two) times daily as needed. 06/05/19   [provider]  ?clopidogrel (PLAVIX) 75 MG  tablet Take 1 tablet (75 mg total) by mouth daily. 04/10/21 04/10/22  [name], [name], PA-C  ?Evolocumab with Infusor ([place]) 420 MG/3.5ML SOCT Inject 420 mg into the skin every 14 (fourteen) days.    [provider]  ?fluticasone furoate-vilanterol (BREO ELLIPTA) 200-25 MCG/ACT AEPB Inhale 1 puff into the lungs daily. 03/15/21   Mannam, [name], MD  ?furosemide (LASIX) 40 MG tablet Take 1 tablet (40 mg total) by mouth daily as needed. Please take on days you have a weight gain of 3 lbs 04/10/21   [name], [name], PA-C  ?HYDROcodone-acetaminophen (NORCO) 10-325 MG per tablet Take 1 tablet by mouth every 4 (four) hours as needed for moderate pain. Reported on 07/05/2015 09/07/13   [provider]  ?levothyroxine (SYNTHROID) 150 MCG tablet Take 150 mcg by mouth daily. 03/06/21   [provider]  ?methocarbamol (ROBAXIN) 500 MG tablet Take 1 tablet (500 mg total) by mouth 4 (four) times daily. 09/11/13   [name], PA-C  ?Metoprolol Tartrate 37.5 MG TABS Take 37.5 mg by mouth 2 (two) times daily. 04/10/21   [name], [name], PA-C  ?nitroGLYCERIN (NITROSTAT) 0.4 MG SL tablet Place 1 tablet (0.4 mg total) under the tongue every 5 (five) minutes as needed for chest pain. 03/23/21 06/21/21  [name], MD  ?omeprazole (PRILOSEC) 40 MG capsule Take 1 capsule (40 mg total) by mouth daily. 03/15/21   [name], MD  ?  oxyCODONE (OXY IR/ROXICODONE) 5 MG immediate release tablet Take 15 mg by mouth every 4 (four) hours as needed for severe pain. 06/15/15   [provider]  ?potassium chloride SA (KLOR-CON M) 20 MEQ tablet Take 1 tablet (20 mEq total) by mouth daily as needed. On days of weight gain of 3 lbs 04/10/21   [name], [name], PA-C  ?valACYclovir (VALTREX) 1000 MG tablet Take 1,000 mg by mouth daily. 02/03/21   [provider]  ?   ? ?Allergies    ?Pravastatin and Zetia [ezetimibe]   ? ?Review of Systems   ?Review of Systems  ?Respiratory:  Positive for  shortness of breath.   ?Cardiovascular:  Positive for chest pain.  ? ?Physical Exam ?Updated Vital Signs ?BP (!) 131/97   Pulse (!) 103   Temp 99 ?F (37.2 ?C) (Oral)   Resp 20   Ht '5\' 3"'$  (1.6 m)   Wt 56.2 kg   SpO2 96%   BMI 21.97 kg/m?  ?Physical Exam ?Vitals and nursing note reviewed.  ?Constitutional:   ?   General: She is not in acute distress. ?   Appearance: She is well-developed.  ?HENT:  ?   Head: Normocephalic and atraumatic.  ?   Mouth/Throat:  ?   Mouth: Mucous membranes are moist.  ?Eyes:  ?   General: Vision grossly intact. Gaze aligned appropriately.  ?   Extraocular Movements: Extraocular movements intact.  ?   Conjunctiva/sclera: Conjunctivae normal.  ?Cardiovascular:  ?   Rate and Rhythm: Regular rhythm. Tachycardia present.  ?   Pulses: Normal pulses.  ?   Heart sounds: Normal heart sounds, S1 normal and S2 normal. No murmur heard. ?  No friction rub. No gallop.  ?Pulmonary:  ?   Effort: Pulmonary effort is normal. No respiratory distress.  ?   Breath sounds: Decreased breath sounds present.  ?Abdominal:  ?   General: Bowel sounds are normal.  ?   Palpations: Abdomen is soft.  ?   Tenderness: There is no abdominal tenderness. There is no guarding or rebound.  ?   Hernia: No hernia is present.  ?Musculoskeletal:     ?   General: No swelling.  ?   Cervical back: Full passive range of motion without pain, normal range of motion and neck supple. No spinous process tenderness or muscular tenderness. Normal range of motion.  ?   Right lower leg: Edema present.  ?   Left lower leg: Edema present.  ?Skin: ?   General: Skin is warm and dry.  ?   Capillary Refill: Capillary refill takes less than 2 seconds.  ?   Findings: No ecchymosis, erythema, rash or wound.  ?Neurological:  ?   General: No focal deficit present.  ?   Mental Status: She is alert and oriented to person, place, and time.  ?   GCS: GCS eye subscore is 4. GCS verbal subscore is 5. GCS motor subscore is 6.  ?   Cranial Nerves: Cranial  nerves 2-12 are intact.  ?   Sensory: Sensation is intact.  ?   Motor: Motor function is intact.  ?   Coordination: Coordination is intact.  ?Psychiatric:     ?   Attention and Perception: Attention normal.     ?   Mood and Affect: Mood normal.     ?   Speech: Speech normal.     ?   Behavior: Behavior normal.  ? ? ?ED Results / Procedures / Treatments   ?  Labs ?(all labs ordered are listed, but only abnormal results are displayed) ?Labs Reviewed  ?CBC WITH DIFFERENTIAL/PLATELET - Abnormal; Notable for the following components:  ?    Result Value  ? WBC 16.0 (*)   ? RBC 3.05 (*)   ? Hemoglobin 9.6 (*)   ? HCT 29.1 (*)   ? Neutro Abs 12.5 (*)   ? Monocytes Absolute 1.2 (*)   ? Abs Immature Granulocytes 0.09 (*)   ? All other components within normal limits  ?COMPREHENSIVE METABOLIC PANEL - Abnormal; Notable for the following components:  ? Sodium 134 (*)   ? CO2 21 (*)   ? Glucose, Bld 147 (*)   ? Creatinine, Ser 1.75 (*)   ? Calcium 8.6 (*)   ? Albumin 3.1 (*)   ? GFR, Estimated 32 (*)   ? All other components within normal limits  ?BRAIN NATRIURETIC PEPTIDE - Abnormal; Notable for the following components:  ? B Natriuretic Peptide 1,651.4 (*)   ? All other components within normal limits  ?TROPONIN I (HIGH SENSITIVITY) - Abnormal; Notable for the following components:  ? Troponin I (High Sensitivity) 2,513 (*)   ? All other components within normal limits  ?RESP PANEL BY RT-PCR (FLU A&B, COVID) [id number]  ?TROPONIN I (HIGH SENSITIVITY)  ? ? ?EKG ?EKG Interpretation ? ?Date/Time:  May 02, 2021 02:17:34 EDT ?Ventricular Rate:  109 ?PR Interval:  125 ?QRS Duration: 134 ?QT Interval:  388 ?QTC Calculation: 523 ?R Axis:   -30 ?Text Interpretation: Sinus tachycardia Nonspecific IVCD with LAD Left ventricular hypertrophy Abnormal T, consider ischemia, diffuse leads Confirmed by Orpah Greek [phone]) on 2021/05/02 2:22:49 AM ? ?Radiology ?DG Chest Port 1 View ? ?Result Date: 2021-05-02 ?CLINICAL DATA:  Chest  pain EXAM: PORTABLE CHEST 1 VIEW COMPARISON:  04/09/2021 FINDINGS: Multifocal patchy/interstitial opacities, left lung predominant, favoring chronic interstitial lung disease when correlating with prior studies. S

## 2021-04-22 NOTE — ED Notes (Signed)
Secondary RN and NT noticed EKG changes. EKG repeated and provided to EDP to assess. ? ?Primary RN Ship broker to page Card Master to verify provider over pt care. ? ?EDP placed order for one time dose Morphine and Zofran.  ? ?Sande Rives PA was paged ?

## 2021-04-22 NOTE — ED Notes (Signed)
2LNC applied for comfort ?

## 2021-04-22 NOTE — H&P (Signed)
?Cardiology Admission History and Physical:  ? ?Patient ID: Samantha Osborne ?MRN: [id number]; DOB: 01-11-1956  ? ?Admission date: 26-Apr-2021 ? ?PCP:  Redmond School, MD ?  ?[facility] Providers ?Cardiologist:  Carlyle Dolly, MD      ? ? ?Chief Complaint:  SOB, and chest pain ? ?Patient Profile:  ? ?Samantha Osborne is a 66 y.o. 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 ? ?History of Present Illness:  ? ?Samantha Osborne is a 66 y.o. 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 ? ?Patient was discharged recently after CABG (on 3/15). She presented 3/11 with unstable angina, CATH showed multivessel dx, underwent CABG, postop had anemia, afib and discharged home on 3/20. ? ?Following discharge, patient has been weak, unable to do anything, gradual SOB getting worse, and last night was worse, sleeps in recliner, no LE edema but progressive SOB+ , no cough, fevers or any other URTI symptoms. ?Also reports last night had episode of chest pressure and jaw pain radiating up. She had similar pain when she came on 3/11 and diagnosed with unstable angina. However, her pain gets worse with rbeathing and hard to breath every time. She seems misarable due to SOB and did not want to give further history ? ?Er work up showed mild diffuse ST depressions (likely demand ischemia vs global ischemia) and CXR- increased pulm vascular congestion, Bnp elevated 1651, trop  2513->3677elevated. Patient did get SL nitro by  EMS but no change in her symptoms. She has AKI 1.75.  ?Got IV lasix '60mg'$  in the ER . ?She is compliant with meds ? ?At baseline, her ILD is stable, no oxygen, on inhalers only. Doing pulm rehab well. ? ? ? ?Relevant CV Studies: ?TEE: intra op; EF 45% ? ?ECHO: 04/03/21 ? 1. Left ventricular ejection fraction, by estimation, is 60 to 65%. The  ?left ventricle has normal function. The left ventricle has no regional  ?wall motion abnormalities. There is mild left ventricular hypertrophy of  ?the basal-septal segment. Left  ?ventricular diastolic parameters are consistent with Grade I diastolic  ?dysfunction (impaired relaxation).  ? 2. Right ventricular systolic function is normal. The right ventricular  ?size is normal. There is normal pulmonary artery systolic pressure. The  ?estimated right ventricular systolic pressure is 66.4 mmHg.  ? 3. The mitral valve is normal in structure. Trivial mitral valve  ?regurgitation. No evidence of mitral stenosis. Moderate mitral annular  ?calcification.  ? 4. The aortic valve is tricuspid. Aortic valve regurgitation is not  ?visualized. Mild aortic valve stenosis. Aortic valve area, by VTI measures  ?1.45 cm?Marland Kitchen Aortic valve mean gradient measures 4.5 mmHg. Aortic valve Vmax  ?measures 1.42 m/s. Despite a low  ?mean AVG, the SVI is low (<34) and AVA is calculated at 1.45cm2. Suspect  ?paradoxical low flow low gradient mild AS.  ? 5. The inferior vena cava is normal in size with greater than 50%  ?respiratory variability, suggesting right atrial pressure of 3 mmHg.  ? ?CABG; 04/05/21 ?LIMA to LAD/diag, SVG-Om1, SVG  to PDA) ? ?CATH:04/03/21 ?  Dist LM lesion is 90% stenosed. ?  Mid RCA to Dist RCA lesion is 75% stenosed. ?  Prox RCA to Mid RCA lesion is 50% stenosed. ?  Ost Cx lesion is 75% stenosed.  Circumflex is small.  RIght to left collaterals to the distal circumflex. ?  Prox LAD to Mid LAD lesion is 25% stenosed. ?  LV end diastolic pressure is normal. ?  There is no aortic valve  stenosis. ?  ?Severe three vessel coroanry artery disease.   ?  ?Cardiac surgery consult.  ?  ?Would restart heparin IV 8 hours post sheath pull.  ? ? ?Past Medical History:  ?Diagnosis Date  ? Acute exacerbation of CHF (congestive heart failure) ([facility]) Apr 26, 2021  ? Carotid artery occlusion   ? DDD (degenerative disc disease)   ? Depression   ? DVT (deep venous thrombosis) ([facility]) 2008  ? right leg  ? Hypertension   ? Thyroid disease   ? ? ?Past Surgical History:  ?Procedure Laterality Date  ? CESAREAN SECTION    ? CORONARY ARTERY BYPASS GRAFT N/A 04/05/2021  ? Procedure: CORONARY ARTERY BYPASS GRAFTING (CABG) x3, Using Left Internal Mammory Artery and Right Leg greater saphenous vein harvested endoscopically;  Surgeon: Dahlia Byes, MD;  Location: [facility];  Service: Open Heart Surgery;  Laterality: N/A;  ? DILATION AND CURETTAGE OF UTERUS    ? LEFT HEART CATH AND CORONARY ANGIOGRAPHY N/A 04/03/2021  ? Procedure: LEFT HEART CATH AND CORONARY ANGIOGRAPHY;  Surgeon: Jettie Booze, MD;  Location: [facility];  Service: Cardiovascular;  Laterality: N/A;  ? SHOULDER ARTHROSCOPY Left 2005  ? TEE WITHOUT CARDIOVERSION N/A 04/05/2021  ? Procedure: TRANSESOPHAGEAL ECHOCARDIOGRAM (TEE);  Surgeon: Dahlia Byes, MD;  Location: [facility];  Service: Open Heart Surgery;  Laterality: N/A;  ?  ? ?Medications Prior to Admission: ?Prior to Admission medications   ?Medication Sig Start Date End Date Taking? Authorizing Provider  ?acetaminophen (TYLENOL) 325 MG tablet Take 2 tablets (650 mg total) by mouth every 6 (six) hours as needed for mild pain or moderate pain. 04/10/21  Yes [name], [name], PA-C  ?albuterol (VENTOLIN HFA) 108 (90 Base) MCG/ACT inhaler Inhale 2 puffs into the lungs daily as needed for shortness of breath. 01/06/21  Yes [provider]  ?amiodarone (PACERONE) 200 MG tablet Take 2 tablets (400 mg total) by mouth 2 (two) times daily. For 5 days, then decrease to 200 mg BID x 7 days, then decrease to  200 mg daily 04/10/21  Yes [name], [name], PA-C  ?aspirin EC 81 MG tablet Take 81 mg by mouth daily.   Yes [provider]  ?buPROPion (WELLBUTRIN SR) 150 MG 12 hr tablet Take 1 tablet by mouth daily. 02/15/15  Yes [provider]  ?butalbital-acetaminophen-caffeine (FIORICET, ESGIC) 50-325-40 MG tablet Take 1 tablet by mouth every 4 (four) hours as needed for headache or migraine. 03/22/15  Yes [provider]  ?celecoxib (CELEBREX) 100 MG capsule Take 100 mg by mouth 2 (two) times daily as needed for mild pain. 06/05/19  Yes [provider]  ?clopidogrel (PLAVIX) 75 MG tablet Take 1 tablet (75 mg total) by mouth daily. 04/10/21 04/10/22 Yes [name], [name], PA-C  ?ESTRING 2 MG vaginal ring Place 2 mg vaginally every 3 (three) months. 04/11/21  Yes [provider]  ?Evolocumab with Infusor ([place]) 420 MG/3.5ML SOCT Inject 420 mg into the skin every 14 (fourteen) days.   Yes [provider]  ?fluticasone furoate-vilanterol (BREO ELLIPTA) 200-25 MCG/ACT AEPB Inhale 1 puff into the lungs daily. 03/15/21  Yes Mannam, Praveen, MD  ?furosemide (LASIX) 40 MG tablet Take 1 tablet (40 mg total) by mouth daily as needed. Please take on days you have a weight gain of 3 lbs ?Patient taking differently: Take 40 mg by mouth daily as needed for fluid. Please take on days you have a weight gain of 3 lbs 04/10/21  Yes [name], [name], PA-C  ?HYDROcodone-acetaminophen (NORCO) 10-325 MG per tablet Take 1 tablet by mouth every 4 (four) hours as needed for moderate pain. Reported on 07/05/2015 09/07/13  Yes [provider]  ?levothyroxine (SYNTHROID) 150 MCG tablet Take 150 mcg by mouth daily. 03/06/21  Yes [provider]  ?methocarbamol (ROBAXIN) 500 MG tablet Take 1 tablet (500 mg total) by mouth 4 (four) times daily. 09/11/13  Yes [name], PA-C  ?Metoprolol Tartrate 37.5 MG TABS Take 37.5 mg by mouth 2 (two) times daily. 04/10/21  Yes [name],  [name], PA-C  ?omeprazole (PRILOSEC) 40 MG capsule Take 1 capsule (40 mg total) by mouth daily. 03/15/21  Yes Mannam, Praveen, MD  ?oxyCODONE (OXY IR/ROXICODONE) 5 MG immediate release tablet Take 15 mg by m

## 2021-04-22 NOTE — ED Notes (Signed)
Pt started on NS bolus d/t low BP per EDP approval.  ?

## 2021-04-22 NOTE — Progress Notes (Signed)
?  Echocardiogram ?2D Echocardiogram has been performed. ? ?Samantha Osborne ?2021-04-29, 9:46 AM ?

## 2021-04-22 NOTE — ED Notes (Signed)
RN notified Samantha Kaiser MD: ? ?Pt received 60 mg Lasix at 452. Pt has 40 mg Lasix due at 0800. RN verifying if second dose needed at this time. ? ?Pt stated she has had a hard time sleeping. RN requested if a sleep aid can be ordered. ? ?Pt is NPO. RN messaged about clarification with medications with sips of water is appropriate.  ?

## 2021-04-22 NOTE — Progress Notes (Signed)
OT Cancellation Note ? ?Patient Details ?Name: DHWANI VENKATESH ?MRN: [id number] ?DOB: 09-09-1955 ? ? ?Cancelled Treatment:    Reason Eval/Treat Not Completed: Patient at procedure or test/ unavailable (Cath lab. Will return as schedule allows.) ? ?Shardai Star M Marylen Zuk ?Sherrye Puga MSOT, OTR/L ?Acute Rehab ?Pager: [phone] ?Office: [phone] ?April 25, 2021, 2:24 PM ?

## 2021-04-22 NOTE — ED Notes (Signed)
Sarajane Jews PA notified regarding pts critical troponin 3677 ?

## 2021-04-22 NOTE — Progress Notes (Addendum)
ANTICOAGULATION CONSULT NOTE ? ?Pharmacy Consult for heparin ?Indication: chest pain/ACS ? ?Heparin Dosing Weight: 56.2 kg ? ?Labs: ?Recent Labs  ?  05/09/21 ?0225 05/09/2021 ?0429  ?HGB 9.6*  --   ?HCT 29.1*  --   ?PLT 285  --   ?CREATININE 1.75*  --   ?TROPONINIHS 2,513* 3,677*  ? ? ?Estimated Creatinine Clearance: 25.8 mL/min (A) (by C-G formula based on SCr of 1.75 mg/dL (H)). ? ?Assessment: ?21 yof with hx DVT in 2008 and recent CABG on DAPT PTA. Patient had post-op afib but is not on anticoagulation PTA presenting with CP and SOB. Pharmacy consulted to dose heparin. CBC stable from previous. Noted AKI - SCr up to 1.75 today. No active bleed issues documented. ? ?Patient previously ordered heparin 5000 units SQ q8h on presentation - 1st dose given this AM at 0615. ? ?Goal of Therapy:  ?Heparin level 0.3-0.7 units/ml ?Monitor platelets by anticoagulation protocol: Yes ?  ?Plan:  ?D/c heparin SQ ?Heparin 1500 unit bolus - smaller bolus due to proximity to heparin SQ dose this morning ?Start heparin at 700 units/hr ?8hr heparin level ?Monitor daily CBC, s/sx bleeding ?F/u Cardiology plans - may need cath depending on ECHO results per notes ? ? ?Arturo Morton, PharmD, BCPS ?Please check AMION for all [facility] contact numbers ?Clinical Pharmacist ?05/09/2021 7:31 AM ?

## 2021-04-22 NOTE — Progress Notes (Signed)
? ?Progress Note ? ?Patient Name: Samantha Osborne ?Date of Encounter: 08-May-2021 ? ?[facility] Cardiologist: Carlyle Dolly, MD  ? ?Subjective  ? ?Feeling worse, jaw pain ? ?Inpatient Medications  ?  ?Scheduled Meds: ? amiodarone  400 mg Oral BID  ? aspirin EC  81 mg Oral Daily  ? buPROPion  150 mg Oral Daily  ? clopidogrel  75 mg Oral Daily  ? fluticasone furoate-vilanterol  1 puff Inhalation Daily  ? furosemide  40 mg Intravenous Q12H  ? levothyroxine  150 mcg Oral Daily  ? methocarbamol  500 mg Oral QID  ? metoprolol tartrate  37.5 mg Oral BID  ? pantoprazole  40 mg Oral Daily  ? potassium chloride SA  20 mEq Oral Daily  ? sodium chloride flush  3 mL Intravenous Q12H  ? ?Continuous Infusions: ? sodium chloride    ? heparin 700 Units/hr (May 08, 2021 0931)  ? ?PRN Meds: ?sodium chloride, acetaminophen, albuterol, celecoxib, HYDROcodone-acetaminophen, nitroGLYCERIN, ondansetron (ZOFRAN) IV, oxyCODONE, perflutren lipid microspheres (DEFINITY) IV suspension, sodium chloride flush  ? ?Vital Signs  ?  ?Vitals:  ? 2021/05/08 0500 2021/05/08 0530 08-May-2021 0700 08-May-2021 1011  ?BP: (!) 131/93 (!) 139/91 107/80 115/89  ?Pulse: (!) 106 (!) 114 (!) 103 (!) 120  ?Resp: (!) 21 (!) 22 (!) 22 (!) 28  ?Temp:      ?TempSrc:      ?SpO2: 97% 96% 96% 98%  ?Weight:      ?Height:      ? ? ?Intake/Output Summary (Last 24 hours) at 2021/05/08 1105 ?Last data filed at 08-May-2021 1572 ?Gross per 24 hour  ?Intake --  ?Output 600 ml  ?Net -600 ml  ? ? ?  May 08, 2021  ?  2:18 AM 04/10/2021  ?  4:50 AM 04/08/2021  ?  5:00 AM  ?Last 3 Weights  ?Weight (lbs) 124 lb 136 lb 0.4 oz 136 lb 11 oz  ?Weight (kg) 56.246 kg 61.7 kg 62 kg  ?   ?Physical Exam  ? ?Ill-appearing, diaphoretic, tachycardic, mild rales, protuberant abdomen, cool extremities, moaning, husband at bedside ? ?Labs  ?  ?High Sensitivity Troponin:   ?Recent Labs  ?Lab 04/02/21 ?6203 04/03/21 ?5597 05-08-2021 ?0225 2021-05-08 ?4163 May 08, 2021 ?8453  ?TROPONINIHS 51* 50* 2,513* 3,677* 8,290*  ?    ?Chemistry ?Recent Labs  ?Lab 04/06/21 ?1700 04/07/21 ?0435 04/09/21 ?0131 04/10/21 ?0433 05/08/21 ?0225  ?NA 133*   < > 134* 134* 134*  ?K 4.6   < > 4.2 4.4 4.4  ?CL 99   < > 99 99 102  ?CO2 24   < > 27 26 21*  ?GLUCOSE 128*   < > 111* 113* 147*  ?BUN 16   < > '20 19 22  '$ ?CREATININE 1.38*   < > 1.60* 1.51* 1.75*  ?CALCIUM 8.3*   < > 8.2* 8.4* 8.6*  ?MG 2.1  --   --   --   --   ?PROT  --   --   --   --  6.7  ?ALBUMIN  --   --   --   --  3.1*  ?AST  --   --   --   --  34  ?ALT  --   --   --   --  23  ?ALKPHOS  --   --   --   --  88  ?BILITOT  --   --   --   --  0.8  ?GFRNONAA 42*   < >  35* 38* 32*  ?ANIONGAP 10   < > '8 9 11  '$ ? < > = values in this interval not displayed.  ?  ?Lipids No results for input(s): CHOL, TRIG, HDL, LABVLDL, LDLCALC, CHOLHDL in the last 168 hours.  ?Hematology ?Recent Labs  ?Lab 04/09/21 ?0131 04/10/21 ?0433 Apr 22, 2021 ?0225  ?WBC 14.7* 15.5* 16.0*  ?RBC 2.90* 3.25* 3.05*  ?HGB 9.2* 10.1* 9.6*  ?HCT 27.1* 30.9* 29.1*  ?MCV 93.4 95.1 95.4  ?MCH 31.7 31.1 31.5  ?MCHC 33.9 32.7 33.0  ?RDW 15.0 14.8 15.0  ?PLT 149* 209 285  ? ?Thyroid No results for input(s): TSH, FREET4 in the last 168 hours.  ?BNP ?Recent Labs  ?Lab Apr 22, 2021 ?0225  ?BNP 1,651.4*  ?  ?DDimer No results for input(s): DDIMER in the last 168 hours.  ? ?Radiology  ?  ?DG Chest Port 1 View ? ?Result Date: April 22, 2021 ?CLINICAL DATA:  Chest pain EXAM: PORTABLE CHEST 1 VIEW COMPARISON:  04/09/2021 FINDINGS: Multifocal patchy/interstitial opacities, left lung predominant, favoring chronic interstitial lung disease when correlating with prior studies. Superimposed pneumonia is possible but unlikely. No pleural effusion or pneumothorax. The heart is top-normal in size. Postsurgical changes related to prior CABG. Median sternotomy. IMPRESSION: Chronic interstitial lung disease, unchanged. Electronically Signed   By: Julian Hy M.D.   On: April 22, 2021 04:15   ? ? ? ?Patient Profile  ?   ?66 y.o. female with recent CABG, LIMA to LAD, SVG to  OM, SVG to PDA and here with what appears to be cardiogenic shock, widened QRS. ? ? ?Assessment & Plan  ?  ?Cardiogenic shock/CAD/recent bypass surgery ?- Currently gravely ill in the emergency department, diaphoretic, cool extremities.  Appears to be in cardiogenic shock.  Tachycardic with widened QRS complex.  Wonder if there is a component of acidosis as well.  Echocardiogram has been personally reviewed, ejection fraction has decreased to approximately 15%.  Postop TEE 45% ?-Cardiac catheterization diagnostic also personally reviewed with Dr. Martin[place] of interventional cardiology.  Previous severe left main disease.  Fairly small circumflex and distal RCA system. ?-Does have chronic back pain.  Husband present in the emergency department.  Did ask if she could get something for her pain, explained that in this setting, her blood pressures are being drastically affected by morphine. ? ?Acute kidney injury ?- Creatinine 1.7, was 1.5 approximately on discharge.  Likely related to underlying cardiogenic shock. ? ?Postop atrial fibrillation ?- On amiodarone ? ?Former smoker ? ?Chronic pain syndrome ?- Chronic back pain on opiates at home. ? ?Interstitial lung disease ?- Follows with pulmonary.  Does not appear to be an active issue. ? ?CRITICAL CARE ?Performed by: Candee Furbish ? ? ?Total critical care time: 50 minutes ? ?Critical care time was exclusive of separately billable procedures and treating other patients. ? ?Critical care was necessary to treat or prevent imminent or life-threatening deterioration. ? ?Critical care was time spent personally by me on the following activities: development of treatment plan with patient and/or surrogate as well as nursing, discussions with consultants, evaluation of patient's response to treatment, examination of patient, obtaining history from patient or surrogate, ordering and performing treatments and interventions, ordering and review of laboratory studies, ordering and  review of radiographic studies, pulse oximetry and re-evaluation of patient's condition.  ?For questions or updates, please contact [place] ?Please consult [email or web address] for contact info under  ? ?  ?   ?Signed, ?Candee Furbish, MD  ?22-Apr-2021, 11:05 AM   ? ?

## 2021-04-22 DEATH — deceased

## 2021-04-25 ENCOUNTER — Ambulatory Visit: Payer: Medicare Other | Admitting: Nurse Practitioner

## 2021-05-01 ENCOUNTER — Ambulatory Visit: Payer: Medicare Other | Admitting: Cardiothoracic Surgery

## 2021-06-05 ENCOUNTER — Other Ambulatory Visit (HOSPITAL_COMMUNITY): Payer: Medicare Other

## 2021-06-12 ENCOUNTER — Ambulatory Visit: Payer: Medicare Other | Admitting: Pulmonary Disease

## 2021-08-02 ENCOUNTER — Ambulatory Visit: Payer: Medicare Other | Admitting: Cardiology
